# Patient Record
Sex: Female | Born: 1970 | Race: White | Hispanic: No | Marital: Single | Smoking: Former smoker
Health system: Southern US, Community
[De-identification: ages and names within clinical notes are randomized; demographics above are authoritative.]

## PROBLEM LIST (undated history)

## (undated) ENCOUNTER — Emergency Department (HOSPITAL_COMMUNITY): Payer: Medicare HMO | Source: Home / Self Care

## (undated) DIAGNOSIS — M797 Fibromyalgia: Secondary | ICD-10-CM

## (undated) DIAGNOSIS — M1990 Unspecified osteoarthritis, unspecified site: Secondary | ICD-10-CM

## (undated) DIAGNOSIS — R51 Headache: Secondary | ICD-10-CM

## (undated) DIAGNOSIS — F4320 Adjustment disorder, unspecified: Secondary | ICD-10-CM

## (undated) DIAGNOSIS — N840 Polyp of corpus uteri: Secondary | ICD-10-CM

## (undated) DIAGNOSIS — Z3490 Encounter for supervision of normal pregnancy, unspecified, unspecified trimester: Secondary | ICD-10-CM

## (undated) DIAGNOSIS — J45909 Unspecified asthma, uncomplicated: Secondary | ICD-10-CM

## (undated) HISTORY — PX: OTHER SURGICAL HISTORY: SHX169

## (undated) HISTORY — PX: WISDOM TOOTH EXTRACTION: SHX21

## (undated) HISTORY — DX: Polyp of corpus uteri: N84.0

---

## 2001-05-25 ENCOUNTER — Encounter: Payer: Self-pay | Admitting: Orthopedic Surgery

## 2001-05-25 ENCOUNTER — Ambulatory Visit (HOSPITAL_COMMUNITY): Admission: RE | Admit: 2001-05-25 | Discharge: 2001-05-25 | Payer: Self-pay | Admitting: Orthopedic Surgery

## 2001-06-10 ENCOUNTER — Encounter (HOSPITAL_COMMUNITY): Admission: RE | Admit: 2001-06-10 | Discharge: 2001-07-10 | Payer: Self-pay | Admitting: Rheumatology

## 2001-08-05 ENCOUNTER — Encounter (HOSPITAL_COMMUNITY): Admission: RE | Admit: 2001-08-05 | Discharge: 2001-09-04 | Payer: Self-pay | Admitting: Rheumatology

## 2001-10-01 ENCOUNTER — Emergency Department (HOSPITAL_COMMUNITY): Admission: EM | Admit: 2001-10-01 | Discharge: 2001-10-01 | Payer: Self-pay | Admitting: Emergency Medicine

## 2001-11-25 ENCOUNTER — Other Ambulatory Visit: Admission: RE | Admit: 2001-11-25 | Discharge: 2001-11-25 | Payer: Self-pay | Admitting: Obstetrics and Gynecology

## 2002-02-05 ENCOUNTER — Ambulatory Visit (HOSPITAL_COMMUNITY): Admission: AD | Admit: 2002-02-05 | Discharge: 2002-02-05 | Payer: Self-pay | Admitting: Obstetrics and Gynecology

## 2002-03-03 ENCOUNTER — Encounter (HOSPITAL_COMMUNITY): Admission: RE | Admit: 2002-03-03 | Discharge: 2002-04-02 | Payer: Self-pay | Admitting: Rheumatology

## 2002-04-28 ENCOUNTER — Inpatient Hospital Stay (HOSPITAL_COMMUNITY): Admission: AD | Admit: 2002-04-28 | Discharge: 2002-05-02 | Payer: Self-pay | Admitting: Obstetrics and Gynecology

## 2002-05-04 ENCOUNTER — Ambulatory Visit (HOSPITAL_COMMUNITY): Admission: RE | Admit: 2002-05-04 | Discharge: 2002-05-04 | Payer: Self-pay | Admitting: Internal Medicine

## 2002-05-04 ENCOUNTER — Encounter: Payer: Self-pay | Admitting: Obstetrics and Gynecology

## 2002-06-30 ENCOUNTER — Encounter (HOSPITAL_COMMUNITY): Admission: RE | Admit: 2002-06-30 | Discharge: 2002-07-30 | Payer: Self-pay | Admitting: Rheumatology

## 2002-07-01 ENCOUNTER — Encounter: Payer: Self-pay | Admitting: Rheumatology

## 2002-11-15 ENCOUNTER — Emergency Department (HOSPITAL_COMMUNITY): Admission: EM | Admit: 2002-11-15 | Discharge: 2002-11-15 | Payer: Self-pay | Admitting: *Deleted

## 2003-01-14 ENCOUNTER — Emergency Department (HOSPITAL_COMMUNITY): Admission: EM | Admit: 2003-01-14 | Discharge: 2003-01-14 | Payer: Self-pay | Admitting: Emergency Medicine

## 2003-06-19 ENCOUNTER — Emergency Department (HOSPITAL_COMMUNITY): Admission: EM | Admit: 2003-06-19 | Discharge: 2003-06-19 | Payer: Self-pay | Admitting: Emergency Medicine

## 2003-11-10 ENCOUNTER — Emergency Department (HOSPITAL_COMMUNITY): Admission: EM | Admit: 2003-11-10 | Discharge: 2003-11-10 | Payer: Self-pay | Admitting: Emergency Medicine

## 2003-11-25 ENCOUNTER — Emergency Department (HOSPITAL_COMMUNITY): Admission: EM | Admit: 2003-11-25 | Discharge: 2003-11-26 | Payer: Self-pay | Admitting: Emergency Medicine

## 2004-07-11 ENCOUNTER — Inpatient Hospital Stay (HOSPITAL_COMMUNITY): Admission: RE | Admit: 2004-07-11 | Discharge: 2004-07-14 | Payer: Self-pay | Admitting: Obstetrics and Gynecology

## 2005-03-01 ENCOUNTER — Emergency Department (HOSPITAL_COMMUNITY): Admission: EM | Admit: 2005-03-01 | Discharge: 2005-03-01 | Payer: Self-pay | Admitting: Emergency Medicine

## 2005-07-01 ENCOUNTER — Emergency Department (HOSPITAL_COMMUNITY): Admission: EM | Admit: 2005-07-01 | Discharge: 2005-07-01 | Payer: Self-pay | Admitting: Emergency Medicine

## 2006-05-18 ENCOUNTER — Emergency Department (HOSPITAL_COMMUNITY): Admission: EM | Admit: 2006-05-18 | Discharge: 2006-05-18 | Payer: Self-pay | Admitting: Emergency Medicine

## 2007-08-11 ENCOUNTER — Other Ambulatory Visit: Admission: RE | Admit: 2007-08-11 | Discharge: 2007-08-11 | Payer: Self-pay | Admitting: Obstetrics & Gynecology

## 2007-09-02 ENCOUNTER — Emergency Department (HOSPITAL_COMMUNITY): Admission: EM | Admit: 2007-09-02 | Discharge: 2007-09-02 | Payer: Self-pay | Admitting: Emergency Medicine

## 2007-10-29 ENCOUNTER — Emergency Department (HOSPITAL_COMMUNITY): Admission: EM | Admit: 2007-10-29 | Discharge: 2007-10-29 | Payer: Self-pay | Admitting: Emergency Medicine

## 2007-11-09 ENCOUNTER — Emergency Department (HOSPITAL_COMMUNITY): Admission: EM | Admit: 2007-11-09 | Discharge: 2007-11-09 | Payer: Self-pay | Admitting: Emergency Medicine

## 2007-11-15 ENCOUNTER — Other Ambulatory Visit: Admission: RE | Admit: 2007-11-15 | Discharge: 2007-11-15 | Payer: Self-pay | Admitting: Obstetrics & Gynecology

## 2008-05-29 ENCOUNTER — Ambulatory Visit: Payer: Self-pay | Admitting: Obstetrics & Gynecology

## 2008-05-29 ENCOUNTER — Inpatient Hospital Stay (HOSPITAL_COMMUNITY): Admission: RE | Admit: 2008-05-29 | Discharge: 2008-06-02 | Payer: Self-pay | Admitting: Obstetrics & Gynecology

## 2008-05-29 ENCOUNTER — Encounter: Payer: Self-pay | Admitting: Obstetrics & Gynecology

## 2008-06-02 ENCOUNTER — Ambulatory Visit: Payer: Self-pay | Admitting: Vascular Surgery

## 2008-06-02 ENCOUNTER — Encounter: Payer: Self-pay | Admitting: Obstetrics & Gynecology

## 2008-06-09 ENCOUNTER — Encounter: Admission: RE | Admit: 2008-06-09 | Discharge: 2008-07-09 | Payer: Self-pay | Admitting: Obstetrics & Gynecology

## 2008-07-10 ENCOUNTER — Encounter: Admission: RE | Admit: 2008-07-10 | Discharge: 2008-08-09 | Payer: Self-pay | Admitting: Obstetrics & Gynecology

## 2008-07-23 ENCOUNTER — Emergency Department (HOSPITAL_COMMUNITY): Admission: EM | Admit: 2008-07-23 | Discharge: 2008-07-23 | Payer: Self-pay | Admitting: Emergency Medicine

## 2008-08-10 ENCOUNTER — Encounter: Admission: RE | Admit: 2008-08-10 | Discharge: 2008-09-08 | Payer: Self-pay | Admitting: Obstetrics & Gynecology

## 2008-09-09 ENCOUNTER — Encounter: Admission: RE | Admit: 2008-09-09 | Discharge: 2008-10-09 | Payer: Self-pay | Admitting: Obstetrics & Gynecology

## 2008-10-10 ENCOUNTER — Encounter: Admission: RE | Admit: 2008-10-10 | Discharge: 2008-11-09 | Payer: Self-pay | Admitting: Obstetrics & Gynecology

## 2009-02-05 ENCOUNTER — Inpatient Hospital Stay (HOSPITAL_COMMUNITY): Admission: AD | Admit: 2009-02-05 | Discharge: 2009-02-05 | Payer: Self-pay | Admitting: Obstetrics & Gynecology

## 2009-03-30 ENCOUNTER — Other Ambulatory Visit: Admission: RE | Admit: 2009-03-30 | Discharge: 2009-03-30 | Payer: Self-pay | Admitting: Obstetrics and Gynecology

## 2009-05-18 ENCOUNTER — Inpatient Hospital Stay (HOSPITAL_COMMUNITY): Admission: RE | Admit: 2009-05-18 | Discharge: 2009-05-21 | Payer: Self-pay | Admitting: Obstetrics and Gynecology

## 2009-10-30 ENCOUNTER — Emergency Department (HOSPITAL_COMMUNITY): Admission: EM | Admit: 2009-10-30 | Discharge: 2009-10-30 | Payer: Self-pay | Admitting: Emergency Medicine

## 2009-11-06 ENCOUNTER — Inpatient Hospital Stay (HOSPITAL_COMMUNITY): Admission: EM | Admit: 2009-11-06 | Discharge: 2009-11-09 | Payer: Self-pay | Admitting: Emergency Medicine

## 2009-11-30 ENCOUNTER — Emergency Department (HOSPITAL_COMMUNITY): Admission: EM | Admit: 2009-11-30 | Discharge: 2009-11-30 | Payer: Self-pay | Admitting: Emergency Medicine

## 2010-01-16 ENCOUNTER — Emergency Department (HOSPITAL_COMMUNITY): Admission: EM | Admit: 2010-01-16 | Discharge: 2010-01-16 | Payer: Self-pay | Admitting: Emergency Medicine

## 2010-04-01 ENCOUNTER — Emergency Department (HOSPITAL_COMMUNITY): Admission: EM | Admit: 2010-04-01 | Discharge: 2010-04-01 | Payer: Self-pay | Admitting: Emergency Medicine

## 2010-07-21 ENCOUNTER — Emergency Department (HOSPITAL_COMMUNITY): Admission: EM | Admit: 2010-07-21 | Discharge: 2010-07-22 | Payer: Self-pay | Admitting: Emergency Medicine

## 2010-08-27 ENCOUNTER — Emergency Department (HOSPITAL_COMMUNITY): Admission: EM | Admit: 2010-08-27 | Discharge: 2010-08-27 | Payer: Self-pay | Admitting: Emergency Medicine

## 2010-09-13 ENCOUNTER — Emergency Department (HOSPITAL_COMMUNITY)
Admission: EM | Admit: 2010-09-13 | Discharge: 2010-09-13 | Payer: Self-pay | Source: Home / Self Care | Admitting: Emergency Medicine

## 2010-12-16 LAB — CBC
HCT: 40.1 % (ref 36.0–46.0)
MCH: 30.2 pg (ref 26.0–34.0)
MCV: 87 fL (ref 78.0–100.0)
Platelets: 165 10*3/uL (ref 150–400)
RDW: 12.6 % (ref 11.5–15.5)

## 2010-12-16 LAB — DIFFERENTIAL
Basophils Absolute: 0 10*3/uL (ref 0.0–0.1)
Eosinophils Absolute: 0.2 10*3/uL (ref 0.0–0.7)
Eosinophils Relative: 4 % (ref 0–5)
Lymphs Abs: 1.6 10*3/uL (ref 0.7–4.0)
Monocytes Absolute: 0.4 10*3/uL (ref 0.1–1.0)

## 2010-12-16 LAB — BASIC METABOLIC PANEL
BUN: 5 mg/dL — ABNORMAL LOW (ref 6–23)
CO2: 29 mEq/L (ref 19–32)
Chloride: 104 mEq/L (ref 96–112)
Creatinine, Ser: 0.72 mg/dL (ref 0.4–1.2)
Potassium: 4 mEq/L (ref 3.5–5.1)

## 2010-12-18 LAB — RAPID STREP SCREEN (MED CTR MEBANE ONLY): Streptococcus, Group A Screen (Direct): NEGATIVE

## 2010-12-25 LAB — BASIC METABOLIC PANEL
BUN: 7 mg/dL (ref 6–23)
BUN: 9 mg/dL (ref 6–23)
CO2: 27 mEq/L (ref 19–32)
CO2: 27 mEq/L (ref 19–32)
Chloride: 103 mEq/L (ref 96–112)
Chloride: 107 mEq/L (ref 96–112)
Creatinine, Ser: 0.69 mg/dL (ref 0.4–1.2)
Creatinine, Ser: 0.75 mg/dL (ref 0.4–1.2)
GFR calc Af Amer: >60 mL/min (ref >60)
Glucose, Bld: 101 mg/dL — ABNORMAL HIGH (ref 70–99)
Glucose, Bld: 98 mg/dL (ref 70–99)
Potassium: 3.6 mEq/L (ref 3.5–5.1)
Potassium: 3.8 mEq/L (ref 3.5–5.1)
Sodium: 137 mEq/L (ref 135–145)
Sodium: 140 mEq/L (ref 135–145)

## 2010-12-25 LAB — CBC
HCT: 36.5 % (ref 36.0–46.0)
HCT: 42.6 % (ref 36.0–46.0)
Hemoglobin: 11.9 g/dL — ABNORMAL LOW (ref 12.0–15.0)
Hemoglobin: 12.4 g/dL (ref 12.0–15.0)
MCHC: 33.8 g/dL (ref 30.0–36.0)
MCHC: 34 g/dL (ref 30.0–36.0)
MCV: 88.2 fL (ref 78.0–100.0)
MCV: 88.4 fL (ref 78.0–100.0)
Platelets: 174 10*3/uL (ref 150–400)
Platelets: 176 10*3/uL (ref 150–400)
RBC: 3.98 MIL/uL (ref 3.87–5.11)
RDW: 11.9 % (ref 11.5–15.5)
RDW: 12.2 % (ref 11.5–15.5)
WBC: 6.2 10*3/uL (ref 4.0–10.5)
WBC: 6.6 10*3/uL (ref 4.0–10.5)

## 2010-12-25 LAB — DIFFERENTIAL
Basophils Absolute: 0 10*3/uL (ref 0.0–0.1)
Basophils Absolute: 0 10*3/uL (ref 0.0–0.1)
Basophils Relative: 0 % (ref 0–1)
Basophils Relative: 1 % (ref 0–1)
Eosinophils Absolute: 0.1 10*3/uL (ref 0.0–0.7)
Eosinophils Absolute: 0.1 10*3/uL (ref 0.0–0.7)
Eosinophils Absolute: 0.1 10*3/uL (ref 0.0–0.7)
Eosinophils Relative: 1 % (ref 0–5)
Eosinophils Relative: 2 % (ref 0–5)
Lymphs Abs: 1.9 10*3/uL (ref 0.7–4.0)
Lymphs Abs: 2.3 10*3/uL (ref 0.7–4.0)
Monocytes Absolute: 0.3 10*3/uL (ref 0.1–1.0)
Monocytes Absolute: 0.3 10*3/uL (ref 0.1–1.0)
Monocytes Relative: 4 % (ref 3–12)
Neutro Abs: 4 10*3/uL (ref 1.7–7.7)
Neutrophils Relative %: 59 % (ref 43–77)
Neutrophils Relative %: 64 % (ref 43–77)

## 2010-12-25 LAB — RAPID URINE DRUG SCREEN, HOSP PERFORMED
Barbiturates: NOT DETECTED
Benzodiazepines: POSITIVE — AB
Cocaine: NOT DETECTED
Opiates: POSITIVE — AB

## 2010-12-25 LAB — CULTURE, BLOOD (ROUTINE X 2): Report Status: 2062011

## 2010-12-25 LAB — CULTURE, ROUTINE-ABSCESS: Culture: NO GROWTH

## 2011-01-11 LAB — CBC
MCHC: 34.3 g/dL (ref 30.0–36.0)
MCV: 89.8 fL (ref 78.0–100.0)
Platelets: 105 10*3/uL — ABNORMAL LOW (ref 150–400)
Platelets: 135 10*3/uL — ABNORMAL LOW (ref 150–400)
RBC: 3.84 MIL/uL — ABNORMAL LOW (ref 3.87–5.11)
RDW: 13 % (ref 11.5–15.5)
WBC: 8.4 10*3/uL (ref 4.0–10.5)

## 2011-01-11 LAB — TYPE AND SCREEN
ABO/RH(D): O NEG
DAT, IgG: NEGATIVE

## 2011-01-11 LAB — RPR: RPR Ser Ql: NONREACTIVE

## 2011-01-14 LAB — URINALYSIS, ROUTINE W REFLEX MICROSCOPIC
Glucose, UA: NEGATIVE mg/dL
Ketones, ur: NEGATIVE mg/dL
Nitrite: NEGATIVE
pH: 8 (ref 5.0–8.0)

## 2011-02-18 NOTE — Discharge Summary (Signed)
[name], [name]                 ACCOUNT NO.:  [id number]   MEDICAL RECORD NO.:  [id number]          PATIENT TYPE:  INP   LOCATION:  9139                          [facility]   PHYSICIAN:  Norton Blizzard, MD    DATE OF BIRTH:  January 22, 1971   DATE OF ADMISSION:  05/29/2008  DATE OF DISCHARGE:  06/02/2008                               DISCHARGE SUMMARY   REASON FOR ADMISSION:  Cesarean section.   DISCHARGE DIAGNOSIS:  Delivery.   PROCEDURES DONE:  Prenatal nonstress test and ultrasound.   PROCEDURES DONE INTRAPARTUM:  Low-transverse cesarean section.   POSTPARTUM PROCEDURES:  06/02/08 Bilateral lower extremity Doppler  ultrasound which were negative for DVT.   COMPLICATIONS:  Operative and postpartum: the patient had incisional  site cellulitis.   DISCHARGE DIAGNOSIS:  Term pregnancy, delivered.   BRIEF HOSPITAL COURSE:  This is a 40 year old G5, P3-0-2-3 at 39-week  gestational age, who presents for elective repeat low-transverse  cesarean section.  The patient delivered a viable female infant with  Apgars 8/9.  The patient developed incisional cellulitis on postop day  #2, treated with clindamycin 900 mg IV q.8 h. for 2 days.  Cellulitis  responded to antibiotic treatment with less edema and erythema.  On  discharge day, there was no observed erythema, discharge or induration.  Patient is to continue on antibiotics, Keflex 500 mg p.o., b.i.d. x 10  days; given a script of 20 tablets.  On postop day #3, the patient  developed right lower leg edema with positive Homans sign.  The patient  was put on treatment dose of Lovenox 150 mg subcu x1.  A bilateral lower  extremity Doppler ultrasound was done the next day which ruled out DVT.  Lovenox was discontinued.  The patient desired to be discharged on  postop day #4.  Once the DVT was ruled out and the patient was  hemodynamically stable, we thought it was prudent to grant the patient's  wish to discharge home.  The patient was  discharged home on postop day  #4 with instructions to return to [facility] if she has fever or  worsening pain in the right lower extremity.   DISCHARGE INFORMATION:   ACTIVITY:  Pelvic rest x6 weeks.   DIET:  Routine.   DISCHARGE MEDICATIONS:  1. Percocet 5/325 take 1-2 tablets q.4-6 h. p.r.n. pain, dispensed 20      tablets, no refills.  2. Ibuprofen 600 mg take 1 p.o., q.6 h. p.r.n. pain, dispensed 30, no      refills.  3. Keflex 500 mg p.o., b.i.d. x10 days, dispensed 20 tablets, no      refills.  4. Colace 100 mg p.o. b.i.d., p.r.n. postpartum constipation,      dispensed 30, no refills.  5. Prenatal vitamin, take 1 p.o. daily while breastfeeding, dispensed      30 with refills as needed.   STATUS:  Well.   INSTRUCTIONS:  Routine.  Discharged home.  Follow up in 3 days at [facility]  The patient has appointment on June 05, 2008, at 10:30 a.m.  NEWBORN DATA:  The patient delivered a viable female.  No circumcision at  this time.  The infant's weight was 7 pounds 11 ounces.  Newborn was  discharged home with mother with no complications.      Angeline Slim, MD  Electronically Signed     ______________________________  Norton Blizzard, MD    CT/MEDQ  D:  06/05/2008  T:  06/06/2008  Job:  [id number]

## 2011-02-18 NOTE — Op Note (Signed)
[name], [name]                 ACCOUNT NO.:  [id number]   MEDICAL RECORD NO.:  [id number]          PATIENT TYPE:  INP   LOCATION:  9139                          [facility]   PHYSICIAN:  Lazaro Arms, M.D.   DATE OF BIRTH:  1970/11/21   DATE OF PROCEDURE:  05/29/2008  DATE OF DISCHARGE:                               OPERATIVE REPORT   PREOPERATIVE DIAGNOSES:  1. Intrauterine pregnancy at [redacted] weeks gestational age.  2. History of cesarean section x2.   POSTOPERATIVE DIAGNOSES:  1. Intrauterine pregnancy at [redacted] weeks gestational age.  2. History of cesarean section x2.   PROCEDURE:  Repeat low-transverse cesarean section.   SURGEON:  Lazaro Arms, MD   ASSISTANT:  Odie Sera, DO   ANESTHESIA:  Spinal.   FINDINGS:  1. Viable female infant.  2. Clear amniotic fluid.   SPECIMENS:  Intact placenta with 3-vessel cord.   DISPOSITION:  To pathology.   INDICATIONS FOR PROCEDURE:  [name] is a 40 year old now gravida 7,  para 4-0-3-4 who presents for an elective cesarean section due to a  history of 2 prior cesarean sections.  She was previously extensively  counseled on the risks and benefits of cesarean section as well as  alternatives, and she desires to proceed with cesarean section.   DESCRIPTION OF PROCEDURE:  The patient was brought to the operating room  where spinal anesthesia was placed.  A time-out was conducted.  The  patient was prepped and draped in the usual sterile manner, and the  patient was placed in the left dorsal supine position.  Appropriate  anesthesia was confirmed.  A Pfannenstiel low-transverse incision was  made in the skin.  Incision was continued down to and through the  fascial layer with electrocautery and the fascial incision was extended  with scissors.  The fascial muscles were entered bluntly and spread.  The underlying rectus muscles were dissected up the peritoneum bluntly  as well.  A bladder blade was placed and a transverse  incision was made  in the uterus carefully through the layers of the uterine musculature  until the intact membranes were noted.  Membranes were ruptured with the  towel clamp and clear fluid was noted.  The head was delivered with the  assistance of a Kiwi vacuum.  The infant was bulb suctioned upon  delivery of the head.  The shoulders and rest of the body were delivered  in the usual manner.  A female infant was delivered with a spontaneous  cry.  Then, the cord was clamped and cut x2 and the baby was handed to  the NICU team.  The uterus was exteriorized and cleared of all clots and  debris using a moist lap.  The uterus was noted to be becoming firm.  The uterine incision was repaired with 0 Vicryl in a running locked  fashion.  A second layer of the same suture was used in an imbricating  fashion.  Excellent hemostasis was noted.  The muscular layers were  approximated loosely with 2-0 Vicryl suture.  Attention was then brought  to the fascial incision, which was closed in the usual manner with 0  Vicryl in running non-locking suture.  Hemostasis was again noted as  well as no defects noted in the fascial layer.  The skin was closed with  3-0 Vicryl in a subcuticular fashion.  Dermabond was applied over the  skin incision to better approximate the edges.  The patient tolerated  the procedure well.  Sponge, lap, instrument, and needle counts were  correct x2.  The patient was taken to the recovery room in good  condition.  Estimated blood loss was 700 mL.   COMPLICATIONS:  None immediate.      Odie Sera, DO  Electronically Signed     ______________________________  Lazaro Arms, M.D.    MC/MEDQ  D:  05/29/2008  T:  05/30/2008  Job:  [id number]

## 2011-02-18 NOTE — Discharge Summary (Signed)
[name], [name]                 ACCOUNT NO.:  [id number]   MEDICAL RECORD NO.:  [id number]          PATIENT TYPE:  INP   LOCATION:  9103                          [facility]   PHYSICIAN:  Tilda Burrow, M.D. DATE OF BIRTH:  Jan 06, 1971   DATE OF ADMISSION:  05/18/2009  DATE OF DISCHARGE:  05/21/2009                               DISCHARGE SUMMARY   REASON FOR ADMISSION:  Pregnancy at 39 weeks for repeat cesarean section  not for trial of labor.   POSTOPERATIVE DIAGNOSES:  Pregnancy at 39 weeks for repeat cesarean  section not for trial of labor.   The patient had an uncomplicated repeat low transverse cesarean section  with a viable female infant with Apgars of 9 and 9 per Dr. Emelda Fear.   Hospital course has been uneventful.  The patient does complain of  insomnia and after birth cramps, she is up ambulating well, taking p.o.  fluids and solids well.  The patient gassed rectally.   PHYSICAL EXAMINATION:  Today is as follows:  VITAL SIGNS:  Stable.  HEART:  Regular to rhythm and rate.  LUNGS:  Clear to auscultation bilaterally.  ABDOMEN:  Soft and nontender.  Bowel sounds are present x4.  Incision is  intact.  There is some erythema approximately 1.5 cm above the incision  with no warmth to touch.  EXTREMITIES:  No edema in the lower extremities.  GENITOURINARY:  Her fundus is firm and lochia small amount.   She [place] not had lengthy conversation in regards her discharge  medications.  She is requesting Ambien for sleep, due to the fact she  states she has not slept since she has been delivered.  Also, she states  that she will need a stronger doses of pain medication due to the fact  it takes 2 of the Percocets to get her eased out.  Also, she had not had  a lengthy conversation, and what I am going to do today is give her 5 of  Ambien to get her through until Thursday, and she can discuss with Dr.  Emelda Fear her pain management and her management of her insomnia and  her  antianxiety agents.   DISCHARGE MEDICATIONS:  As follows,  1. Motrin 600 p.o. q.6 h. p.r.n. cramping.  2. Keflex 500 t.i.d. for 7 days.  3. Colace b.i.d.  4. Ambien 10 p.o. at bedtime p.r.n. sleep.  5. Percocet 7.5/500 one p.o. q.4 h. p.r.n. pain.   She is to follow up Thursday with Dr. Rayna Sexton office for an incision  check and a blood pressure check.  She knows if she is to have any  problems, she will call Dr. Rayna Sexton office or she can come back to  our [facility]U Unit.      Zerita Boers, Lanier Clam      Tilda Burrow, M.D.  Electronically Signed    DL/MEDQ  D:  16/07/9603  T:  05/21/2009  Job:  [id number]   cc:   Tilda Burrow, M.D.  Fax: [phone]

## 2011-02-18 NOTE — Op Note (Signed)
Jamie, Farley                 ACCOUNT NO.:  [id number]   MEDICAL RECORD NO.:  [id number]          PATIENT TYPE:  INP   LOCATION:  9103                          [facility]   PHYSICIAN:  Tilda Burrow, M.D. DATE OF BIRTH:  [birth year]   DATE OF PROCEDURE:  05/18/2009  DATE OF DISCHARGE:                               OPERATIVE REPORT   ADMITTING DIAGNOSIS:  Pregnancy at 40 weeks' gestation, prior cesarean  section x3 declining, not for trial of labor.   POSTOPERATIVE DIAGNOSIS:  Pregnancy at 40 weeks' gestation, prior  cesarean section x3 declining, not for trial of labor, delivered.   PROCEDURE:  Repeat low-transverse cervical cesarean section.   SURGEON:  Tilda Burrow, MD   ASSISTANT:  None.   ANESTHESIA:  Spinal.   COMPLICATIONS:  None.   FINDINGS:  Healthy female infant, Apgars 9 and 9, weight __________, EBL  500 mL output.   URINE OUTPUT:  200 mL intraop.   INDICATIONS:  A 40 year old multiparous vaginal delivery followed by 3  prior cesareans desiring repeat cesarean section.  She is not ready to  make decisions on permanent sterilization.   DETAILS OF SURGICAL PROCEDURE:  The patient was taken to the operating  room, time-out conducted, Cleocin administered intravenously as  prophylaxis and abdomen was prepped and draped with Foley catheter,  without latex components placed.  A Pfannenstiel type incision was  repeated with wide excision of the old skin cicatrix removing a 20 cm  long x 5 x 4 cm wide ellipse of skin and underlying fatty tissue with  sharp dissection down to the fascia and opening of the fascia  transversely with dissection of the fascia off the underlying  musculature, which was quite atrophic, where it was adherent to the  fascia.  The peritoneal cavity was opened in the midline.  The  peritoneal surfaces were quite thickened, but there were no  intraperitoneal adhesions to the anterior abdominal wall.  The incision  was opened  obliquely from right lower side at 45-degree angled cephalad  into the upper left and bladder flap developed on the lower uterine  segment.  Alexis wound retractor was in place.  Bladder flap was  developed and transverse uterine incision made, lightly discolored  amniotic fluid encountered without particulate material.  The baby was  guided into the incision, fundal pressure applied, and baby delivered  easily with vigorous crying.  Nuchal cord x1 was encountered and  released easily.  Cord was clamped, infant passed to the awaiting  pediatrician.  See his notes for further details regarding the infant.   Placenta delivered easily, spontaneously, Crede uterine massage  completing the delivery with membranes intact.  Uterine cavity was  inspected.  No membrane remnant suspected.  The uterus was then closed  with a single layer of running locking 0 chromic.  A bladder flap was  minimally reapproximated with 2 interrupted sutures of 2-0 Vicryl.  The  abdomen was inspected and removed of any fluid and blood clots and then  the anterior peritoneum reapproximated and pulled together with a series  of  interrupted 2-0 Vicryl sutures.  Point cautery was necessary on 2 or  3 spots on the peritoneal surfaces while the operators finger protected  the intraabdominal contents to complete hemostasis, then the fascia  could then be closed using running 0 Vicryl.  Subcu tissues were  mobilized inferiorly sufficiently to allow subcu reapproximation using 3  interrupted sutures of 2-0 Vicryl and subcuticular 3-0 Vicryl closure of  the skin incision completed the procedure.  Pressure dressing was placed  and the patient went to recovery room in good condition.  Sponge and  needle counts correct.      Tilda Burrow, M.D.  Electronically Signed     JVF/MEDQ  D:  05/18/2009  T:  05/19/2009  Job:  [id number]

## 2011-02-18 NOTE — H&P (Signed)
[name], [name]                 ACCOUNT NO.:  [id number]   MEDICAL RECORD NO.:  [id number]          PATIENT TYPE:  INP   LOCATION:                                [facility]   PHYSICIAN:  Tilda Burrow, M.D. DATE OF BIRTH:  1971/09/28   DATE OF ADMISSION:  05/18/2009  DATE OF DISCHARGE:                              HISTORY & PHYSICAL   ADMISSION DIAGNOSES:  Pregnancy [redacted] weeks gestation, prior cesarean  section x3, not for trial of labor.   HISTORY OF PRESENT ILLNESS:  This 40 year old female gravida 6, para 4-0-  1-4 with LMP uncertain with ultrasound assigned EDC of May 25, 2009  based on 11-week 5-day ultrasound on November 08, 2008 with corresponding  ultrasound at 21 weeks is admitted at 96 weeks' gestation by best  ultrasound criteria for repeat cesarean section.  Prior pregnancies have  resulted in vaginal delivery in 1989 and cesarean sections 2003, 2005,  and 2009.  She has not made any decisions regarding permanent  sterilization and is very ambiguous in her comments about future  contraception plans.  At one time, she discussed tubal sterilization  __if the baby was a girl___, but has apprehensions about its effect on  her body.   PAST MEDICAL HISTORY:  Positive for acid reflux, chronic vulvar  discomforts, and arthritis.  She has fibromyalgia by history.  She  smokes less than 1-pack per day.  Denies alcohol or recreational drugs.  She had cesarean x3.   Prenatal course has been notable for a low pain tolerance throughout the  pregnancy.  At one time, she was on some hydrocodone for discomforts.  Currently, she is using an abdominal binder for day to day ambulation.  Additionally review of the pregnancy has been notable for the patient  concerns over 2 small areas of chronic per irritation on her scalp which  are for possibly consistent with psoriasis.   Family history is positive for skin disorders as well as diabetes,  hypertension, and heart disease.   Additionally, prenatal course has been notable for blood type O  negative, antibody screen negative.  Urine drug screen positive for THC  initially.  Blood tests are show varicella immunity, rubella immunity.  Hemoglobin 13, hematocrit 42 initially reducing to 12.8 and 39.6 at 28  weeks.  Glucose tolerance test in 1995 and first trimester January 2008  at 28 weeks'.  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   Recently in the pregnancy, she has been taking capillary blood glucoses  and has had 2 abnormal values in the 160 range recently.  Ultrasound for  estimated fetal weight within 1 standard deviation of normal.  GC and  Chlamydia cultures are negative.  Group B strep was collected on May 07, 2009.   PHYSICAL EXAMINATION:  GENERAL:  Shows a healthy-appearing Caucasian  female alert and oriented x3.  EYES:  Pupils equal, round, and reactive.  NECK:  Supple.  CHEST:  Clear to auscultation.  ABDOMEN:  Well-healed lower abdominal  scar with perceived lower abdominal discomforts to palpation,  but no  hernias or masses present.  Term size fetus, vertex presentation  consistent with gestational age.  GC and Chlamydia cultures performed.  Cervix visually closed and posterior on speculum exam.  EXTREMITIES:  Grossly normal without cyanosis, clubbing, or edema.   ASSESSMENT:  Pregnancy at 39 weeks, repeat cesarean, and tubal ligation.   Addendum:  Allergies to PENICILLIN and ASPIRIN.   PLAN:  Repeat cesarean section May 18, 2009 at 1:30 p.m.      Tilda Burrow, M.D.  Electronically Signed     JVF/MEDQ  D:  05/07/2009  T:  05/08/2009  Job:  [id number]

## 2011-02-18 NOTE — H&P (Signed)
NAMESHARON, [name]                 ACCOUNT NO.:  [id number]   MEDICAL RECORD NO.:  [id number]          PATIENT TYPE:  [facility]   LOCATION:                                [facility]   PHYSICIAN:  Tilda Burrow, M.D. DATE OF BIRTH:  [birth year]   DATE OF ADMISSION:  05/29/2008  DATE OF DISCHARGE:                              HISTORY & PHYSICAL   ADMITTING DIAGNOSES:  Pregnancy at 43 weeks' gestation, prior C-section  x2, now for trial of labor.   HISTORY OF PRESENT ILLNESS:  This 40 year old female gravida 5, para 7-3-  0-3-3 now at 88 weeks' gestation with [facility] June 06, 2008 and June 02, 2008, based on a 21-week ultrasound is admitted at 72 weeks'  gestation on May 29, 2008, for 7:30 a.m. C-section.  THIS WILL BE  PERFORMED BY DR. Despina Hidden.  The patient has been posted for surgery.  Prenatal course has been followed through [facility] since  January 2009 with 2 ultrasounds making up the [facility].   PAST MEDICAL HISTORY:  Benign.   SURGICAL HISTORY:  C-section x2.   ALLERGIES:  ASPIRIN and PENICILLIN.  Latex is not a problem.   Menses are regular, though she does not know exactly when her last  period was.   PHYSICAL EXAMINATION:  GENERAL:  She is a healthy Caucasian female.  VITAL SIGNS:  Height 5 feet 9 inches, weight 187, and blood pressure  120/80.  HEENT:  Pupils equal, round, and reactive.  Extraocular movements  intact.  NECK:  Supple.  CHEST:  Clear to auscultation.  ABDOMEN:  Nontender.  Gravid uterus, 36 cm fundal height.  Estimated  fetal weight 7.5 to 8 pounds.   Prenatal labs are as follows:  Blood type is O negative.  Antibody screen negative.  RhoGAM  administered on __________.  Rubella immune present.  Hepatitis, HIV,  RPR, GC, and chlamydia all negative.  Group B strep is not obtained due  to planned C-section.  Repeat HIV is negative on April 12, 2008, as is  negative RPR.   PLAN:  Repeat C-section without tubal ligation at 7:30 a.m., May 29, 2008, posting number [id number].      Tilda Burrow, M.D.  Electronically Signed    JVF/MEDQ  D:  05/16/2008  T:  05/17/2008  Job:  [id number]   cc:   [facility]
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
## 2011-06-26 LAB — URINALYSIS, ROUTINE W REFLEX MICROSCOPIC
Glucose, UA: NEGATIVE
Nitrite: NEGATIVE
Specific Gravity, Urine: 1.015
pH: 7

## 2011-06-26 LAB — CBC
Platelets: 158
RBC: 4.01
WBC: 13.2 — ABNORMAL HIGH

## 2011-06-26 LAB — BASIC METABOLIC PANEL
BUN: 5 — ABNORMAL LOW
Chloride: 103
Creatinine, Ser: 0.62
GFR calc Af Amer: >60
GFR calc non Af Amer: >60
Potassium: 3.3 — ABNORMAL LOW

## 2011-06-26 LAB — DIFFERENTIAL
Lymphocytes Relative: 11 — ABNORMAL LOW
Lymphs Abs: 1.4
Monocytes Relative: 3
Neutrophils Relative %: 85 — ABNORMAL HIGH

## 2011-06-26 LAB — PREGNANCY, URINE: Preg Test, Ur: POSITIVE

## 2011-06-27 LAB — URINALYSIS, ROUTINE W REFLEX MICROSCOPIC
Bilirubin Urine: NEGATIVE
Nitrite: NEGATIVE
Specific Gravity, Urine: >1.03 — ABNORMAL HIGH
Urobilinogen, UA: 0.2

## 2011-07-15 LAB — PREGNANCY, URINE: Preg Test, Ur: NEGATIVE

## 2011-07-15 LAB — CBC
HCT: 42.3
MCV: 91.7
Platelets: 160
WBC: 9

## 2011-07-15 LAB — DIFFERENTIAL
Basophils Absolute: 0
Lymphocytes Relative: 7 — ABNORMAL LOW
Neutro Abs: 8.2 — ABNORMAL HIGH

## 2011-07-15 LAB — COMPREHENSIVE METABOLIC PANEL
Albumin: 3.8
BUN: 18
CO2: 27
Chloride: 102
Creatinine, Ser: 0.75
GFR calc non Af Amer: >60
Total Bilirubin: 1.3 — ABNORMAL HIGH

## 2011-07-15 LAB — URINALYSIS, ROUTINE W REFLEX MICROSCOPIC
Nitrite: NEGATIVE
Protein, ur: 30 — AB
Urobilinogen, UA: 1

## 2011-07-15 LAB — URINE MICROSCOPIC-ADD ON

## 2011-07-15 LAB — LIPASE, BLOOD: Lipase: 36

## 2011-08-20 ENCOUNTER — Encounter: Payer: Self-pay | Admitting: *Deleted

## 2011-08-20 ENCOUNTER — Emergency Department (HOSPITAL_COMMUNITY)
Admission: EM | Admit: 2011-08-20 | Discharge: 2011-08-20 | Disposition: A | Discharge status: Home / Self Care | Payer: Medicare Other | Attending: Emergency Medicine | Admitting: Emergency Medicine

## 2011-08-20 DIAGNOSIS — K089 Disorder of teeth and supporting structures, unspecified: Secondary | ICD-10-CM | POA: Insufficient documentation

## 2011-08-20 DIAGNOSIS — R6883 Chills (without fever): Secondary | ICD-10-CM | POA: Insufficient documentation

## 2011-08-20 DIAGNOSIS — K029 Dental caries, unspecified: Secondary | ICD-10-CM | POA: Insufficient documentation

## 2011-08-20 DIAGNOSIS — R51 Headache: Secondary | ICD-10-CM | POA: Insufficient documentation

## 2011-08-20 DIAGNOSIS — S025XXA Fracture of tooth (traumatic), initial encounter for closed fracture: Secondary | ICD-10-CM | POA: Insufficient documentation

## 2011-08-20 DIAGNOSIS — H9209 Otalgia, unspecified ear: Secondary | ICD-10-CM | POA: Insufficient documentation

## 2011-08-20 DIAGNOSIS — X58XXXA Exposure to other specified factors, initial encounter: Secondary | ICD-10-CM | POA: Insufficient documentation

## 2011-08-20 MED ORDER — OXYCODONE-ACETAMINOPHEN 5-325 MG PO TABS
1.0000 | ORAL_TABLET | Freq: Once | ORAL | Status: AC
  Administered 2011-08-20: 1 via ORAL
  Filled 2011-08-20: qty 1

## 2011-08-20 MED ORDER — OXYCODONE-ACETAMINOPHEN 5-325 MG PO TABS: 1.0000 | ORAL_TABLET | Freq: Four times a day (QID) | ORAL | Status: AC | PRN

## 2011-08-20 MED ORDER — CLINDAMYCIN HCL 150 MG PO CAPS: 450.0000 mg | ORAL_CAPSULE | Freq: Three times a day (TID) | ORAL | Status: AC

## 2011-08-20 NOTE — ED Notes (Signed)
Reports broken tooth and pain left upper, airway intact.

## 2011-08-20 NOTE — ED Provider Notes (Signed)
History     CSN: [id number] Arrival date & time: 08/20/2011 11:02 AM   First MD Initiated Contact with Patient 08/20/11 1252      Chief Complaint  Patient presents with  . Dental Pain  . Headache    (Consider location/radiation/quality/duration/timing/severity/associated sxs/prior treatment) Patient is a 40 y.o. female presenting with tooth pain. The history is provided by the patient.  Dental PainThe primary symptoms include mouth pain, dental injury and headaches. Primary symptoms do not include fever or sore throat.  The headache is associated with eye pain. The headache is not associated with neck stiffness.  Additional symptoms include: ear pain. Additional symptoms do not include: facial swelling, trouble swallowing, drooling, hearing loss and nosebleeds.   the patient reports pain to the left upper first molar. She states that a filling to this tooth fell out 4 or 5 days ago, and while she was eating breakfast this morning she felt the tooth break in the middle and has been experiencing severe shooting pain since that time. She has associated left ear, left facial, and left-sided head pain. She reports intermittent chills over the last several days but no definitive fever. She has had no dizziness lightheadedness change in vision change in hearing difficulty swallowing or neck pain. She reports overall poor dental health due to lack of dental insurance and insufficient funds to pay for a visit to the dentist. She has tried Tylenol to relieve the pain with minimal relief.  History reviewed. No pertinent past medical history.  History reviewed. No pertinent past surgical history.  History reviewed. No pertinent family history.  History  Substance Use Topics  . Smoking status: Current Everyday Smoker -- 0.5 packs/day    Types: Cigarettes  . Smokeless tobacco: Not on file  . Alcohol Use: No   Review of Systems  Constitutional: Positive for chills. Negative for fever and  diaphoresis.  HENT: Positive for ear pain and dental problem. Negative for hearing loss, nosebleeds, congestion, sore throat, facial swelling, rhinorrhea, sneezing, drooling, mouth sores, trouble swallowing, neck pain, neck stiffness, postnasal drip, tinnitus and ear discharge.   Eyes: Positive for pain. Negative for discharge, redness and visual disturbance.  Neurological: Positive for headaches.  All other systems reviewed and are negative.    Allergies  Aspirin and Penicillins  Home Medications   Current Outpatient Rx  Name Route Sig Dispense Refill  . ACETAMINOPHEN 500 MG PO TABS Oral Take 1,000 mg by mouth every 6 (six) hours as needed. For pain       BP 141/82  Pulse 64  Temp(Src) 97.8 F (36.6 C) (Oral)  Resp 18  SpO2 99%  LMP 08/06/2011  Physical Exam  Constitutional: She is oriented to person, place, and time. She appears well-developed and well-nourished. She appears distressed.  HENT:  Head: Normocephalic and atraumatic.  Right Ear: Hearing, tympanic membrane, external ear and ear canal normal.  Left Ear: Hearing, tympanic membrane, external ear and ear canal normal.  Nose: Nose normal.  Mouth/Throat: Oropharynx is clear and moist. No oropharyngeal exudate.         The patient has generalized poor dentition with all premolars and molars showing signs of dental caries. There is no apparent abscess or other lesion inside the oral cavity. There are multiple missing teeth.  Eyes: EOM are normal. Pupils are equal, round, and reactive to light.  Neck: Normal range of motion. Neck supple.  Cardiovascular: Normal rate and regular rhythm.   Pulmonary/Chest: Effort normal and breath sounds normal.  Abdominal: Soft. She exhibits no distension. There is no tenderness.  Musculoskeletal: Normal range of motion. She exhibits no edema and no tenderness.  Lymphadenopathy:    She has no cervical adenopathy.  Neurological: She is alert and oriented to person, place, and time. No  cranial nerve deficit. Coordination normal.  Skin: Skin is warm and dry. No rash noted.    ED Course  Procedures (including critical care time)  Labs Reviewed - No data to display No results found.   1. Dental fracture       MDM  Patient with poor dentition with a fractured tooth. I feel her other complaints of pain to include the left side of the head of the left ear and left eye are all secondary to the dental pain. Her physical exam is benign with motor function and neurologic status grossly intact other than her dentition. She is allergic to penicillin, we will start her on clindamycin. I have strongly recommended that she followup with the oral surgeon on call for today for likely extraction of the affected tooth. There is also a free dental clinic in the next few days and I will notify patient about.  Medical screening examination/treatment/procedure(s) were performed by non-physician practitioner and as supervising physician I was immediately available for consultation/collaboration. Osvaldo Human, M.D.       Elwyn Reach [place], [place] 08/20/11 1321  Carleene Cooper III, MD 08/20/11 [id number]

## 2011-09-14 ENCOUNTER — Emergency Department (HOSPITAL_COMMUNITY)
Admission: EM | Admit: 2011-09-14 | Discharge: 2011-09-14 | Disposition: A | Discharge status: Home / Self Care | Payer: Medicaid Other | Attending: Emergency Medicine | Admitting: Emergency Medicine

## 2011-09-14 ENCOUNTER — Encounter (HOSPITAL_COMMUNITY): Payer: Self-pay

## 2011-09-14 DIAGNOSIS — B349 Viral infection, unspecified: Secondary | ICD-10-CM

## 2011-09-14 DIAGNOSIS — B9789 Other viral agents as the cause of diseases classified elsewhere: Secondary | ICD-10-CM | POA: Insufficient documentation

## 2011-09-14 DIAGNOSIS — F17200 Nicotine dependence, unspecified, uncomplicated: Secondary | ICD-10-CM | POA: Insufficient documentation

## 2011-09-14 MED ORDER — ONDANSETRON HCL 4 MG PO TABS: 4.0000 mg | ORAL_TABLET | Freq: Four times a day (QID) | ORAL | Status: AC

## 2011-09-14 MED ORDER — OXYCODONE-ACETAMINOPHEN 5-325 MG PO TABS
1.0000 | ORAL_TABLET | Freq: Once | ORAL | Status: AC
  Administered 2011-09-14: 1 via ORAL
  Filled 2011-09-14: qty 1

## 2011-09-14 MED ORDER — ONDANSETRON 8 MG PO TBDP
8.0000 mg | ORAL_TABLET | Freq: Once | ORAL | Status: AC
  Administered 2011-09-14: 8 mg via ORAL
  Filled 2011-09-14: qty 1

## 2011-09-14 MED ORDER — HYDROCOD POLST-CHLORPHEN POLST 10-8 MG/5ML PO LQCR: 5.0000 mL | Freq: Two times a day (BID) | ORAL | Status: DC | PRN

## 2011-09-14 NOTE — ED Provider Notes (Signed)
History     CSN: [id number] Arrival date & time: 09/14/2011  3:43 PM   First MD Initiated Contact with Patient 09/14/11 1601      Chief Complaint  Patient presents with  . Cough  . Emesis  . Headache  . Fever  . Generalized Body Aches    (Consider location/radiation/quality/duration/timing/severity/associated sxs/prior treatment) Patient is a 40 y.o. female presenting with flu symptoms. The history is provided by the patient.  Influenza This is a new problem. The current episode started in the past 7 days. The problem occurs constantly. The problem has been unchanged. Associated symptoms include chills, congestion, coughing, headaches, myalgias, nausea, a sore throat and vomiting. Pertinent negatives include no abdominal pain, chest pain, fever, neck pain, numbness, rash, urinary symptoms or weakness. The symptoms are aggravated by nothing. She has tried acetaminophen for the symptoms. The treatment provided mild relief.    History reviewed. No pertinent past medical history.  History reviewed. No pertinent past surgical history.  History reviewed. No pertinent family history.  History  Substance Use Topics  . Smoking status: Current Everyday Smoker -- 0.5 packs/day    Types: Cigarettes  . Smokeless tobacco: Not on file  . Alcohol Use: No    OB History    Grav Para Term Preterm Abortions TAB SAB Ect Mult Living                  Review of Systems  Constitutional: Positive for chills and appetite change. Negative for fever and activity change.  HENT: Positive for congestion and sore throat. Negative for neck pain and neck stiffness.   Eyes: Positive for photophobia. Negative for visual disturbance.  Respiratory: Positive for cough. Negative for shortness of breath and wheezing.   Cardiovascular: Negative for chest pain.  Gastrointestinal: Positive for nausea and vomiting. Negative for abdominal pain.  Genitourinary: Negative for dysuria.  Musculoskeletal: Positive for  myalgias.  Skin: Negative.  Negative for rash.  Neurological: Positive for headaches. Negative for dizziness, facial asymmetry, weakness and numbness.    Allergies  Aspirin and Penicillins  Home Medications   Current Outpatient Rx  Name Route Sig Dispense Refill  . ACETAMINOPHEN 500 MG PO TABS Oral Take 1,000 mg by mouth every 6 (six) hours as needed. For pain       BP 112/65  Pulse 86  Temp(Src) 99.4 F (37.4 C) (Oral)  Resp 20  Ht 5\' 6"  (1.676 m)  Wt 180 lb (81.647 kg)  BMI 29.05 kg/m2  SpO2 100%  LMP 09/03/2011  Physical Exam  Nursing note and vitals reviewed. Constitutional: She is oriented to person, place, and time. She appears well-developed and well-nourished. No distress.  HENT:  Head: Normocephalic and atraumatic.  Right Ear: Tympanic membrane and ear canal normal.  Left Ear: Tympanic membrane and ear canal normal.  Nose: Nose normal.  Mouth/Throat: Uvula is midline, oropharynx is clear and moist and mucous membranes are normal.  Neck: Normal range of motion. Neck supple.  Cardiovascular: Normal rate, regular rhythm and normal heart sounds.   Pulmonary/Chest: Effort normal and breath sounds normal. No respiratory distress. She has no wheezes. She has no rales.  Abdominal: Soft. She exhibits no distension. There is no tenderness.  Musculoskeletal: Normal range of motion. She exhibits no tenderness.  Lymphadenopathy:    She has no cervical adenopathy.  Neurological: She is alert and oriented to person, place, and time. She has normal reflexes. No cranial nerve deficit. She exhibits normal muscle tone. Coordination normal.  Skin:  Skin is warm and dry.    ED Course  Procedures (including critical care time)  Labs Reviewed - No data to display No results found.   No diagnosis found.    MDM    4:56 PM patient is feeling better,  Non-toxic appearing, airway patent, no trismus.  Non-toxic appearing.  BP improved on re-check. Reports vomiting at onset of  sx's but no vomiting during ed stay and tolerated fluids here.  Vitals improved.  Likely viral illness  Pt feels improved after observation and/or treatment in ED.         Khelani Kops L. Lakiesha Ralphs, PA 09/16/11 2250

## 2011-09-14 NOTE — ED Notes (Signed)
Pt presents with cough, fever, body aches, and vomiting since Wednesday. NAD at this time.

## 2011-09-17 NOTE — ED Provider Notes (Signed)
Medical screening examination/treatment/procedure(s) were performed by non-physician practitioner and as supervising physician I was immediately available for consultation/collaboration.   Ernesto Zukowski M Aubrea Meixner, DO 09/17/11 1510 

## 2011-11-20 ENCOUNTER — Other Ambulatory Visit: Payer: Self-pay | Admitting: Adult Health

## 2011-11-20 ENCOUNTER — Other Ambulatory Visit (HOSPITAL_COMMUNITY)
Admission: RE | Admit: 2011-11-20 | Discharge: 2011-11-20 | Disposition: A | Discharge status: Home / Self Care | Payer: Medicare Other | Source: Ambulatory Visit | Attending: Obstetrics and Gynecology | Admitting: Obstetrics and Gynecology

## 2011-11-20 DIAGNOSIS — Z113 Encounter for screening for infections with a predominantly sexual mode of transmission: Secondary | ICD-10-CM | POA: Insufficient documentation

## 2011-11-20 DIAGNOSIS — Z01419 Encounter for gynecological examination (general) (routine) without abnormal findings: Secondary | ICD-10-CM | POA: Insufficient documentation

## 2011-11-20 LAB — OB RESULTS CONSOLE RUBELLA ANTIBODY, IGM: Rubella: IMMUNE

## 2011-11-20 LAB — OB RESULTS CONSOLE HIV ANTIBODY (ROUTINE TESTING): HIV: NONREACTIVE

## 2012-05-01 ENCOUNTER — Encounter (HOSPITAL_COMMUNITY): Payer: Self-pay

## 2012-05-01 ENCOUNTER — Emergency Department (HOSPITAL_COMMUNITY)
Admission: EM | Admit: 2012-05-01 | Discharge: 2012-05-01 | Disposition: A | Discharge status: Home / Self Care | Payer: Medicare Other | Attending: Emergency Medicine | Admitting: Emergency Medicine

## 2012-05-01 DIAGNOSIS — K0889 Other specified disorders of teeth and supporting structures: Secondary | ICD-10-CM

## 2012-05-01 DIAGNOSIS — K029 Dental caries, unspecified: Secondary | ICD-10-CM | POA: Insufficient documentation

## 2012-05-01 DIAGNOSIS — F17200 Nicotine dependence, unspecified, uncomplicated: Secondary | ICD-10-CM | POA: Insufficient documentation

## 2012-05-01 HISTORY — DX: Encounter for supervision of normal pregnancy, unspecified, unspecified trimester: Z34.90

## 2012-05-01 MED ORDER — HYDROCODONE-ACETAMINOPHEN 5-325 MG PO TABS: ORAL_TABLET | ORAL | Status: AC

## 2012-05-01 MED ORDER — CLINDAMYCIN HCL 300 MG PO CAPS: 300.0000 mg | ORAL_CAPSULE | Freq: Four times a day (QID) | ORAL | Status: AC

## 2012-05-01 MED ORDER — HYDROCODONE-ACETAMINOPHEN 5-325 MG PO TABS
1.0000 | ORAL_TABLET | Freq: Once | ORAL | Status: AC
  Administered 2012-05-01: 1 via ORAL
  Filled 2012-05-01: qty 1

## 2012-05-01 MED ORDER — CLINDAMYCIN HCL 150 MG PO CAPS
300.0000 mg | ORAL_CAPSULE | Freq: Once | ORAL | Status: AC
  Administered 2012-05-01: 300 mg via ORAL
  Filled 2012-05-01: qty 2

## 2012-05-01 NOTE — ED Notes (Signed)
Left side of mouth toothache since last night

## 2012-05-07 NOTE — ED Provider Notes (Signed)
History     CSN: [id number]  Arrival date & time 05/01/12  1415   First MD Initiated Contact with Patient 05/01/12 1448      Chief Complaint  Patient presents with  . Dental Pain    (Consider location/radiation/quality/duration/timing/severity/associated sxs/prior treatment) Patient is a 41 y.o. female presenting with tooth pain. The history is provided by the patient.  Dental PainThe primary symptoms include mouth pain. Primary symptoms do not include dental injury, oral bleeding, oral lesions, headaches, fever, shortness of breath, sore throat, angioedema or cough. Episode onset: the day prior to ED arrival. The symptoms are worsening. The symptoms are new. The symptoms occur constantly.  Additional symptoms include: dental sensitivity to temperature and gum tenderness. Additional symptoms do not include: gum swelling, purulent gums, trismus, facial swelling, trouble swallowing, pain with swallowing, ear pain and swollen glands. Medical issues include: smoking and periodontal disease.    Past Medical History  Diagnosis Date  . Pregnancy     History reviewed. No pertinent past surgical history.  No family history on file.  History  Substance Use Topics  . Smoking status: Current Everyday Smoker -- 0.5 packs/day    Types: Cigarettes  . Smokeless tobacco: Not on file  . Alcohol Use: No    OB History    Grav Para Term Preterm Abortions TAB SAB Ect Mult Living                  Review of Systems  Constitutional: Negative for fever and appetite change.  HENT: Positive for dental problem. Negative for ear pain, congestion, sore throat, facial swelling, trouble swallowing, neck pain and neck stiffness.   Eyes: Negative for pain and visual disturbance.  Respiratory: Negative for cough and shortness of breath.   Neurological: Negative for dizziness, facial asymmetry and headaches.  Hematological: Negative for adenopathy.  All other systems reviewed and are  negative.    Allergies  Aspirin and Penicillins  Home Medications   Current Outpatient Rx  Name Route Sig Dispense Refill  . LANSOPRAZOLE 15 MG PO CPDR Oral Take 15 mg by mouth daily.    Marland Kitchen ONDANSETRON HCL 8 MG PO TABS Oral Take 8 mg by mouth every 8 (eight) hours as needed. For nausea    . PRENATAL 27-0.8 MG PO TABS Oral Take 1 tablet by mouth daily.    Marland Kitchen CLINDAMYCIN HCL 300 MG PO CAPS Oral Take 1 capsule (300 mg total) by mouth 4 (four) times daily. 40 capsule 0  . HYDROCODONE-ACETAMINOPHEN 5-325 MG PO TABS  Take one tab po q 4-6 hrs prn pain 15 tablet 0    BP 144/86  Pulse 96  Temp 97.7 F (36.5 C) (Oral)  Resp 20  Ht 5' 6.5" (1.689 m)  Wt 200 lb (90.719 kg)  BMI 31.80 kg/m2  SpO2 100%  LMP 09/06/2011  Physical Exam  Nursing note and vitals reviewed. Constitutional: She is oriented to person, place, and time. She appears well-developed and well-nourished. No distress.  HENT:  Head: Normocephalic and atraumatic. No trismus in the jaw.  Right Ear: Tympanic membrane and ear canal normal.  Left Ear: Tympanic membrane and ear canal normal.  Mouth/Throat: Uvula is midline, oropharynx is clear and moist and mucous membranes are normal. Dental caries present. No dental abscesses or uvula swelling.       No obvious dental abscess.  Several dental caries.    Neck: Normal range of motion. Neck supple.  Cardiovascular: Normal rate, regular rhythm and normal heart sounds.  No murmur heard. Pulmonary/Chest: Effort normal and breath sounds normal.  Musculoskeletal: Normal range of motion.  Lymphadenopathy:    She has no cervical adenopathy.  Neurological: She is alert and oriented to person, place, and time. She exhibits normal muscle tone. Coordination normal.  Skin: Skin is warm and dry.    ED Course  Procedures (including critical care time)  Labs Reviewed - No data to display No results found.   1. Pain, dental       MDM    Pt has dental decay of the left  premolars and molars.  No facial edema or trismus.  Airway clear. No obvious dental abscess  The patient appears reasonably screened and/or stabilized for discharge and I doubt any other medical condition or other [facility] requiring further screening, evaluation, or treatment in the ED at this time prior to discharge.    Prescribed: Clindamycin norco #15     Brunella Wileman L. Bianca Vester, [place] 05/07/12 1406

## 2012-05-12 NOTE — ED Provider Notes (Signed)
Medical screening examination/treatment/procedure(s) were performed by non-physician practitioner and as supervising physician I was immediately available for consultation/collaboration.  Donnetta Hutching, MD 05/12/12 1515

## 2012-05-28 ENCOUNTER — Emergency Department (HOSPITAL_COMMUNITY)
Admission: EM | Admit: 2012-05-28 | Discharge: 2012-05-28 | Disposition: A | Discharge status: Home / Self Care | Payer: Medicare Other | Attending: Emergency Medicine | Admitting: Emergency Medicine

## 2012-05-28 ENCOUNTER — Encounter (HOSPITAL_COMMUNITY): Payer: Self-pay | Admitting: *Deleted

## 2012-05-28 DIAGNOSIS — IMO0002 Reserved for concepts with insufficient information to code with codable children: Secondary | ICD-10-CM | POA: Insufficient documentation

## 2012-05-28 DIAGNOSIS — K089 Disorder of teeth and supporting structures, unspecified: Secondary | ICD-10-CM | POA: Insufficient documentation

## 2012-05-28 DIAGNOSIS — Z888 Allergy status to other drugs, medicaments and biological substances status: Secondary | ICD-10-CM | POA: Insufficient documentation

## 2012-05-28 DIAGNOSIS — Z331 Pregnant state, incidental: Secondary | ICD-10-CM | POA: Insufficient documentation

## 2012-05-28 DIAGNOSIS — K0889 Other specified disorders of teeth and supporting structures: Secondary | ICD-10-CM

## 2012-05-28 DIAGNOSIS — Z3490 Encounter for supervision of normal pregnancy, unspecified, unspecified trimester: Secondary | ICD-10-CM

## 2012-05-28 DIAGNOSIS — F17200 Nicotine dependence, unspecified, uncomplicated: Secondary | ICD-10-CM | POA: Insufficient documentation

## 2012-05-28 DIAGNOSIS — Z9114 Patient's other noncompliance with medication regimen: Secondary | ICD-10-CM

## 2012-05-28 DIAGNOSIS — Z91040 Latex allergy status: Secondary | ICD-10-CM | POA: Insufficient documentation

## 2012-05-28 DIAGNOSIS — Z880 Allergy status to penicillin: Secondary | ICD-10-CM | POA: Insufficient documentation

## 2012-05-28 LAB — CBC WITH DIFFERENTIAL/PLATELET
Basophils Absolute: 0 10*3/uL (ref 0.0–0.1)
HCT: 33.8 % — ABNORMAL LOW (ref 36.0–46.0)
Lymphocytes Relative: 20 % (ref 12–46)
Lymphs Abs: 1.7 10*3/uL (ref 0.7–4.0)
Monocytes Absolute: 0.5 10*3/uL (ref 0.1–1.0)
Neutro Abs: 6.4 10*3/uL (ref 1.7–7.7)
Platelets: 157 10*3/uL (ref 150–400)
RBC: 3.8 MIL/uL — ABNORMAL LOW (ref 3.87–5.11)
RDW: 13.2 % (ref 11.5–15.5)
WBC: 8.7 10*3/uL (ref 4.0–10.5)

## 2012-05-28 LAB — RAPID URINE DRUG SCREEN, HOSP PERFORMED
Amphetamines: NOT DETECTED
Barbiturates: NOT DETECTED
Opiates: NOT DETECTED
Tetrahydrocannabinol: NOT DETECTED

## 2012-05-28 LAB — URINALYSIS, ROUTINE W REFLEX MICROSCOPIC
Bilirubin Urine: NEGATIVE
Hgb urine dipstick: NEGATIVE
Ketones, ur: NEGATIVE mg/dL
Protein, ur: NEGATIVE mg/dL
Urobilinogen, UA: 1 mg/dL (ref 0.0–1.0)

## 2012-05-28 LAB — COMPREHENSIVE METABOLIC PANEL
ALT: 8 U/L (ref 0–35)
AST: 13 U/L (ref 0–37)
CO2: 26 mEq/L (ref 19–32)
Chloride: 103 mEq/L (ref 96–112)
GFR calc non Af Amer: >90 mL/min (ref >90)
Glucose, Bld: 119 mg/dL — ABNORMAL HIGH (ref 70–99)
Sodium: 136 mEq/L (ref 135–145)
Total Bilirubin: 0.4 mg/dL (ref 0.3–1.2)

## 2012-05-28 NOTE — ED Notes (Signed)
Last felt baby move 5 min ago.

## 2012-05-28 NOTE — ED Notes (Signed)
Pregnant, [redacted] weeks pregnant.  Had 2 molars pulled on Tuesday. Headache and  Swelling of hands and feet.

## 2012-05-28 NOTE — ED Provider Notes (Signed)
History     CSN: [id number]  Arrival date & time 05/28/12  1616   First MD Initiated Contact with Patient 05/28/12 1647      Chief Complaint  Patient presents with  . Dental Pain    (Consider location/radiation/quality/duration/timing/severity/associated sxs/prior treatment) HPI  Patient complaining of pain at site of dental extraction on August 20 by Dr. Barbette Merino. She states that she was given pain medicine which made her sick and then was given another prescription for pain medicine which she has not been taking. She denies any increased swelling or redness. She states she has not been taking by mouth as well due to the pain.  Past Medical History  Diagnosis Date  . Pregnancy     History reviewed. No pertinent past surgical history.  History reviewed. No pertinent family history.  History  Substance Use Topics  . Smoking status: Current Everyday Smoker -- 0.5 packs/day    Types: Cigarettes  . Smokeless tobacco: Not on file  . Alcohol Use: No    OB History    Grav Para Term Preterm Abortions TAB SAB Ect Mult Living   1               Review of Systems  Musculoskeletal:       Foot swelling bilaterally    Allergies  Aspirin; Latex; and Penicillins  Home Medications   Current Outpatient Rx  Name Route Sig Dispense Refill  . CLINDAMYCIN HCL 300 MG PO CAPS Oral Take 300 mg by mouth 4 (four) times daily.    Marland Kitchen HYDROCODONE-ACETAMINOPHEN 5-325 MG PO TABS Oral Take 1 tablet by mouth every 4 (four) hours as needed. For pain    . LANSOPRAZOLE 30 MG PO CPDR Oral Take 30 mg by mouth daily. May take as needed for heartburn    . ONDANSETRON HCL 8 MG PO TABS Oral Take 8 mg by mouth every 8 (eight) hours as needed. For nausea    . OXYCODONE-ACETAMINOPHEN 5-325 MG PO TABS Oral Take 1 tablet by mouth every 6 (six) hours as needed. For pain    . PRENATAL 27-0.8 MG PO TABS Oral Take 1 tablet by mouth daily.      BP 130/82  Pulse 88  Temp 98.2 F (36.8 C) (Oral)  Resp 20  Ht  5\' 6"  (1.676 m)  Wt 200 lb (90.719 kg)  BMI 32.28 kg/m2  SpO2 99%  LMP 09/06/2011  Physical Exam  Nursing note and vitals reviewed. Constitutional: She appears well-developed and well-nourished.  HENT:  Head: Normocephalic and atraumatic.       Area on upper gum and lower gum and molar area with consistent with previous dental extraction with no. He dental swelling erythema or joints.  Eyes: Conjunctivae and EOM are normal. Pupils are equal, round, and reactive to light.  Neck: Normal range of motion. Neck supple.  Cardiovascular: Normal rate, regular rhythm, normal heart sounds and intact distal pulses.   Pulmonary/Chest: Effort normal and breath sounds normal.  Abdominal: Soft. Bowel sounds are normal.  Musculoskeletal: Normal range of motion.       Trace edema bilateral dorsal aspects of the  Neurological: She is alert.  Skin: Skin is warm and dry.  Psychiatric: She has a normal mood and affect. Thought content normal.    ED Course  Procedures (including critical care time)  Labs Reviewed  CBC WITH DIFFERENTIAL - Abnormal; Notable for the following:    RBC 3.80 (*)     Hemoglobin 11.6 (*)  HCT 33.8 (*)     All other components within normal limits  COMPREHENSIVE METABOLIC PANEL - Abnormal; Notable for the following:    Glucose, Bld 119 (*)     Total Protein 5.5 (*)     Albumin 2.4 (*)     Alkaline Phosphatase 120 (*)     All other components within normal limits  URINALYSIS, ROUTINE W REFLEX MICROSCOPIC - Abnormal; Notable for the following:    Color, Urine AMBER (*)  BIOCHEMICALS MAY BE AFFECTED BY COLOR   All other components within normal limits  ACETAMINOPHEN LEVEL  URINE RAPID DRUG SCREEN (HOSP PERFORMED)   No results found.   No diagnosis found.    MDM  I discussed with the patient her pain medicine use specifically the fact that she has been prescribed 195 pills of mostly hydrocodone but some oxycodone since July 27. Patient then stated that she was  here for her leg swelling. She states that she has been unable to be seen at Dr. Rayna Sexton office. I discussed the patient's care with Dr. Emelda Fear. He states he will see her at 9 AM on Monday morning. Due to the large quantity of Tylenol the patient has had with her next narcotic medicines a Tylenol level was obtained which is normal her alkaline phosphatase is elevated but transaminases are normal. Patient is cautioned to avoid any further Tylenol. She is to use cold compresses for her jaws. She is taking clindamycin. Her platelets are normal and there is no evidence of protein in her urine and her hemoglobin is 11.6 consistent with anemia of pregnancy.        Hilario Quarry, MD 05/28/12 1816

## 2012-06-21 ENCOUNTER — Other Ambulatory Visit: Payer: Self-pay | Admitting: Obstetrics and Gynecology

## 2012-06-23 ENCOUNTER — Encounter (HOSPITAL_COMMUNITY): Payer: Self-pay

## 2012-06-23 ENCOUNTER — Encounter (HOSPITAL_COMMUNITY): Payer: Self-pay | Admitting: Pharmacist

## 2012-06-23 NOTE — H&P (Signed)
[name] is a 41 y.o. female gravida 7 para 501 5 now at [redacted] weeks gestation presenting for scheduled repeat cesarean section and tubal ligation. Pregnancy course is been followed at [facility] and has been notable for appropriate fundal height growth and weight gain the patient has signed Medicaid tubal sterilization request.  . History OB History    Grav Para Term Preterm Abortions TAB SAB Ect Mult Living   7 5 5  1  1   5      Past Medical History  Diagnosis Date  . Pregnancy    No past surgical history on file. for prior cesarean section Family History: family history is not on file. Social History:  reports that she has been smoking Cigarettes.  She has been smoking about .5 packs per day. She does not have any smokeless tobacco history on file. She reports that she does not drink alcohol or use illicit drugs.   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 Exam Physical Exam  Constitutional: She is oriented to person, place, and time. She appears well-developed and well-nourished.       Weight 202.4 pounds on 06/21/2012 blood pressure 150/84. Urine protein negative total weight gain pregnancy 19 pounds  HENT:  Head: Normocephalic.  Eyes: Pupils are equal, round, and reactive to light.  Neck: Normal range of motion. Neck supple.  Cardiovascular: Normal rate and regular rhythm.   Respiratory: Effort normal.  GI: Soft.       Gravid uterus 37 cm fundal height  estimated fetal weight 7 pounds  Genitourinary: Vagina normal.  Neurological: She is oriented to person, place, and time.  Psychiatric: She has a normal mood and affect. Her behavior is normal. Judgment and thought content normal.    Prenatal labs: ABO, Rh: O/Negative/-- (02/14 0000) Antibody:   Rubella: Immune (02/14 0000) RPR:   nonreactive  HBsAg: Negative (02/14 0000)  HIV: Non-reactive (02/14 0000)  GBS:   negative GBS  Assessment/Plan: Pregnancy [redacted] weeks gestation prior cesarean section x4 requesting repeat cesarean section and tubal sterilization   Keondre Markson V 06/23/2012, 9:06 PM

## 2012-06-24 ENCOUNTER — Encounter (HOSPITAL_COMMUNITY)
Admission: RE | Admit: 2012-06-24 | Discharge: 2012-06-24 | Disposition: A | Discharge status: Home / Self Care | Payer: Medicare Other | Source: Ambulatory Visit | Attending: Obstetrics and Gynecology | Admitting: Obstetrics and Gynecology

## 2012-06-25 ENCOUNTER — Encounter (HOSPITAL_COMMUNITY): Payer: Self-pay | Admitting: Anesthesiology

## 2012-06-25 ENCOUNTER — Inpatient Hospital Stay (HOSPITAL_COMMUNITY)
Admission: RE | Admit: 2012-06-25 | Discharge: 2012-06-28 | DRG: 766 | Disposition: A | Discharge status: Home / Self Care | Payer: Medicare Other | Source: Ambulatory Visit | Attending: Obstetrics and Gynecology | Admitting: Obstetrics and Gynecology

## 2012-06-25 ENCOUNTER — Inpatient Hospital Stay (HOSPITAL_COMMUNITY): Payer: Medicare Other

## 2012-06-25 ENCOUNTER — Encounter (HOSPITAL_COMMUNITY): Payer: Self-pay

## 2012-06-25 ENCOUNTER — Encounter (HOSPITAL_COMMUNITY): Payer: Self-pay | Admitting: *Deleted

## 2012-06-25 ENCOUNTER — Encounter (HOSPITAL_COMMUNITY)
Admission: RE | Disposition: A | Discharge status: Home / Self Care | Payer: Self-pay | Source: Ambulatory Visit | Attending: Obstetrics and Gynecology

## 2012-06-25 DIAGNOSIS — O09529 Supervision of elderly multigravida, unspecified trimester: Secondary | ICD-10-CM | POA: Diagnosis present

## 2012-06-25 DIAGNOSIS — Z302 Encounter for sterilization: Secondary | ICD-10-CM

## 2012-06-25 DIAGNOSIS — Z01810 Encounter for preprocedural cardiovascular examination: Secondary | ICD-10-CM

## 2012-06-25 DIAGNOSIS — Z01812 Encounter for preprocedural laboratory examination: Secondary | ICD-10-CM

## 2012-06-25 DIAGNOSIS — O34219 Maternal care for unspecified type scar from previous cesarean delivery: Principal | ICD-10-CM | POA: Diagnosis present

## 2012-06-25 DIAGNOSIS — Z01818 Encounter for other preprocedural examination: Secondary | ICD-10-CM

## 2012-06-25 HISTORY — DX: Headache: R51

## 2012-06-25 HISTORY — DX: Unspecified osteoarthritis, unspecified site: M19.90

## 2012-06-25 HISTORY — DX: Fibromyalgia: M79.7

## 2012-06-25 LAB — URINALYSIS, ROUTINE W REFLEX MICROSCOPIC
Glucose, UA: NEGATIVE mg/dL
Ketones, ur: 40 mg/dL — AB
Leukocytes, UA: NEGATIVE
Nitrite: NEGATIVE
Specific Gravity, Urine: >1.03 — ABNORMAL HIGH (ref 1.005–1.030)
pH: 5.5 (ref 5.0–8.0)

## 2012-06-25 LAB — CBC
MCHC: 27.6 g/dL — ABNORMAL LOW (ref 30.0–36.0)
MCV: 105.7 fL — ABNORMAL HIGH (ref 78.0–100.0)
Platelets: 134 10*3/uL — ABNORMAL LOW (ref 150–400)
RDW: 15 % (ref 11.5–15.5)
WBC: 9.1 10*3/uL (ref 4.0–10.5)

## 2012-06-25 LAB — SURGICAL PCR SCREEN: MRSA, PCR: NEGATIVE

## 2012-06-25 LAB — TYPE AND SCREEN
Antibody Screen: POSITIVE
DAT, IgG: NEGATIVE

## 2012-06-25 SURGERY — Surgical Case
Anesthesia: Spinal | Site: Abdomen | Laterality: Bilateral | Wound class: Clean Contaminated

## 2012-06-25 MED ORDER — BUPIVACAINE IN DEXTROSE 0.75-8.25 % IT SOLN
INTRATHECAL | Status: DC | PRN
  Administered 2012-06-25: 1.7 mL via INTRATHECAL

## 2012-06-25 MED ORDER — PHENYLEPHRINE HCL 10 MG/ML IJ SOLN
INTRAMUSCULAR | Status: DC | PRN
  Administered 2012-06-25 (×5): 40 ug via INTRAVENOUS
  Administered 2012-06-25: 50 ug via INTRAVENOUS

## 2012-06-25 MED ORDER — MORPHINE SULFATE (PF) 0.5 MG/ML IJ SOLN
INTRAMUSCULAR | Status: DC | PRN
  Administered 2012-06-25: .15 mg via INTRATHECAL

## 2012-06-25 MED ORDER — DIBUCAINE 1 % RE OINT: 1.0000 "application " | TOPICAL_OINTMENT | RECTAL | Status: DC | PRN

## 2012-06-25 MED ORDER — SCOPOLAMINE 1 MG/3DAYS TD PT72
MEDICATED_PATCH | TRANSDERMAL | Status: AC
  Administered 2012-06-25: 1.5 mg
  Filled 2012-06-25: qty 1

## 2012-06-25 MED ORDER — SODIUM CHLORIDE 0.9 % IJ SOLN: 3.0000 mL | INTRAMUSCULAR | Status: DC | PRN

## 2012-06-25 MED ORDER — MENTHOL 3 MG MT LOZG: 1.0000 | LOZENGE | OROMUCOSAL | Status: DC | PRN

## 2012-06-25 MED ORDER — MEPERIDINE HCL 25 MG/ML IJ SOLN
INTRAMUSCULAR | Status: AC
  Administered 2012-06-25: 6.25 mg via INTRAVENOUS
  Filled 2012-06-25: qty 1

## 2012-06-25 MED ORDER — TETANUS-DIPHTH-ACELL PERTUSSIS 5-2.5-18.5 LF-MCG/0.5 IM SUSP
0.5000 mL | Freq: Once | INTRAMUSCULAR | Status: AC
  Administered 2012-06-26: 0.5 mL via INTRAMUSCULAR

## 2012-06-25 MED ORDER — FENTANYL CITRATE 0.05 MG/ML IJ SOLN
INTRAMUSCULAR | Status: DC | PRN
  Administered 2012-06-25: 25 ug via INTRATHECAL

## 2012-06-25 MED ORDER — LACTATED RINGERS IV SOLN
INTRAVENOUS | Status: DC
  Administered 2012-06-25: 125 mL/h via INTRAVENOUS

## 2012-06-25 MED ORDER — LACTATED RINGERS IV SOLN: INTRAVENOUS | Status: DC

## 2012-06-25 MED ORDER — ONDANSETRON HCL 4 MG/2ML IJ SOLN
INTRAMUSCULAR | Status: DC | PRN
  Administered 2012-06-25: 4 mg via INTRAVENOUS

## 2012-06-25 MED ORDER — OXYCODONE-ACETAMINOPHEN 5-325 MG PO TABS
1.0000 | ORAL_TABLET | ORAL | Status: DC | PRN
  Administered 2012-06-26 – 2012-06-28 (×11): 2 via ORAL
  Filled 2012-06-25 (×11): qty 2

## 2012-06-25 MED ORDER — DIPHENHYDRAMINE HCL 50 MG/ML IJ SOLN: 12.5000 mg | INTRAMUSCULAR | Status: DC | PRN

## 2012-06-25 MED ORDER — HYDROMORPHONE HCL PF 1 MG/ML IJ SOLN
0.2500 mg | INTRAMUSCULAR | Status: DC | PRN
  Administered 2012-06-25 (×2): 0.5 mg via INTRAVENOUS

## 2012-06-25 MED ORDER — SIMETHICONE 80 MG PO CHEW
80.0000 mg | CHEWABLE_TABLET | ORAL | Status: DC | PRN
  Administered 2012-06-26: 80 mg via ORAL

## 2012-06-25 MED ORDER — LACTATED RINGERS IV SOLN
INTRAVENOUS | Status: DC | PRN
  Administered 2012-06-25 (×3): via INTRAVENOUS

## 2012-06-25 MED ORDER — DIPHENHYDRAMINE HCL 25 MG PO CAPS: 25.0000 mg | ORAL_CAPSULE | ORAL | Status: DC | PRN

## 2012-06-25 MED ORDER — ONDANSETRON HCL 4 MG/2ML IJ SOLN
INTRAMUSCULAR | Status: AC
  Filled 2012-06-25: qty 2

## 2012-06-25 MED ORDER — DIPHENHYDRAMINE HCL 25 MG PO CAPS: 25.0000 mg | ORAL_CAPSULE | Freq: Four times a day (QID) | ORAL | Status: DC | PRN

## 2012-06-25 MED ORDER — WITCH HAZEL-GLYCERIN EX PADS: 1.0000 "application " | MEDICATED_PAD | CUTANEOUS | Status: DC | PRN

## 2012-06-25 MED ORDER — DIPHENHYDRAMINE HCL 50 MG/ML IJ SOLN: 25.0000 mg | INTRAMUSCULAR | Status: DC | PRN

## 2012-06-25 MED ORDER — NALBUPHINE HCL 10 MG/ML IJ SOLN: 5.0000 mg | INTRAMUSCULAR | Status: DC | PRN

## 2012-06-25 MED ORDER — NALOXONE HCL 0.4 MG/ML IJ SOLN: 0.4000 mg | INTRAMUSCULAR | Status: DC | PRN

## 2012-06-25 MED ORDER — CEFAZOLIN SODIUM-DEXTROSE 2-3 GM-% IV SOLR: 2.0000 g | INTRAVENOUS | Status: DC

## 2012-06-25 MED ORDER — MUPIROCIN 2 % EX OINT
TOPICAL_OINTMENT | CUTANEOUS | Status: AC
  Administered 2012-06-25: 1 via NASAL
  Filled 2012-06-25: qty 22

## 2012-06-25 MED ORDER — LACTATED RINGERS IV SOLN
40.0000 [IU] | INTRAVENOUS | Status: DC | PRN
  Administered 2012-06-25: 40 [IU] via INTRAVENOUS

## 2012-06-25 MED ORDER — OXYTOCIN 40 UNITS IN LACTATED RINGERS INFUSION - SIMPLE MED: 62.5000 mL/h | INTRAVENOUS | Status: AC

## 2012-06-25 MED ORDER — CEFAZOLIN SODIUM-DEXTROSE 2-3 GM-% IV SOLR
INTRAVENOUS | Status: AC
  Administered 2012-06-25: 2 g via INTRAVENOUS
  Filled 2012-06-25: qty 50

## 2012-06-25 MED ORDER — IBUPROFEN 600 MG PO TABS: 600.0000 mg | ORAL_TABLET | Freq: Four times a day (QID) | ORAL | Status: DC

## 2012-06-25 MED ORDER — SODIUM CHLORIDE 0.9 % IV SOLN
1.0000 ug/kg/h | INTRAVENOUS | Status: DC | PRN
  Filled 2012-06-25: qty 2.5

## 2012-06-25 MED ORDER — LANOLIN HYDROUS EX OINT: 1.0000 "application " | TOPICAL_OINTMENT | CUTANEOUS | Status: DC | PRN

## 2012-06-25 MED ORDER — FENTANYL CITRATE 0.05 MG/ML IJ SOLN
INTRAMUSCULAR | Status: AC
  Filled 2012-06-25: qty 2

## 2012-06-25 MED ORDER — ZOLPIDEM TARTRATE 5 MG PO TABS: 5.0000 mg | ORAL_TABLET | Freq: Every evening | ORAL | Status: DC | PRN

## 2012-06-25 MED ORDER — PRENATAL MULTIVITAMIN CH
1.0000 | ORAL_TABLET | Freq: Every day | ORAL | Status: DC
  Administered 2012-06-27 – 2012-06-28 (×2): 1 via ORAL
  Filled 2012-06-25 (×2): qty 1

## 2012-06-25 MED ORDER — OXYTOCIN 10 UNIT/ML IJ SOLN
INTRAMUSCULAR | Status: AC
  Filled 2012-06-25: qty 4

## 2012-06-25 MED ORDER — PHENYLEPHRINE 40 MCG/ML (10ML) SYRINGE FOR IV PUSH (FOR BLOOD PRESSURE SUPPORT)
PREFILLED_SYRINGE | INTRAVENOUS | Status: AC
  Filled 2012-06-25: qty 10

## 2012-06-25 MED ORDER — METOCLOPRAMIDE HCL 5 MG/ML IJ SOLN: 10.0000 mg | Freq: Three times a day (TID) | INTRAMUSCULAR | Status: DC | PRN

## 2012-06-25 MED ORDER — HYDROMORPHONE HCL PF 1 MG/ML IJ SOLN
INTRAMUSCULAR | Status: AC
  Administered 2012-06-25: 0.5 mg via INTRAVENOUS
  Filled 2012-06-25: qty 1

## 2012-06-25 MED ORDER — SENNOSIDES-DOCUSATE SODIUM 8.6-50 MG PO TABS
2.0000 | ORAL_TABLET | Freq: Every day | ORAL | Status: DC
  Administered 2012-06-27: 2 via ORAL

## 2012-06-25 MED ORDER — MORPHINE SULFATE 0.5 MG/ML IJ SOLN
INTRAMUSCULAR | Status: AC
  Filled 2012-06-25: qty 10

## 2012-06-25 MED ORDER — ONDANSETRON HCL 4 MG/2ML IJ SOLN: 4.0000 mg | Freq: Three times a day (TID) | INTRAMUSCULAR | Status: DC | PRN

## 2012-06-25 MED ORDER — SIMETHICONE 80 MG PO CHEW
80.0000 mg | CHEWABLE_TABLET | Freq: Three times a day (TID) | ORAL | Status: DC
  Administered 2012-06-26 – 2012-06-28 (×7): 80 mg via ORAL

## 2012-06-25 MED ORDER — MEPERIDINE HCL 25 MG/ML IJ SOLN
6.2500 mg | INTRAMUSCULAR | Status: DC | PRN
  Administered 2012-06-25: 6.25 mg via INTRAVENOUS

## 2012-06-25 SURGICAL SUPPLY — 32 items
APL SKNCLS STERI-STRIP NONHPOA (GAUZE/BANDAGES/DRESSINGS)
BENZOIN TINCTURE PRP APPL 2/3 (GAUZE/BANDAGES/DRESSINGS) IMPLANT
CLOTH BEACON ORANGE TIMEOUT ST (SAFETY) ×2 IMPLANT
DRSG COVADERM 4X10 (GAUZE/BANDAGES/DRESSINGS) ×1 IMPLANT
ELECT REM PT RETURN 9FT ADLT (ELECTROSURGICAL) ×2
ELECTRODE REM PT RTRN 9FT ADLT (ELECTROSURGICAL) ×1 IMPLANT
EXTRACTOR VACUUM KIWI (MISCELLANEOUS) IMPLANT
GLOVE BIO SURGEON ST LM GN SZ9 (GLOVE) ×2 IMPLANT
GLOVE BIOGEL PI IND STRL 9 (GLOVE) ×2 IMPLANT
GLOVE BIOGEL PI INDICATOR 9 (GLOVE) ×2
GOWN PREVENTION PLUS LG XLONG (DISPOSABLE) ×2 IMPLANT
GOWN PREVENTION PLUS XXLARGE (GOWN DISPOSABLE) ×2 IMPLANT
GOWN STRL REIN 3XL LVL4 (GOWN DISPOSABLE) ×2 IMPLANT
NDL HYPO 25X5/8 SAFETYGLIDE (NEEDLE) IMPLANT
NEEDLE HYPO 25X5/8 SAFETYGLIDE (NEEDLE) IMPLANT
NS IRRIG 1000ML POUR BTL (IV SOLUTION) ×2 IMPLANT
PACK C SECTION WH (CUSTOM PROCEDURE TRAY) ×2 IMPLANT
PAD OB MATERNITY 4.3X12.25 (PERSONAL CARE ITEMS) IMPLANT
RETRACTOR WND ALEXIS 25 LRG (MISCELLANEOUS) IMPLANT
RTRCTR C-SECT PINK 25CM LRG (MISCELLANEOUS) IMPLANT
RTRCTR WOUND ALEXIS 25CM LRG (MISCELLANEOUS) ×2
SLEEVE SCD COMPRESS KNEE MED (MISCELLANEOUS) IMPLANT
STRIP CLOSURE SKIN 1/2X4 (GAUZE/BANDAGES/DRESSINGS) IMPLANT
SUT CHROMIC 0 CTX 36 (SUTURE) ×4 IMPLANT
SUT VIC AB 0 CT1 27 (SUTURE) ×2
SUT VIC AB 0 CT1 27XBRD ANBCTR (SUTURE) ×1 IMPLANT
SUT VIC AB 2-0 CT1 27 (SUTURE) ×4
SUT VIC AB 2-0 CT1 TAPERPNT 27 (SUTURE) ×2 IMPLANT
SUT VIC AB 4-0 KS 27 (SUTURE) ×2 IMPLANT
SYR BULB IRRIGATION 50ML (SYRINGE) IMPLANT
TRAY FOLEY CATH 14FR (SET/KITS/TRAYS/PACK) ×2 IMPLANT
WATER STERILE IRR 1000ML POUR (IV SOLUTION) ×1 IMPLANT

## 2012-06-25 NOTE — Anesthesia Postprocedure Evaluation (Signed)
Anesthesia Post Note  Patient: [name]  Procedure(s) Performed: Procedure(s) (LRB): CESAREAN SECTION WITH BILATERAL TUBAL LIGATION (Bilateral)  Anesthesia type: Spinal  Patient location: PACU  Post pain: Pain level controlled  Post assessment: Post-op Vital signs reviewed  Last Vitals:  Filed Vitals:   06/25/12 1945  BP: 137/87  Pulse: 74  Temp: 36.6 C  Resp: 18    Post vital signs: Reviewed  Level of consciousness: awake  Complications: No apparent anesthesia complications

## 2012-06-25 NOTE — Anesthesia Preprocedure Evaluation (Addendum)
Anesthesia Evaluation  Patient identified by MRN, date of birth, ID band Patient awake    Reviewed: Allergy & Precautions, H&P , Patient's Chart, lab work & pertinent test results  Airway Mallampati: II TM Distance: >3 FB Neck ROM: Full    Dental No notable dental hx. (+) Teeth Intact   Pulmonary Current Smoker,  breath sounds clear to auscultation  Pulmonary exam normal       Cardiovascular negative cardio ROS  Rhythm:Regular Rate:Normal     Neuro/Psych  Headaches,  Neuromuscular disease negative psych ROS   GI/Hepatic negative GI ROS, Neg liver ROS,   Endo/Other  negative endocrine ROS  Renal/GU negative Renal ROS  negative genitourinary   Musculoskeletal  (+) Fibromyalgia -  Abdominal Normal abdominal exam  (+)   Peds  Hematology negative hematology ROS (+)   Anesthesia Other Findings   Reproductive/Obstetrics (+) Pregnancy Previous C/Section x 5                           Anesthesia Physical Anesthesia Plan  ASA: II  Anesthesia Plan: Spinal   Post-op Pain Management:    Induction:   Airway Management Planned: Natural Airway  Additional Equipment:   Intra-op Plan:   Post-operative Plan:   Informed Consent: I have reviewed the patients History and Physical, chart, labs and discussed the procedure including the risks, benefits and alternatives for the proposed anesthesia with the patient or authorized representative who has indicated his/her understanding and acceptance.     Plan Discussed with: Anesthesiologist, CRNA and Surgeon  Anesthesia Plan Comments:        Anesthesia Quick Evaluation

## 2012-06-25 NOTE — Transfer of Care (Signed)
Immediate Anesthesia Transfer of Care Note  Patient: [name]  Procedure(s) Performed: Procedure(s) (LRB) with comments: CESAREAN SECTION WITH BILATERAL TUBAL LIGATION (Bilateral)  Patient Location: PACU  Anesthesia Type: Spinal  Level of Consciousness: awake, alert  and oriented  Airway & Oxygen Therapy: Patient Spontanous Breathing  Post-op Assessment: Report given to PACU RN and Post -op Vital signs reviewed and stable  Post vital signs: stable  Complications: No apparent anesthesia complications

## 2012-06-25 NOTE — Brief Op Note (Addendum)
06/25/2012  6:31 PM  PATIENT:  [name]  41 y.o. female  PRE-OPERATIVE DIAGNOSIS:  prev c/s x4;desire sterilization  POST-OPERATIVE DIAGNOSIS:  prev c/s x4 ;desire sterilization  PROCEDURE:  Procedure(s) (LRB) with comments: CESAREAN SECTION WITH BILATERAL TUBAL LIGATION (Bilateral)  SURGEON:  Surgeon(s) and Role:    * Tilda Burrow, MD - Primary  PHYSICIAN ASSISTANT: Marina Goodell MD  ASSISTANTS: none   ANESTHESIA:   spinal  EBL:  Total I/O In: 3600 [I.V.:3600] Out: 700 [Urine:100; Blood:600]  BLOOD ADMINISTERED:none DRAINS: Urinary Catheter (Foley)  LOCAL MEDICATIONS USED:  NONE  SPECIMEN:   placenta to labor and delivery DISPOSITION OF SPECIMEN:  Labor and delivery COUNTS:  YES  TOURNIQUET:  * No tourniquets in log * DICTATION: .Dragon Dictation Patient was taken to the operating room prepped and draped for lower abdominal surgery, timeout conducted and Ancef 2 g IV administered. Transverse lower incision was made the method of Pfannenstiel was sharply dissection through the very fibrotic fascial layer. Uterine cavity was entered. Minimal adhesions were encountered inside the abdominal cavity. Lap was not developed but there were some thin adhesions that required transection. Alexis wound retractor was in place and a transverse uterine incision made and the baby delivered by fundal pressure and guidance the vertex. A clamp and then female infant Apgars 9 and 9 were cared for by [name]. Placenta delivered by could then massaged and then the uterus was irrigated. 2 layer closure was performed beginning at each corner and sewing to the midline. A second layer closure with continuous running 0 chromic..  Tubal ligation: Each fallopian tube was identified to its fimbriated end and a Filshie clip placed over the midportion of each tube.  Peritoneum was closed with running 2-0 in the fascia closed with running 0 Vicryl, and subcutaneous tissues reapproximated with 2-0 Vicryl  and subcuticular 4-0 Vicryl on a Keith needle used to for skin closure Steri-Strips applied EBL 700 cc sponge and needle counts correct PLAN OF CARE: Admit to inpatient   PATIENT DISPOSITION:  PACU - hemodynamically stable.   Delay start of Pharmacological VTE agent (>24hrs) due to surgical blood loss or risk of bleeding: not applicable

## 2012-06-25 NOTE — Op Note (Signed)
See operative note details included in brief operative note 

## 2012-06-26 LAB — CBC
Hemoglobin: 10.8 g/dL — ABNORMAL LOW (ref 12.0–15.0)
MCH: 29.3 pg (ref 26.0–34.0)
MCHC: 33.2 g/dL (ref 30.0–36.0)
Platelets: 113 10*3/uL — ABNORMAL LOW (ref 150–400)
RDW: 13.4 % (ref 11.5–15.5)

## 2012-06-26 MED ORDER — INFLUENZA VIRUS VACC SPLIT PF IM SUSP
0.5000 mL | Freq: Once | INTRAMUSCULAR | Status: AC
  Administered 2012-06-27: 0.5 mL via INTRAMUSCULAR
  Filled 2012-06-26: qty 0.5

## 2012-06-26 MED ORDER — ONDANSETRON HCL 4 MG/2ML IJ SOLN
4.0000 mg | INTRAMUSCULAR | Status: DC | PRN
  Administered 2012-06-26 (×3): 4 mg via INTRAVENOUS
  Filled 2012-06-26 (×3): qty 2

## 2012-06-26 MED ORDER — ONDANSETRON HCL 4 MG PO TABS: 4.0000 mg | ORAL_TABLET | ORAL | Status: DC | PRN

## 2012-06-26 MED ORDER — HYDROMORPHONE HCL PF 1 MG/ML IJ SOLN
1.0000 mg | INTRAMUSCULAR | Status: DC | PRN
  Administered 2012-06-26 – 2012-06-27 (×3): 1 mg via INTRAVENOUS
  Filled 2012-06-26 (×3): qty 1

## 2012-06-26 MED ORDER — HYDROMORPHONE HCL PF 1 MG/ML IJ SOLN: 2.0000 mg | INTRAMUSCULAR | Status: DC | PRN

## 2012-06-26 NOTE — Anesthesia Postprocedure Evaluation (Signed)
  Anesthesia Post-op Note  Patient: [name]  Procedure(s) Performed: Procedure(s) (LRB) with comments: CESAREAN SECTION WITH BILATERAL TUBAL LIGATION (Bilateral)  Patient Location: PACU and Mother/Baby  Anesthesia Type: Epidural  Level of Consciousness: awake, alert , oriented and patient cooperative  Airway and Oxygen Therapy: Patient Spontanous Breathing  Post-op Pain: none  Post-op Assessment: Post-op Vital signs reviewed and Patient's Cardiovascular Status Stable  Post-op Vital Signs: Reviewed and stable  Complications: No apparent anesthesia complications

## 2012-06-26 NOTE — Addendum Note (Signed)
Addendum  created 06/26/12 1334 by Orlie Pollen, CRNA   Modules edited:Notes Section

## 2012-06-26 NOTE — Progress Notes (Signed)
Subjective: Postpartum Day #1: Cesarean Delivery Patient reports incisional pain and tolerating PO.  Breastfeeding going well. Feels that incision is burning.  Objective: Vital signs in last 24 hours: Temp:  [97.8 F (36.6 C)-99 F (37.2 C)] 98.6 F (37 C) (09/21 0700) Pulse Rate:  [69-84] 80  (09/21 0700) Resp:  [16-21] 18  (09/21 0700) BP: (110-147)/(60-93) 118/74 mmHg (09/21 0700) SpO2:  [96 %-100 %] 98 % (09/21 0200)  Physical Exam:  General: alert, cooperative and mild distress Lochia: appropriate Uterine Fundus: firm Incision: dsg present; clean, dry, intact DVT Evaluation: No evidence of DVT seen on physical exam.   Basename 06/26/12 0600 06/25/12 1555  HGB 10.8* 12.2  HCT 32.5* 44.2    Assessment/Plan: Status post Cesarean section. Doing well postoperatively.  Continue current care. Rev'd nl symptoms for postop status.  Cam Hai 06/26/2012, 8:06 AM

## 2012-06-27 MED ORDER — HYDROCHLOROTHIAZIDE 25 MG PO TABS
25.0000 mg | ORAL_TABLET | Freq: Every day | ORAL | Status: DC
  Administered 2012-06-27 – 2012-06-28 (×2): 25 mg via ORAL
  Filled 2012-06-27 (×3): qty 1

## 2012-06-27 MED ORDER — PNEUMOCOCCAL VAC POLYVALENT 25 MCG/0.5ML IJ INJ
0.5000 mL | INJECTION | INTRAMUSCULAR | Status: AC
  Administered 2012-06-28: 0.5 mL via INTRAMUSCULAR
  Filled 2012-06-27: qty 0.5

## 2012-06-27 NOTE — Progress Notes (Signed)
Subjective: Postpartum Day 2: Cesarean Delivery and BTL Patient reports incisional pain and no problems voiding.  As is usually the case she has multiple complaints. The incision is sore and she didn't bring her abd binder from home, her hands are swollen and feel slightly numb bilateral.  Objective: Vital signs in last 24 hours: Temp:  [97.5 F (36.4 C)-98.2 F (36.8 C)] 98.2 F (36.8 C) (09/22 0535) Pulse Rate:  [66-80] 80  (09/22 0535) Resp:  [18-22] 22  (09/22 0535) BP: (118-137)/(70-82) 134/81 mmHg (09/22 0535) SpO2:  [96 %-98 %] 96 % (09/21 1750)  Physical Exam:  General: alert, cooperative and mild distress Lochia: appropriate Uterine Fundus: firm Incision: healing well, no significant drainage, no dehiscence, no significant erythema, dressing removed and it looks fine DVT Evaluation: No evidence of DVT seen on physical exam.   Basename 06/26/12 0600 06/25/12 1555  HGB 10.8* 12.2  HCT 32.5* 44.2    Assessment/Plan: Status post Cesarean section. Doing well postoperatively.  She will get someone to bring abd binder from home. We will give HCTz daily til d/c in am to aid diuresis /swelling.Pt aware this may reduce milk volume. Pt will be discontinued of the dilaudid she was requesting for postop pain.  Will use motrin and Percocet/.  Will place abd pad over incision after shower to reduce contact from abd binder to incision. D/c home in a.m Monday.  Larenz Frasier V 06/27/2012, 7:39 AM

## 2012-06-27 NOTE — Clinical Social Work Note (Signed)
Clinical Social Work Department PSYCHOSOCIAL ASSESSMENT - MATERNAL/CHILD 06/27/2012  Patient:  [name], [name]  Account Number:  [id number]  Admit Date:  06/25/2012  Marjo Bicker Name:   [name]    Clinical Social Worker:  Truman Hayward, LCSW   Date/Time:  06/27/2012 11:00 AM  Date Referred:  06/27/2012   Referral source  Physician  RN     Referred reason  Psychosocial assessment   Other referral source:    I:  FAMILY / HOME ENVIRONMENT Child's legal guardian:  PARENT  Guardian - Name Guardian - Age Guardian - Address  [name] 41 [address] [place], [place] [address]   Other household support members/support persons Name Relationship DOB  41 year old 15   71 year old BROTHER   61 year old BROTHER   35 year old DAUGHTER    Other support:   oldest dtr, 64, however she has recently stopped being as supportive of her M, starting her own life with her bf. MOB reports only friends and spouse (legally sepreated)    II  PSYCHOSOCIAL DATA Information Source:  Patient Interview  Event organiser Employment:   Financial resources:  [employer] If Medicaid - County:  [employer] Other  [facility]  Food Stamps   School / Grade:   Maternity Care Coordinator / Child Services Coordination / Early Interventions:  Cultural issues impacting care:    III  STRENGTHS Strengths  Adequate Resources  Compliance with medical plan  Home prepared for Child (including basic supplies)   Strength comment:    IV  RISK FACTORS AND CURRENT PROBLEMS Current Problem:  None   Risk Factor & Current Problem Patient Issue Family Issue Risk Factor / Current Problem Comment   N N     V  SOCIAL WORK ASSESSMENT CSW spoke with MOB in room.  MOB reported being in lots of pain which hindered CSW from being able to carry on continuous conversation.  After speaking with RN, CSW just wanted to check with MOB and make sure if there was any support we can help her with.  MOB already has financial resources in  place with medicaid and medicare.  CSW will provide a bundle pack for pt to help with initial supplies with returning home.  CSW attempted to discuss family support, however MOB reported being in too much pain to continue to speak with CSW.  CSW will return with bundle pack and will continue to discuss support with MOB.      VI SOCIAL WORK PLAN  Type of pt/family education:   If child protective services report - county:   If child protective services report - date:   Information/referral to community resources comment:   Other social work plan:

## 2012-06-28 MED ORDER — OXYCODONE-ACETAMINOPHEN 5-325 MG PO TABS: 1.0000 | ORAL_TABLET | ORAL | Status: DC | PRN

## 2012-06-28 NOTE — Progress Notes (Signed)
Ur chart review completed.  

## 2012-06-28 NOTE — Discharge Summary (Signed)
Obstetric Discharge Summary Reason for Admission: cesarean section Student-MidW Student-Midwife Signed Obstetrics L&D Delivery 06/26/2012 8:52 AM     [name] is a 41 y.o. female gravida 7 para 501 5 now at [redacted] weeks gestation presenting for scheduled repeat cesarean section and tubal ligation. Pregnancy course is been followed at [facility] and has been notable for appropriate fundal height growth and weight gain the patient has signed Medicaid tubal sterilization request.    Prenatal Procedures: ultrasound Intrapartum Procedures: cesarean: low cervical, transverse and tubal ligation  Postpartum Procedures: none Complications-Operative and Postpartum: NO COMPLICATIONS.  patient with complaints of hand numbness from edema, as well as c/o incision discomforts. Hemoglobin  Date Value Range Status  06/26/2012 10.8* 12.0 - 15.0 g/dL Final     HCT  Date Value Range Status  06/26/2012 32.5* 36.0 - 46.0 % Final    Physical Exam:  General: alert, no distress and slowed mentation Lochia: appropriate Uterine Fundus: firm Incision: healing well, no significant drainage, no significant erythema DVT Evaluation: No evidence of DVT seen on physical exam.  Discharge Diagnoses: Term Pregnancy-delivered and prior cesarean x 4, desire for sterilization  Discharge Information: Date: 06/28/2012 Activity: unrestricted and pelvic rest x 6 wk Diet: routine Medications: PNV, Ibuprofen and Percocet Condition: stable Instructions: refer to practice specific booklet and follow up 1 week [facility] for incision check Discharge to: home   Newborn Data: Live born female  Birth Weight: 8 lb 1.8 oz (3680 g) APGAR: 8, 9  Home with mother.  Gilberta Peeters V 06/28/2012, 9:16 AM

## 2013-01-20 ENCOUNTER — Emergency Department (HOSPITAL_COMMUNITY)
Admission: EM | Admit: 2013-01-20 | Discharge: 2013-01-20 | Disposition: A | Discharge status: Home / Self Care | Payer: Medicare Other | Attending: Emergency Medicine | Admitting: Emergency Medicine

## 2013-01-20 ENCOUNTER — Encounter (HOSPITAL_COMMUNITY): Payer: Self-pay

## 2013-01-20 DIAGNOSIS — K089 Disorder of teeth and supporting structures, unspecified: Secondary | ICD-10-CM | POA: Insufficient documentation

## 2013-01-20 DIAGNOSIS — Z79899 Other long term (current) drug therapy: Secondary | ICD-10-CM | POA: Insufficient documentation

## 2013-01-20 DIAGNOSIS — K0889 Other specified disorders of teeth and supporting structures: Secondary | ICD-10-CM

## 2013-01-20 DIAGNOSIS — K029 Dental caries, unspecified: Secondary | ICD-10-CM | POA: Insufficient documentation

## 2013-01-20 DIAGNOSIS — F17200 Nicotine dependence, unspecified, uncomplicated: Secondary | ICD-10-CM | POA: Insufficient documentation

## 2013-01-20 DIAGNOSIS — Z792 Long term (current) use of antibiotics: Secondary | ICD-10-CM | POA: Insufficient documentation

## 2013-01-20 DIAGNOSIS — Z8739 Personal history of other diseases of the musculoskeletal system and connective tissue: Secondary | ICD-10-CM | POA: Insufficient documentation

## 2013-01-20 MED ORDER — OXYCODONE-ACETAMINOPHEN 5-325 MG PO TABS
1.0000 | ORAL_TABLET | ORAL | Status: DC | PRN
Start: 2013-01-20 — End: 2013-05-10

## 2013-01-20 MED ORDER — CLINDAMYCIN HCL 300 MG PO CAPS: 300.0000 mg | ORAL_CAPSULE | Freq: Four times a day (QID) | ORAL | Status: DC

## 2013-01-20 NOTE — ED Notes (Signed)
Pt c/o chronic toothache.  Reports is scheduled to have tooth pulled next week.  Reports is on clindamycin.

## 2013-01-20 NOTE — ED Provider Notes (Signed)
History     CSN: [id number]  Arrival date & time 01/20/13  1640   First MD Initiated Contact with Patient 01/20/13 1705      Chief Complaint  Patient presents with  . Dental Pain    (Consider location/radiation/quality/duration/timing/severity/associated sxs/prior treatment) HPI Comments: Patient with recurrent dental pain c/o worsening pain for 3 days.  States she has an appt with a dentist next week.    Patient is a 42 y.o. female presenting with tooth pain. The history is provided by the patient.  Dental PainThe primary symptoms include mouth pain. Primary symptoms do not include headaches, fever, shortness of breath, sore throat, angioedema or cough. The symptoms began 3 to 5 days ago. The symptoms are worsening. The symptoms are chronic. The symptoms occur constantly.  Mouth pain began 3 - 5 days ago. Affected locations include: gum(s) and teeth.  Additional symptoms include: dental sensitivity to temperature and gum tenderness. Additional symptoms do not include: gum swelling, purulent gums, trismus, jaw pain, facial swelling, trouble swallowing, drooling, ear pain and swollen glands. Medical issues include: smoking and periodontal disease.    Past Medical History  Diagnosis Date  . Pregnancy   . SVD (spontaneous vaginal delivery)     x 1  . Headache   . Fibromyalgia   . Arthritis     hands, knees    Past Surgical History  Procedure Laterality Date  . Cesarean section      x 4  . Wisdom tooth extraction    . Right thumb surgery      pins    No family history on file.  History  Substance Use Topics  . Smoking status: Current Some Day Smoker -- 0.50 packs/day for 10 years    Types: Cigarettes  . Smokeless tobacco: Never Used  . Alcohol Use: No    OB History   Grav Para Term Preterm Abortions TAB SAB Ect Mult Living   7 6 6  1  1   6       Review of Systems  Constitutional: Negative for fever and appetite change.  HENT: Positive for dental problem.  Negative for ear pain, congestion, sore throat, facial swelling, drooling, trouble swallowing, neck pain and neck stiffness.   Eyes: Negative for pain and visual disturbance.  Respiratory: Negative for cough and shortness of breath.   Neurological: Negative for dizziness, facial asymmetry and headaches.  Hematological: Negative for adenopathy.  All other systems reviewed and are negative.    Allergies  Aspirin; Latex; and Penicillins  Home Medications   Current Outpatient Rx  Name  Route  Sig  Dispense  Refill  . clindamycin (CLEOCIN) 150 MG capsule   Oral   Take 150 mg by mouth 3 (three) times daily.         Marland Kitchen HYDROcodone-acetaminophen (NORCO) 10-325 MG per tablet   Oral   Take 1 tablet by mouth every 6 (six) hours as needed for pain.         Marland Kitchen lansoprazole (PREVACID) 30 MG capsule   Oral   Take 30 mg by mouth daily. May take as needed for heartburn         . Prenatal Vit-Fe Fumarate-FA (MULTIVITAMIN-PRENATAL) 27-0.8 MG TABS   Oral   Take 1 tablet by mouth daily.           BP 151/82  Pulse 67  Temp(Src) 98.4 F (36.9 C) (Oral)  Resp 20  Ht 5\' 6"  (1.676 m)  Wt 170 lb (77.111 kg)  BMI 27.45 kg/m2  SpO2 100%  LMP 12/22/2012  Physical Exam  Nursing note and vitals reviewed. Constitutional: She is oriented to person, place, and time. She appears well-developed and well-nourished. No distress.  HENT:  Head: Normocephalic and atraumatic. No trismus in the jaw.  Right Ear: Tympanic membrane and ear canal normal.  Left Ear: Tympanic membrane and ear canal normal.  Mouth/Throat: Uvula is midline, oropharynx is clear and moist and mucous membranes are normal. Dental caries present. No dental abscesses or edematous.  Multiple dental caries and discoloration of the teeth.  No facial edema, trismus, or obvious dental abscess  Neck: Normal range of motion. Neck supple.  Cardiovascular: Normal rate, regular rhythm, normal heart sounds and intact distal pulses.   No  murmur heard. Pulmonary/Chest: Effort normal and breath sounds normal. No respiratory distress.  Musculoskeletal: Normal range of motion.  Lymphadenopathy:    She has no cervical adenopathy.  Neurological: She is alert and oriented to person, place, and time. She exhibits normal muscle tone. Coordination normal.  Skin: Skin is warm and dry.    ED Course  Procedures (including critical care time)  Labs Reviewed - No data to display No results found.      MDM    Patient reviewed on the [place] narcotics database, had prescription for hydrocodone #15 on 01/10/13.   Patient advised that she will need to f/u with a dentist for further management.  She agrees to care plan and verbalized understanding.  She appears stable for discharge       Zowie Lundahl L. Trisha Mangle, PA-C 01/24/13 1553

## 2013-01-25 NOTE — ED Provider Notes (Signed)
Medical screening examination/treatment/procedure(s) were performed by non-physician practitioner and as supervising physician I was immediately available for consultation/collaboration.  Donnetta Hutching, MD 01/25/13 [id number]

## 2013-02-26 ENCOUNTER — Other Ambulatory Visit: Payer: Self-pay | Admitting: Adult Health

## 2013-05-10 ENCOUNTER — Emergency Department (HOSPITAL_COMMUNITY)
Admission: EM | Admit: 2013-05-10 | Discharge: 2013-05-10 | Disposition: A | Discharge status: Home / Self Care | Payer: Medicare Other | Attending: Emergency Medicine | Admitting: Emergency Medicine

## 2013-05-10 ENCOUNTER — Encounter (HOSPITAL_COMMUNITY): Payer: Self-pay | Admitting: *Deleted

## 2013-05-10 DIAGNOSIS — Z91040 Latex allergy status: Secondary | ICD-10-CM | POA: Insufficient documentation

## 2013-05-10 DIAGNOSIS — L0201 Cutaneous abscess of face: Secondary | ICD-10-CM | POA: Insufficient documentation

## 2013-05-10 DIAGNOSIS — Z880 Allergy status to penicillin: Secondary | ICD-10-CM | POA: Insufficient documentation

## 2013-05-10 DIAGNOSIS — L03211 Cellulitis of face: Secondary | ICD-10-CM | POA: Insufficient documentation

## 2013-05-10 DIAGNOSIS — L0291 Cutaneous abscess, unspecified: Secondary | ICD-10-CM

## 2013-05-10 DIAGNOSIS — Z8739 Personal history of other diseases of the musculoskeletal system and connective tissue: Secondary | ICD-10-CM | POA: Insufficient documentation

## 2013-05-10 DIAGNOSIS — F17200 Nicotine dependence, unspecified, uncomplicated: Secondary | ICD-10-CM | POA: Insufficient documentation

## 2013-05-10 MED ORDER — LIDOCAINE HCL (PF) 1 % IJ SOLN
INTRAMUSCULAR | Status: AC
  Filled 2013-05-10: qty 5

## 2013-05-10 MED ORDER — DOXYCYCLINE HYCLATE 100 MG PO TABS
100.0000 mg | ORAL_TABLET | Freq: Once | ORAL | Status: AC
  Administered 2013-05-10: 100 mg via ORAL
  Filled 2013-05-10: qty 1

## 2013-05-10 MED ORDER — DOXYCYCLINE HYCLATE 100 MG PO CAPS: 100.0000 mg | ORAL_CAPSULE | Freq: Two times a day (BID) | ORAL | Status: DC

## 2013-05-10 MED ORDER — OXYCODONE-ACETAMINOPHEN 5-325 MG PO TABS
1.0000 | ORAL_TABLET | Freq: Once | ORAL | Status: AC
  Administered 2013-05-10: 1 via ORAL
  Filled 2013-05-10: qty 1

## 2013-05-10 MED ORDER — HYDROCODONE-ACETAMINOPHEN 5-325 MG PO TABS: ORAL_TABLET | ORAL | Status: DC

## 2013-05-10 NOTE — ED Provider Notes (Signed)
CSN: [id number]     Arrival date & time 05/10/13  1617 History     First MD Initiated Contact with Patient 05/10/13 1656     Chief Complaint  Patient presents with  . Abscess   (Consider location/radiation/quality/duration/timing/severity/associated sxs/prior Treatment) Patient is a 42 y.o. female presenting with abscess. The history is provided by the patient.  Abscess Location:  Face Facial abscess location:  Face Abscess quality: fluctuance, induration, painful and redness   Red streaking: no   Duration:  4 days Progression:  Worsening Pain details:    Quality:  Throbbing   Severity:  Moderate   Timing:  Constant   Progression:  Worsening Chronicity:  New Context: not diabetes   Relieved by:  Nothing Worsened by:  Nothing tried Ineffective treatments:  None tried Associated symptoms: no fever, no headaches, no nausea and no vomiting     Past Medical History  Diagnosis Date  . Pregnancy   . SVD (spontaneous vaginal delivery)     x 1  . Headache(784.0)   . Fibromyalgia   . Arthritis     hands, knees   Past Surgical History  Procedure Laterality Date  . Cesarean section      x 4  . Wisdom tooth extraction    . Right thumb surgery      pins   No family history on file. History  Substance Use Topics  . Smoking status: Current Some Day Smoker -- 0.50 packs/day for 10 years    Types: Cigarettes  . Smokeless tobacco: Never Used  . Alcohol Use: No   OB History   Grav Para Term Preterm Abortions TAB SAB Ect Mult Living   7 6 6  1  1   6      Review of Systems  Constitutional: Negative for fever and chills.  Gastrointestinal: Negative for nausea and vomiting.  Musculoskeletal: Negative for joint swelling and arthralgias.  Skin: Positive for color change.       Abscess   Neurological: Negative for headaches.  Hematological: Negative for adenopathy.  All other systems reviewed and are negative.    Allergies  Aspirin; Latex; and Penicillins  Home  Medications   Current Outpatient Rx  Name  Route  Sig  Dispense  Refill  . lansoprazole (PREVACID) 30 MG capsule   Oral   Take 30 mg by mouth every morning.          BP 127/85  Pulse 75  Temp(Src) 98.4 F (36.9 C) (Oral)  Resp 18  Ht 5\' 6"  (1.676 m)  Wt 170 lb (77.111 kg)  BMI 27.45 kg/m2  SpO2 99%  LMP 05/04/2013 Physical Exam  Nursing note and vitals reviewed. Constitutional: She is oriented to person, place, and time. She appears well-developed and well-nourished. No distress.  HENT:  Head: Normocephalic and atraumatic.    Mouth/Throat: Uvula is midline, oropharynx is clear and moist and mucous membranes are normal.  Neck: Normal range of motion. Neck supple.  Cardiovascular: Normal rate, regular rhythm, normal heart sounds and intact distal pulses.   No murmur heard. Pulmonary/Chest: Effort normal and breath sounds normal.  Musculoskeletal: Normal range of motion.  Lymphadenopathy:    She has no cervical adenopathy.  Neurological: She is alert and oriented to person, place, and time. She exhibits normal muscle tone. Coordination normal.  Skin: Skin is warm.    ED Course   Procedures (including critical care time)  Labs Reviewed - No data to display No results found. No diagnosis found.  MDM    INCISION AND DRAINAGE Performed by: Maxwell Caul. Consent: Verbal consent obtained. Risks and benefits: risks, benefits and alternatives were discussed Type: abscess  Body area: right lower face  First preformed Needle aspiration using 18 g with purulent aspirate, then I&D performed   Anesthesia: local infiltration  Incision was made with a #11 scalpel.  Local anesthetic: lidocaine 1% w/o epinephrine  Anesthetic total: 2 ml  Complexity: complex Blunt dissection to break up loculations  Drainage: purulent  Drainage amount: small   Patient tolerance: Patient tolerated the procedure well with no immediate complications.    Pt agrees to warm  compresses , doxy, and to return here if sx's worsen.  VSS.  Appears stable for discharge   Omari Mcmanaway L. Desmen Schoffstall, PA-C 05/14/13 1512

## 2013-05-10 NOTE — ED Notes (Signed)
Lesion on right side of face next to mouth

## 2013-05-16 ENCOUNTER — Encounter (HOSPITAL_COMMUNITY): Payer: Self-pay | Admitting: Emergency Medicine

## 2013-05-16 ENCOUNTER — Emergency Department (HOSPITAL_COMMUNITY)
Admission: EM | Admit: 2013-05-16 | Discharge: 2013-05-16 | Disposition: A | Discharge status: Home / Self Care | Payer: Medicare Other | Attending: Emergency Medicine | Admitting: Emergency Medicine

## 2013-05-16 DIAGNOSIS — Z880 Allergy status to penicillin: Secondary | ICD-10-CM | POA: Insufficient documentation

## 2013-05-16 DIAGNOSIS — F17200 Nicotine dependence, unspecified, uncomplicated: Secondary | ICD-10-CM | POA: Insufficient documentation

## 2013-05-16 DIAGNOSIS — L0201 Cutaneous abscess of face: Secondary | ICD-10-CM | POA: Insufficient documentation

## 2013-05-16 DIAGNOSIS — Z91040 Latex allergy status: Secondary | ICD-10-CM | POA: Insufficient documentation

## 2013-05-16 DIAGNOSIS — IMO0001 Reserved for inherently not codable concepts without codable children: Secondary | ICD-10-CM | POA: Insufficient documentation

## 2013-05-16 DIAGNOSIS — R51 Headache: Secondary | ICD-10-CM | POA: Insufficient documentation

## 2013-05-16 DIAGNOSIS — H109 Unspecified conjunctivitis: Secondary | ICD-10-CM

## 2013-05-16 DIAGNOSIS — Z79899 Other long term (current) drug therapy: Secondary | ICD-10-CM | POA: Insufficient documentation

## 2013-05-16 DIAGNOSIS — R220 Localized swelling, mass and lump, head: Secondary | ICD-10-CM | POA: Insufficient documentation

## 2013-05-16 DIAGNOSIS — L03211 Cellulitis of face: Secondary | ICD-10-CM | POA: Insufficient documentation

## 2013-05-16 DIAGNOSIS — M129 Arthropathy, unspecified: Secondary | ICD-10-CM | POA: Insufficient documentation

## 2013-05-16 MED ORDER — HYDROCODONE-ACETAMINOPHEN 5-325 MG PO TABS: 1.0000 | ORAL_TABLET | ORAL | Status: DC | PRN

## 2013-05-16 MED ORDER — CIPROFLOXACIN HCL 250 MG PO TABS
500.0000 mg | ORAL_TABLET | Freq: Once | ORAL | Status: AC
  Administered 2013-05-16: 500 mg via ORAL
  Filled 2013-05-16: qty 2

## 2013-05-16 MED ORDER — DOXYCYCLINE HYCLATE 100 MG PO TABS
100.0000 mg | ORAL_TABLET | Freq: Once | ORAL | Status: AC
  Administered 2013-05-16: 100 mg via ORAL
  Filled 2013-05-16: qty 1

## 2013-05-16 MED ORDER — ONDANSETRON HCL 4 MG PO TABS
4.0000 mg | ORAL_TABLET | Freq: Once | ORAL | Status: AC
  Administered 2013-05-16: 4 mg via ORAL
  Filled 2013-05-16: qty 1

## 2013-05-16 MED ORDER — CIPROFLOXACIN HCL 500 MG PO TABS: 500.0000 mg | ORAL_TABLET | Freq: Two times a day (BID) | ORAL | Status: DC

## 2013-05-16 MED ORDER — DOXYCYCLINE HYCLATE 100 MG PO CAPS: 100.0000 mg | ORAL_CAPSULE | Freq: Two times a day (BID) | ORAL | Status: AC

## 2013-05-16 MED ORDER — TOBRAMYCIN 0.3 % OP SOLN
2.0000 [drp] | Freq: Once | OPHTHALMIC | Status: AC
  Administered 2013-05-16: 2 [drp] via OPHTHALMIC
  Filled 2013-05-16: qty 5

## 2013-05-16 NOTE — ED Provider Notes (Signed)
CSN: [id number]     Arrival date & time 05/16/13  1858 History     First MD Initiated Contact with Patient 05/16/13 1915     Chief Complaint  Patient presents with  . Wound Check   (Consider location/radiation/quality/duration/timing/severity/associated sxs/prior Treatment) HPI Comments: Pt was seen in the ED 8/5 for evaluation of abscess of the right lower face. Pt underwent I and D of the area. She was treated with warm compresses, doxycycline and norco. Pt states the right face is sore, the right eye is red, and the abscess area is raised and sore again. She continues to try heat, but this is not successful. No reported fever. Pt discribes occasional throbbing of the right face.  Patient is a 42 y.o. female presenting with wound check. The history is provided by the patient.  Wound Check Associated symptoms include arthralgias and headaches. Pertinent negatives include no abdominal pain, chest pain, coughing or neck pain.    Past Medical History  Diagnosis Date  . Pregnancy   . SVD (spontaneous vaginal delivery)     x 1  . Headache(784.0)   . Fibromyalgia   . Arthritis     hands, knees   Past Surgical History  Procedure Laterality Date  . Cesarean section      x 4  . Wisdom tooth extraction    . Right thumb surgery      pins   History reviewed. No pertinent family history. History  Substance Use Topics  . Smoking status: Current Some Day Smoker -- 0.50 packs/day for 10 years    Types: Cigarettes  . Smokeless tobacco: Never Used  . Alcohol Use: No   OB History   Grav Para Term Preterm Abortions TAB SAB Ect Mult Living   7 6 6  1  1   6      Review of Systems  Constitutional: Negative for activity change.       All ROS Neg except as noted in HPI  HENT: Positive for facial swelling and dental problem. Negative for nosebleeds and neck pain.   Eyes: Negative for photophobia and discharge.  Respiratory: Negative for cough, shortness of breath and wheezing.     Cardiovascular: Negative for chest pain and palpitations.  Gastrointestinal: Negative for abdominal pain and blood in stool.  Genitourinary: Negative for dysuria, frequency and hematuria.  Musculoskeletal: Positive for arthralgias. Negative for back pain.  Skin: Negative.   Neurological: Positive for headaches. Negative for dizziness, seizures and speech difficulty.  Psychiatric/Behavioral: Negative for hallucinations and confusion.    Allergies  Aspirin; Latex; and Penicillins  Home Medications   Current Outpatient Rx  Name  Route  Sig  Dispense  Refill  . doxycycline (VIBRAMYCIN) 100 MG capsule   Oral   Take 1 capsule (100 mg total) by mouth 2 (two) times daily. For 10 days   20 capsule   0   . HYDROcodone-acetaminophen (NORCO/VICODIN) 5-325 MG per tablet      Take one-two tabs po q 4-6 hrs prn pain   20 tablet   0   . lansoprazole (PREVACID) 30 MG capsule   Oral   Take 30 mg by mouth every morning.          BP 151/83  Pulse 77  Temp(Src) 98.4 F (36.9 C) (Oral)  Resp 20  Ht 5\' 6"  (1.676 m)  Wt 170 lb (77.111 kg)  BMI 27.45 kg/m2  SpO2 100%  LMP 05/04/2013 Physical Exam  Nursing note and vitals reviewed. Constitutional:  She is oriented to person, place, and time. She appears well-developed and well-nourished.  Non-toxic appearance.  HENT:  Head: Normocephalic.    Right Ear: Tympanic membrane and external ear normal.  Left Ear: Tympanic membrane and external ear normal.  The right face/cheek is sore. No hot areas. Abscess of the right lower face. No drainage or red streaking.  Eyes: EOM and lids are normal. Pupils are equal, round, and reactive to light. Right eye exhibits no discharge and no exudate. Right conjunctiva is injected. Left conjunctiva is not injected.  Neck: Normal range of motion. Neck supple. Carotid bruit is not present.  Cardiovascular: Normal rate, regular rhythm, normal heart sounds, intact distal pulses and normal pulses.    Pulmonary/Chest: Breath sounds normal. No respiratory distress.  Abdominal: Soft. Bowel sounds are normal. There is no tenderness. There is no guarding.  Musculoskeletal: Normal range of motion.  Lymphadenopathy:       Head (right side): No submandibular adenopathy present.       Head (left side): No submandibular adenopathy present.    She has no cervical adenopathy.  Neurological: She is alert and oriented to person, place, and time. She has normal strength. No cranial nerve deficit or sensory deficit.  Skin: Skin is warm and dry.  Psychiatric: She has a normal mood and affect. Her speech is normal.    ED Course   Procedures (including critical care time)  Labs Reviewed - No data to display No results found. No diagnosis found.  MDM  **I have reviewed nursing notes, vital signs, and all appropriate lab and imaging results for this patient.* Vital signs stable. The I and D site of the right face is sore and now there is pain of the right face. No communication of the abscess with the inner cheek or lip. There is increase redness of the right conjunctiva. Plan - tobramycin eye drop to the right eye. Will continue warm compresses to the abscess area. Surgical evaluation since the abscess area has refilled and to painful. Continue doxycycline, add cipro ( pt is pen allergic). Rx for norco for pain.  Kathie Dike, PA-C 05/16/13 2008

## 2013-05-16 NOTE — ED Notes (Signed)
Pt has swollen area bedside mouth on rt.  Seen here recently for same., but no better.

## 2013-05-16 NOTE — ED Notes (Signed)
Pt is here to have abscess rechecked on the rt side of face. Pt was told to come back if the area did not get better.

## 2013-05-17 ENCOUNTER — Other Ambulatory Visit: Payer: Self-pay | Admitting: Adult Health

## 2013-05-17 NOTE — ED Provider Notes (Signed)
Medical screening examination/treatment/procedure(s) were performed by non-physician practitioner and as supervising physician I was immediately available for consultation/collaboration.   Aletheia Tangredi L Jinnifer Montejano, MD 05/17/13 1524 

## 2013-10-27 ENCOUNTER — Encounter (HOSPITAL_COMMUNITY): Payer: Self-pay | Admitting: Emergency Medicine

## 2013-10-27 ENCOUNTER — Emergency Department (HOSPITAL_COMMUNITY)
Admission: EM | Admit: 2013-10-27 | Discharge: 2013-10-27 | Disposition: A | Discharge status: Home / Self Care | Payer: Medicare Other | Attending: Emergency Medicine | Admitting: Emergency Medicine

## 2013-10-27 DIAGNOSIS — R059 Cough, unspecified: Secondary | ICD-10-CM

## 2013-10-27 DIAGNOSIS — Z880 Allergy status to penicillin: Secondary | ICD-10-CM | POA: Insufficient documentation

## 2013-10-27 DIAGNOSIS — Z79899 Other long term (current) drug therapy: Secondary | ICD-10-CM | POA: Insufficient documentation

## 2013-10-27 DIAGNOSIS — IMO0001 Reserved for inherently not codable concepts without codable children: Secondary | ICD-10-CM | POA: Insufficient documentation

## 2013-10-27 DIAGNOSIS — J3489 Other specified disorders of nose and nasal sinuses: Secondary | ICD-10-CM | POA: Insufficient documentation

## 2013-10-27 DIAGNOSIS — R6883 Chills (without fever): Secondary | ICD-10-CM | POA: Insufficient documentation

## 2013-10-27 DIAGNOSIS — F17200 Nicotine dependence, unspecified, uncomplicated: Secondary | ICD-10-CM | POA: Insufficient documentation

## 2013-10-27 DIAGNOSIS — Z792 Long term (current) use of antibiotics: Secondary | ICD-10-CM | POA: Insufficient documentation

## 2013-10-27 DIAGNOSIS — Z91040 Latex allergy status: Secondary | ICD-10-CM | POA: Insufficient documentation

## 2013-10-27 DIAGNOSIS — M129 Arthropathy, unspecified: Secondary | ICD-10-CM | POA: Insufficient documentation

## 2013-10-27 DIAGNOSIS — R05 Cough: Secondary | ICD-10-CM

## 2013-10-27 MED ORDER — GUAIFENESIN-CODEINE 100-10 MG/5ML PO SOLN
10.0000 mL | Freq: Three times a day (TID) | ORAL | Status: DC | PRN
Start: 2013-10-27 — End: 2015-01-08

## 2013-10-27 MED ORDER — PREDNISONE 10 MG PO TABS: ORAL_TABLET | ORAL | Status: DC

## 2013-10-27 MED ORDER — ALBUTEROL SULFATE HFA 108 (90 BASE) MCG/ACT IN AERS
2.0000 | INHALATION_SPRAY | Freq: Once | RESPIRATORY_TRACT | Status: AC
  Administered 2013-10-27: 2 via RESPIRATORY_TRACT
  Filled 2013-10-27: qty 6.7

## 2013-10-27 MED ORDER — PREDNISONE 50 MG PO TABS
60.0000 mg | ORAL_TABLET | Freq: Once | ORAL | Status: AC
  Administered 2013-10-27: 60 mg via ORAL
  Filled 2013-10-27 (×2): qty 1

## 2013-10-27 NOTE — ED Provider Notes (Signed)
CSN: [id number]     Arrival date & time 10/27/13  1516 History   First MD Initiated Contact with Patient 10/27/13 1549     Chief Complaint  Patient presents with  . Cough   (Consider location/radiation/quality/duration/timing/severity/associated sxs/prior Treatment) Patient is a 43 y.o. female presenting with cough. The history is provided by the patient.  Cough Cough characteristics:  Productive Sputum characteristics:  Yellow and green Severity:  Moderate Onset quality:  Gradual Duration:  3 days Timing:  Intermittent Chronicity:  New Smoker: yes   Context: sick contacts and upper respiratory infection   Relieved by:  Nothing Worsened by:  Nothing tried Ineffective treatments:  None tried Associated symptoms: chills, myalgias, rhinorrhea and sinus congestion   Associated symptoms: no chest pain, no ear pain, no fever, no headaches, no rash, no shortness of breath, no sore throat and no wheezing   Myalgias:    Location:  Generalized   Quality:  Aching   Severity:  Moderate   Onset quality:  Gradual   Duration:  3 days   Timing:  Constant   Progression:  Unchanged   Past Medical History  Diagnosis Date  . Pregnancy   . SVD (spontaneous vaginal delivery)     x 1  . Headache(784.0)   . Fibromyalgia   . Arthritis     hands, knees   Past Surgical History  Procedure Laterality Date  . Cesarean section      x 4  . Wisdom tooth extraction    . Right thumb surgery      pins   Family History  Problem Relation Age of Onset  . Diabetes Other   . Heart failure Other   . Hypertension Other   . Cancer Other    History  Substance Use Topics  . Smoking status: Current Some Day Smoker -- 0.50 packs/day for 10 years    Types: Cigarettes  . Smokeless tobacco: Never Used  . Alcohol Use: No   OB History   Grav Para Term Preterm Abortions TAB SAB Ect Mult Living   7 6 6  1  1   6      Review of Systems  Constitutional: Positive for chills. Negative for fever, activity  change and appetite change.  HENT: Positive for congestion and rhinorrhea. Negative for ear pain, facial swelling, sore throat and trouble swallowing.   Eyes: Negative for visual disturbance.  Respiratory: Positive for cough. Negative for chest tightness, shortness of breath, wheezing and stridor.   Cardiovascular: Negative for chest pain.  Gastrointestinal: Negative for nausea and vomiting.  Musculoskeletal: Positive for myalgias. Negative for back pain, neck pain and neck stiffness.  Skin: Negative.  Negative for rash.  Neurological: Negative for dizziness, weakness, numbness and headaches.  Hematological: Negative for adenopathy.  Psychiatric/Behavioral: Negative for confusion.  All other systems reviewed and are negative.    Allergies  Aspirin; Latex; and Penicillins  Home Medications   Current Outpatient Rx  Name  Route  Sig  Dispense  Refill  . ciprofloxacin (CIPRO) 500 MG tablet   Oral   Take 1 tablet (500 mg total) by mouth 2 (two) times daily.   14 tablet   0   . doxycycline (VIBRAMYCIN) 100 MG capsule   Oral   Take 1 capsule (100 mg total) by mouth 2 (two) times daily. For 10 days   20 capsule   0   . guaiFENesin-codeine 100-10 MG/5ML syrup   Oral   Take 10 mLs by mouth 3 (three)  times daily as needed for cough.   120 mL   0   . HYDROcodone-acetaminophen (NORCO/VICODIN) 5-325 MG per tablet      Take one-two tabs po q 4-6 hrs prn pain   20 tablet   0   . HYDROcodone-acetaminophen (NORCO/VICODIN) 5-325 MG per tablet   Oral   Take 1 tablet by mouth every 4 (four) hours as needed for pain.   20 tablet   0   . lansoprazole (PREVACID) 30 MG capsule   Oral   Take 30 mg by mouth every morning.         . ondansetron (ZOFRAN) 8 MG tablet      TAKE 1 TABLET BY MOUTH EVERY 8-12 HOURS AS NEEDED FOR NAUSEA/VOMITING.   15 tablet   1   . predniSONE (DELTASONE) 10 MG tablet      Take 6 tablets day one, 5 tablets day two, 4 tablets day three, 3 tablets day  four, 2 tablets day five, then 1 tablet day six   21 tablet   0    BP 118/80  Pulse 86  Temp(Src) 98.6 F (37 C) (Oral)  Ht 5\' 6"  (1.676 m)  Wt 140 lb (63.504 kg)  BMI 22.61 kg/m2  SpO2 98%  LMP 10/05/2013  Physical Exam  Nursing note and vitals reviewed. Constitutional: She is oriented to person, place, and time. She appears well-developed and well-nourished. No distress.  HENT:  Head: Normocephalic and atraumatic.  Right Ear: Tympanic membrane and ear canal normal.  Left Ear: Tympanic membrane and ear canal normal.  Nose: Mucosal edema and rhinorrhea present.  Mouth/Throat: Uvula is midline, oropharynx is clear and moist and mucous membranes are normal. No oropharyngeal exudate.  Eyes: EOM are normal. Pupils are equal, round, and reactive to light.  Neck: Normal range of motion, full passive range of motion without pain and phonation normal. Neck supple.  Cardiovascular: Normal rate, regular rhythm, normal heart sounds and intact distal pulses.   No murmur heard. Pulmonary/Chest: Effort normal. No stridor. No respiratory distress. She has no rales. She exhibits no tenderness.  Coarse lungs sounds bilaterally with few scattered expiratory wheezes.  No rales  Musculoskeletal: Normal range of motion. She exhibits no edema.  Lymphadenopathy:    She has no cervical adenopathy.  Neurological: She is alert and oriented to person, place, and time. She exhibits normal muscle tone. Coordination normal.  Skin: Skin is warm and dry.    ED Course  Procedures (including critical care time) Labs Review Labs Reviewed - No data to display Imaging Review No results found.  EKG Interpretation   None       MDM   1. Cough    VSS.  No tachypnea, hypoxia, or tachycardia.  Patient is mother of 5 children who all have similar symptoms and are here for treatment.    Albuterol inhaler dispensed.  Sx's likely viral.  Patient agrees to treatment with prednisone taper and robitussin AC  for cough.  Patient's wheezes improved after inhaler use, she appears stable for discharge.   Felton Buczynski L. Vanessa Pocahontas, PA-C 10/27/13 2038

## 2013-10-27 NOTE — ED Provider Notes (Signed)
Medical screening examination/treatment/procedure(s) were performed by non-physician practitioner and as supervising physician I was immediately available for consultation/collaboration.  EKG Interpretation   None       Rolland Porter, MD, Abram Sander   Janice Norrie, MD 10/27/13 2205

## 2013-10-27 NOTE — ED Notes (Signed)
Cough, congestion and sinus tenderness x 3 days. Pt reports greenish colored sputum and nasal drainage.

## 2013-10-27 NOTE — Discharge Instructions (Signed)
Cough, Adult  A cough is a reflex. It helps you clear your throat and airways. A cough can help heal your body. A cough can last 2 or 3 weeks (acute) or may last more than 8 weeks (chronic). Some common causes of a cough can include an infection, allergy, or a cold. HOME CARE  Only take medicine as told by your doctor.  If given, take your medicines (antibiotics) as told. Finish them even if you start to feel better.  Use a cold steam vaporizer or humidier in your home. This can help loosen thick spit (secretions).  Sleep so you are almost sitting up (semi-upright). Use pillows to do this. This helps reduce coughing.  Rest as needed.  Stop smoking if you smoke. GET HELP RIGHT AWAY IF:  You have yellowish-white fluid (pus) in your thick spit.  Your cough gets worse.  Your medicine does not reduce coughing, and you are losing sleep.  You cough up blood.  You have trouble breathing.  Your pain gets worse and medicine does not help.  You have a fever. MAKE SURE YOU:   Understand these instructions.  Will watch your condition.  Will get help right away if you are not doing well or get worse. Document Released: 06/05/2011 Document Revised: 12/15/2011 Document Reviewed: 06/05/2011 [employer] Patient Information 2014 [employer].  

## 2014-01-26 ENCOUNTER — Ambulatory Visit: Payer: Medicare Other | Admitting: Advanced Practice Midwife

## 2014-02-07 ENCOUNTER — Encounter: Payer: Self-pay | Admitting: *Deleted

## 2014-02-07 ENCOUNTER — Ambulatory Visit: Payer: Medicaid Other | Admitting: Advanced Practice Midwife

## 2014-07-17 ENCOUNTER — Other Ambulatory Visit: Payer: Self-pay | Admitting: Obstetrics and Gynecology

## 2014-08-07 ENCOUNTER — Encounter (HOSPITAL_COMMUNITY): Payer: Self-pay | Admitting: Emergency Medicine

## 2014-08-29 ENCOUNTER — Encounter (HOSPITAL_COMMUNITY): Payer: Self-pay | Admitting: Emergency Medicine

## 2014-08-29 ENCOUNTER — Emergency Department (HOSPITAL_COMMUNITY)
Admission: EM | Admit: 2014-08-29 | Discharge: 2014-08-30 | Disposition: A | Discharge status: Home / Self Care | Payer: Medicare Other | Attending: Emergency Medicine | Admitting: Emergency Medicine

## 2014-08-29 DIAGNOSIS — Z720 Tobacco use: Secondary | ICD-10-CM | POA: Insufficient documentation

## 2014-08-29 DIAGNOSIS — Z792 Long term (current) use of antibiotics: Secondary | ICD-10-CM | POA: Diagnosis not present

## 2014-08-29 DIAGNOSIS — Z7952 Long term (current) use of systemic steroids: Secondary | ICD-10-CM | POA: Diagnosis not present

## 2014-08-29 DIAGNOSIS — M1990 Unspecified osteoarthritis, unspecified site: Secondary | ICD-10-CM | POA: Insufficient documentation

## 2014-08-29 DIAGNOSIS — B86 Scabies: Secondary | ICD-10-CM

## 2014-08-29 DIAGNOSIS — R21 Rash and other nonspecific skin eruption: Secondary | ICD-10-CM | POA: Diagnosis present

## 2014-08-29 DIAGNOSIS — Z880 Allergy status to penicillin: Secondary | ICD-10-CM | POA: Insufficient documentation

## 2014-08-29 DIAGNOSIS — Z91040 Latex allergy status: Secondary | ICD-10-CM | POA: Diagnosis not present

## 2014-08-29 DIAGNOSIS — Z79899 Other long term (current) drug therapy: Secondary | ICD-10-CM | POA: Diagnosis not present

## 2014-08-29 MED ORDER — PERMETHRIN 5 % EX CREA: TOPICAL_CREAM | CUTANEOUS | Status: DC

## 2014-08-29 NOTE — Discharge Instructions (Signed)

## 2014-08-29 NOTE — ED Notes (Signed)
Pt c/o rash to bilateral legs, arms stomach and back.

## 2014-08-30 ENCOUNTER — Other Ambulatory Visit: Payer: Self-pay | Admitting: Adult Health

## 2014-09-01 NOTE — ED Provider Notes (Signed)
CSN: [id number]     Arrival date & time 08/29/14  2229 History   First MD Initiated Contact with Patient 08/29/14 2316     Chief Complaint  Patient presents with  . Rash     (Consider location/radiation/quality/duration/timing/severity/associated sxs/prior Treatment) The history is provided by the patient.   [name] is a 43 y.o. female presenting with a 2 week history of persistent itchy rash was started shortly after staying in a hotel for several weeks with her family.  Her children are also here for evaluation of the same symptoms, which started with her middle son.  She reports itchy, non draining, non tender rash on her bilateral forearms, between her fingers, on her back and stomach.  She has taken no medicines for her symptoms and had found no alleviators.  She denies fevers, chills, nausea, sore throat or other complaints.     Past Medical History  Diagnosis Date  . Pregnancy   . SVD (spontaneous vaginal delivery)     x 1  . Headache(784.0)   . Fibromyalgia   . Arthritis     hands, knees   Past Surgical History  Procedure Laterality Date  . Cesarean section      x 4  . Wisdom tooth extraction    . Right thumb surgery      pins   Family History  Problem Relation Age of Onset  . Diabetes Other   . Heart failure Other   . Hypertension Other   . Cancer Other    History  Substance Use Topics  . Smoking status: Current Some Day Smoker -- 0.50 packs/day for 10 years    Types: Cigarettes  . Smokeless tobacco: Never Used  . Alcohol Use: No   OB History    Gravida Para Term Preterm AB TAB SAB Ectopic Multiple Living   7 6 6  1  1   6      Review of Systems  Constitutional: Negative for fever and chills.  Respiratory: Negative for shortness of breath and wheezing.   Skin: Positive for rash.  Neurological: Negative for numbness.      Allergies  Aspirin; Latex; and Penicillins  Home Medications   Prior to Admission medications   Medication Sig  Start Date End Date Taking? Authorizing Provider  acyclovir (ZOVIRAX) 400 MG tablet TAKE ONE TABLET BY MOUTH TWICE DAILY FOR 30 DAYS. 07/17/14   Jonnie Kind, MD  ciprofloxacin (CIPRO) 500 MG tablet Take 1 tablet (500 mg total) by mouth 2 (two) times daily. 05/16/13   Lenox Ahr, PA-C  doxycycline (VIBRAMYCIN) 100 MG capsule Take 1 capsule (100 mg total) by mouth 2 (two) times daily. For 10 days 05/10/13   [name], PA-C  guaiFENesin-codeine 100-10 MG/5ML syrup Take 10 mLs by mouth 3 (three) times daily as needed for cough. 10/27/13   [name], PA-C  HYDROcodone-acetaminophen (NORCO/VICODIN) 5-325 MG per tablet Take one-two tabs po q 4-6 hrs prn pain 05/10/13   [name], PA-C  HYDROcodone-acetaminophen (NORCO/VICODIN) 5-325 MG per tablet Take 1 tablet by mouth every 4 (four) hours as needed for pain. 05/16/13   Lenox Ahr, PA-C  lansoprazole (PREVACID) 30 MG capsule Take 30 mg by mouth every morning.    Historical Provider, MD  ondansetron (ZOFRAN) 8 MG tablet TAKE 1 TABLET BY MOUTH EVERY 8-12 HOURS AS NEEDED FOR NAUSEA/VOMITING. 05/17/13   Estill Dooms, NP  permethrin (ELIMITE) 5 % cream Apply as instructed leaving on  for 10 hours, then shower.  May repeat in 10 days if symptoms persist 08/29/14   Evalee Jefferson, PA-C  predniSONE (DELTASONE) 10 MG tablet Take 6 tablets day one, 5 tablets day two, 4 tablets day three, 3 tablets day four, 2 tablets day five, then 1 tablet day six 10/27/13   [name], PA-C   BP 126/82 mmHg  Pulse 75  Temp(Src) 98.3 F (36.8 C) (Oral)  Resp 20  Ht 5\' 6"  (1.676 m)  Wt 130 lb (58.968 kg)  BMI 20.99 kg/m2  SpO2 99% Physical Exam  Constitutional: She appears well-developed and well-nourished. No distress.  HENT:  Head: Normocephalic.  Neck: Neck supple.  Cardiovascular: Normal rate.   Pulmonary/Chest: Effort normal. She has no wheezes.  Musculoskeletal: Normal range of motion. She exhibits no edema.  Skin: Rash noted.  No purpura noted. Rash is papular. Rash is not macular, not pustular and not vesicular.  Excoriations present.  Rash on forearms, in between digits, elbow creases, abdomen, upper thighs. No drainage, no surrounding erythema.     ED Course  Procedures (including critical care time) Labs Review Labs Reviewed - No data to display  Imaging Review No results found.   EKG Interpretation None      MDM   Final diagnoses:  Scabies    permethrin with 1 refill for re treatment if sx persist or return in 10 days.  F/u with pcp prn.      Evalee Jefferson, PA-C 09/01/14 1017  Wynetta Fines, MD 09/01/14 2258

## 2014-09-20 ENCOUNTER — Emergency Department (HOSPITAL_COMMUNITY): Payer: Medicare Other

## 2014-09-20 ENCOUNTER — Encounter (HOSPITAL_COMMUNITY): Payer: Self-pay | Admitting: *Deleted

## 2014-09-20 ENCOUNTER — Emergency Department (HOSPITAL_COMMUNITY)
Admission: EM | Admit: 2014-09-20 | Discharge: 2014-09-20 | Disposition: A | Discharge status: Home / Self Care | Payer: Medicare Other | Attending: Emergency Medicine | Admitting: Emergency Medicine

## 2014-09-20 DIAGNOSIS — R079 Chest pain, unspecified: Secondary | ICD-10-CM | POA: Insufficient documentation

## 2014-09-20 DIAGNOSIS — Z720 Tobacco use: Secondary | ICD-10-CM | POA: Diagnosis not present

## 2014-09-20 DIAGNOSIS — R05 Cough: Secondary | ICD-10-CM | POA: Diagnosis present

## 2014-09-20 DIAGNOSIS — J069 Acute upper respiratory infection, unspecified: Secondary | ICD-10-CM | POA: Diagnosis not present

## 2014-09-20 DIAGNOSIS — Z79899 Other long term (current) drug therapy: Secondary | ICD-10-CM | POA: Diagnosis not present

## 2014-09-20 DIAGNOSIS — M1990 Unspecified osteoarthritis, unspecified site: Secondary | ICD-10-CM | POA: Diagnosis not present

## 2014-09-20 DIAGNOSIS — Z792 Long term (current) use of antibiotics: Secondary | ICD-10-CM | POA: Insufficient documentation

## 2014-09-20 DIAGNOSIS — B86 Scabies: Secondary | ICD-10-CM | POA: Insufficient documentation

## 2014-09-20 DIAGNOSIS — Z91040 Latex allergy status: Secondary | ICD-10-CM | POA: Insufficient documentation

## 2014-09-20 DIAGNOSIS — R059 Cough, unspecified: Secondary | ICD-10-CM

## 2014-09-20 DIAGNOSIS — Z7952 Long term (current) use of systemic steroids: Secondary | ICD-10-CM | POA: Diagnosis not present

## 2014-09-20 DIAGNOSIS — Z880 Allergy status to penicillin: Secondary | ICD-10-CM | POA: Insufficient documentation

## 2014-09-20 MED ORDER — PERMETHRIN 5 % EX CREA: TOPICAL_CREAM | CUTANEOUS | Status: DC

## 2014-09-20 MED ORDER — ALBUTEROL SULFATE HFA 108 (90 BASE) MCG/ACT IN AERS
2.0000 | INHALATION_SPRAY | Freq: Once | RESPIRATORY_TRACT | Status: AC
  Administered 2014-09-20: 2 via RESPIRATORY_TRACT
  Filled 2014-09-20: qty 6.7

## 2014-09-20 MED ORDER — ALBUTEROL SULFATE 1.25 MG/3ML IN NEBU: 1.0000 | INHALATION_SOLUTION | Freq: Four times a day (QID) | RESPIRATORY_TRACT | Status: DC | PRN

## 2014-09-20 MED ORDER — BENZONATATE 100 MG PO CAPS: 200.0000 mg | ORAL_CAPSULE | Freq: Three times a day (TID) | ORAL | Status: DC | PRN

## 2014-09-20 NOTE — Care Management Note (Signed)
ED/CM noted patient did not have health insurance and/or PCP listed in the computer.  Patient was given the [place] resource handout with information on the clinics, food pantries, and the handout for new health insurance sign-up.  Patient expressed appreciation for information received. 

## 2014-09-20 NOTE — Discharge Instructions (Signed)
Upper Respiratory Infection, Adult An upper respiratory infection (URI) is also known as the common cold. It is often caused by a type of germ (virus). Colds are easily spread (contagious). You can pass it to others by kissing, coughing, sneezing, or drinking out of the same glass. Usually, you get better in 1 or 2 weeks.  HOME CARE   Only take medicine as told by your doctor.  Use a warm mist humidifier or breathe in steam from a hot shower.  Drink enough water and fluids to keep your pee (urine) clear or pale yellow.  Get plenty of rest.  Return to work when your temperature is back to normal or as told by your doctor. You may use a face mask and wash your hands to stop your cold from spreading. GET HELP RIGHT AWAY IF:   After the first few days, you feel you are getting worse.  You have questions about your medicine.  You have chills, shortness of breath, or brown or red spit (mucus).  You have yellow or brown snot (nasal discharge) or pain in the face, especially when you bend forward.  You have a fever, puffy (swollen) neck, pain when you swallow, or white spots in the back of your throat.  You have a bad headache, ear pain, sinus pain, or chest pain.  You have a high-pitched whistling sound when you breathe in and out (wheezing).  You have a lasting cough or cough up blood.  You have sore muscles or a stiff neck. MAKE SURE YOU:   Understand these instructions.  Will watch your condition.  Will get help right away if you are not doing well or get worse. Document Released: 03/10/2008 Document Revised: 12/15/2011 Document Reviewed: 12/28/2013 [facility] Patient Information 2015 [place], [place]. This information is not intended to replace advice given to you by your health care provider. Make sure you discuss any questions you have with your health care provider.  Scabies Scabies are small bugs (mites) that burrow under the skin and cause red bumps and severe itching. These  bugs can only be seen with a microscope. Scabies are highly contagious. They can spread easily from person to person by direct contact. They are also spread through sharing clothing or linens that have the scabies mites living in them. It is not unusual for an entire family to become infected through shared towels, clothing, or bedding.  HOME CARE INSTRUCTIONS   Your caregiver may prescribe a cream or lotion to kill the mites. If cream is prescribed, massage the cream into the entire body from the neck to the bottom of both feet. Also massage the cream into the scalp and face if your child is less than 35 year old. Avoid the eyes and mouth. Do not wash your hands after application.  Leave the cream on for 8 to 12 hours. Your child should bathe or shower after the 8 to 12 hour application period. Sometimes it is helpful to apply the cream to your child right before bedtime.  One treatment is usually effective and will eliminate approximately 95% of infestations. For severe cases, your caregiver may decide to repeat the treatment in 1 week. Everyone in your household should be treated with one application of the cream.  New rashes or burrows should not appear within 24 to 48 hours after successful treatment. However, the itching and rash may last for 2 to 4 weeks after successful treatment. Your caregiver may prescribe a medicine to help with the itching or  to help the rash go away more quickly.  Scabies can live on clothing or linens for up to 3 days. All of your child's recently used clothing, towels, stuffed toys, and bed linens should be washed in hot water and then dried in a dryer for at least 20 minutes on high heat. Items that cannot be washed should be enclosed in a plastic bag for at least 3 days.  To help relieve itching, bathe your child in a cool bath or apply cool washcloths to the affected areas.  Your child may return to school after treatment with the prescribed cream. SEEK MEDICAL CARE  IF:   The itching persists longer than 4 weeks after treatment.  The rash spreads or becomes infected. Signs of infection include red blisters or yellow-tan crust. Document Released: 09/22/2005 Document Revised: 12/15/2011 Document Reviewed: 01/31/2009 [facility] Patient Information 2015 [place], [place]. This information is not intended to replace advice given to you by your health care provider. Make sure you discuss any questions you have with your health care provider.

## 2014-09-20 NOTE — ED Notes (Signed)
Congestion, productive cough, yellow in color. Sneezing. Symptoms began yesterday. 4 other family members being seen for the same. Discomfort to chest when coughing.

## 2014-09-20 NOTE — ED Notes (Signed)
Patient given discharge instruction, verbalized understand. Patient ambulatory out of the department.  

## 2014-09-22 NOTE — ED Provider Notes (Signed)
CSN: [id number]     Arrival date & time 09/20/14  1058 History   First MD Initiated Contact with Patient 09/20/14 1120     Chief Complaint  Patient presents with  . URI     (Consider location/radiation/quality/duration/timing/severity/associated sxs/prior Treatment) The history is provided by the patient.   [name] is a 43 y.o. female presenting with a one  day history of uri type symptoms which includes nasal congestion with clear rhinorrhea, sore throat, sneezing and  thick yellow sputum productive cough.  She denies fevers. Symptoms due to not include shortness of breath, chest pain,  Nausea, vomiting or diarrhea.  She have a midsternal burning pain triggered by coughing.  The patient has taken no medicines prior to arrival for her symptoms.  Also describes an itchy rash on her forearms similar to the rash she was seen here last month when she and her children underwent scabies treatment.  Her symptoms improved but have now returned since her nephew who was in jail returned to the home last week with similar rash.      Past Medical History  Diagnosis Date  . Pregnancy   . SVD (spontaneous vaginal delivery)     x 1  . Headache(784.0)   . Fibromyalgia   . Arthritis     hands, knees   Past Surgical History  Procedure Laterality Date  . Cesarean section      x 4  . Wisdom tooth extraction    . Right thumb surgery      pins   Family History  Problem Relation Age of Onset  . Diabetes Other   . Heart failure Other   . Hypertension Other   . Cancer Other    History  Substance Use Topics  . Smoking status: Current Some Day Smoker -- 0.50 packs/day for 10 years    Types: Cigarettes  . Smokeless tobacco: Never Used  . Alcohol Use: No   OB History    Gravida Para Term Preterm AB TAB SAB Ectopic Multiple Living   7 6 6  1  1   6      Review of Systems  Constitutional: Positive for fever and chills.  HENT: Positive for congestion, rhinorrhea, sneezing and sore  throat. Negative for ear pain, sinus pressure, trouble swallowing and voice change.   Eyes: Negative for discharge.  Respiratory: Positive for cough. Negative for shortness of breath, wheezing and stridor.   Cardiovascular: Positive for chest pain.  Gastrointestinal: Negative for abdominal pain.  Genitourinary: Negative.   Skin: Positive for rash.      Allergies  Aspirin; Latex; and Penicillins  Home Medications   Prior to Admission medications   Medication Sig Start Date End Date Taking? Authorizing Provider  acyclovir (ZOVIRAX) 400 MG tablet TAKE ONE TABLET BY MOUTH TWICE DAILY FOR 30 DAYS. 07/17/14   Jonnie Kind, MD  albuterol (ACCUNEB) 1.25 MG/3ML nebulizer solution Take 3 mLs (1.25 mg total) by nebulization every 6 (six) hours as needed for wheezing. 09/20/14   Evalee Jefferson, PA-C  benzonatate (TESSALON) 100 MG capsule Take 2 capsules (200 mg total) by mouth 3 (three) times daily as needed for cough. 09/20/14   Evalee Jefferson, PA-C  ciprofloxacin (CIPRO) 500 MG tablet Take 1 tablet (500 mg total) by mouth 2 (two) times daily. 05/16/13   Lenox Ahr, PA-C  doxycycline (VIBRAMYCIN) 100 MG capsule Take 1 capsule (100 mg total) by mouth 2 (two) times daily. For 10 days 05/10/13   Tammy  L. Triplett, PA-C  guaiFENesin-codeine 100-10 MG/5ML syrup Take 10 mLs by mouth 3 (three) times daily as needed for cough. 10/27/13   [name], PA-C  HYDROcodone-acetaminophen (NORCO/VICODIN) 5-325 MG per tablet Take one-two tabs po q 4-6 hrs prn pain 05/10/13   [name], PA-C  HYDROcodone-acetaminophen (NORCO/VICODIN) 5-325 MG per tablet Take 1 tablet by mouth every 4 (four) hours as needed for pain. 05/16/13   Lenox Ahr, PA-C  lansoprazole (PREVACID) 30 MG capsule TAKE ONE CAPSULE BY MOUTH ONCE DAILY. 09/04/14   Estill Dooms, NP  ondansetron (ZOFRAN) 8 MG tablet TAKE 1 TABLET BY MOUTH EVERY 8-12 HOURS AS NEEDED FOR NAUSEA/VOMITING. 05/17/13   Estill Dooms, NP  permethrin  (ELIMITE) 5 % cream Apply head to toe, avoiding eyes and wash off after 10 hours.  May repeat in 10 days if needed 09/20/14   Evalee Jefferson, PA-C  predniSONE (DELTASONE) 10 MG tablet Take 6 tablets day one, 5 tablets day two, 4 tablets day three, 3 tablets day four, 2 tablets day five, then 1 tablet day six 10/27/13   [name], PA-C   SpO2 99%  LMP 09/04/2014 Physical Exam  Constitutional: She is oriented to person, place, and time. She appears well-developed and well-nourished. No distress.  HENT:  Head: Normocephalic and atraumatic.  Right Ear: Tympanic membrane and ear canal normal.  Left Ear: Tympanic membrane and ear canal normal.  Nose: Mucosal edema and rhinorrhea present.  Mouth/Throat: Uvula is midline, oropharynx is clear and moist and mucous membranes are normal. No oropharyngeal exudate, posterior oropharyngeal edema, posterior oropharyngeal erythema or tonsillar abscesses.  Eyes: Conjunctivae are normal.  Neck: Neck supple.  Cardiovascular: Normal rate and normal heart sounds.   Pulmonary/Chest: Effort normal. No respiratory distress. She has wheezes in the right upper field. She has no rales.  Intermittent mild wheeze with expiration right upper lung field.  Abdominal: Soft. There is no tenderness.  Musculoskeletal: Normal range of motion. She exhibits no edema.  Neurological: She is alert and oriented to person, place, and time.  Skin: Skin is warm and dry. Rash noted. Rash is papular.  Forearms and hands only including finger web spaces.  Excoriations, scabbing present.  No pustules, no surrounding erythema.    Psychiatric: She has a normal mood and affect.    ED Course  Procedures (including critical care time) Labs Review Labs Reviewed - No data to display  Imaging Review No results found.   EKG Interpretation None      MDM   Final diagnoses:  Cough  Acute URI  Scabies    Patients labs and/or radiological studies were viewed and considered during  the medical decision making and disposition process. Pt was given albuterol mdi, also requested albuterol neb refills.  Tessalon for cough, permethrin for suspected return of scabies with 1 refill.  Instructions given for washing bedding, clothing.  Other family members also here and will receive tx.    The patient appears reasonably screened and/or stabilized for discharge and I doubt any other medical condition or other [facility] requiring further screening, evaluation, or treatment in the ED at this time prior to discharge.     Evalee Jefferson, PA-C 09/22/14 1828  Maudry Diego, MD 09/28/14 [phone]

## 2014-09-30 ENCOUNTER — Encounter (HOSPITAL_COMMUNITY): Payer: Self-pay | Admitting: Emergency Medicine

## 2014-09-30 ENCOUNTER — Emergency Department (HOSPITAL_COMMUNITY)
Admission: EM | Admit: 2014-09-30 | Discharge: 2014-09-30 | Discharge status: Against Medical Advice | Payer: Medicare Other | Attending: Emergency Medicine | Admitting: Emergency Medicine

## 2014-09-30 DIAGNOSIS — E860 Dehydration: Secondary | ICD-10-CM | POA: Insufficient documentation

## 2014-09-30 DIAGNOSIS — R51 Headache: Secondary | ICD-10-CM | POA: Insufficient documentation

## 2014-09-30 DIAGNOSIS — Z720 Tobacco use: Secondary | ICD-10-CM | POA: Insufficient documentation

## 2014-09-30 NOTE — ED Notes (Signed)
Called x 3 pt not in waiting room

## 2014-09-30 NOTE — ED Notes (Signed)
Pt reports head pain with sinus pain. Pt also feels like she is dehydrated from being sick a few days ago.

## 2014-10-28 ENCOUNTER — Emergency Department (HOSPITAL_COMMUNITY)
Admission: EM | Admit: 2014-10-28 | Discharge: 2014-10-28 | Disposition: A | Discharge status: Home / Self Care | Payer: Medicare Other | Attending: Emergency Medicine | Admitting: Emergency Medicine

## 2014-10-28 ENCOUNTER — Encounter (HOSPITAL_COMMUNITY): Payer: Self-pay | Admitting: Emergency Medicine

## 2014-10-28 ENCOUNTER — Emergency Department (HOSPITAL_COMMUNITY): Payer: Medicare Other

## 2014-10-28 DIAGNOSIS — M1990 Unspecified osteoarthritis, unspecified site: Secondary | ICD-10-CM | POA: Diagnosis not present

## 2014-10-28 DIAGNOSIS — Z880 Allergy status to penicillin: Secondary | ICD-10-CM | POA: Insufficient documentation

## 2014-10-28 DIAGNOSIS — Z792 Long term (current) use of antibiotics: Secondary | ICD-10-CM | POA: Insufficient documentation

## 2014-10-28 DIAGNOSIS — S52202A Unspecified fracture of shaft of left ulna, initial encounter for closed fracture: Secondary | ICD-10-CM

## 2014-10-28 DIAGNOSIS — Y998 Other external cause status: Secondary | ICD-10-CM | POA: Diagnosis not present

## 2014-10-28 DIAGNOSIS — W19XXXA Unspecified fall, initial encounter: Secondary | ICD-10-CM

## 2014-10-28 DIAGNOSIS — Z79899 Other long term (current) drug therapy: Secondary | ICD-10-CM | POA: Diagnosis not present

## 2014-10-28 DIAGNOSIS — S52692A Other fracture of lower end of left ulna, initial encounter for closed fracture: Secondary | ICD-10-CM | POA: Insufficient documentation

## 2014-10-28 DIAGNOSIS — Z7952 Long term (current) use of systemic steroids: Secondary | ICD-10-CM | POA: Insufficient documentation

## 2014-10-28 DIAGNOSIS — Z91040 Latex allergy status: Secondary | ICD-10-CM | POA: Insufficient documentation

## 2014-10-28 DIAGNOSIS — Y9289 Other specified places as the place of occurrence of the external cause: Secondary | ICD-10-CM | POA: Diagnosis not present

## 2014-10-28 DIAGNOSIS — W06XXXA Fall from bed, initial encounter: Secondary | ICD-10-CM | POA: Insufficient documentation

## 2014-10-28 DIAGNOSIS — Y9389 Activity, other specified: Secondary | ICD-10-CM | POA: Diagnosis not present

## 2014-10-28 DIAGNOSIS — S59912A Unspecified injury of left forearm, initial encounter: Secondary | ICD-10-CM | POA: Diagnosis present

## 2014-10-28 DIAGNOSIS — Z720 Tobacco use: Secondary | ICD-10-CM | POA: Insufficient documentation

## 2014-10-28 MED ORDER — OXYCODONE-ACETAMINOPHEN 5-325 MG PO TABS: 1.0000 | ORAL_TABLET | Freq: Four times a day (QID) | ORAL | Status: DC | PRN

## 2014-10-28 MED ORDER — OXYCODONE-ACETAMINOPHEN 5-325 MG PO TABS
1.0000 | ORAL_TABLET | Freq: Once | ORAL | Status: AC
  Administered 2014-10-28: 1 via ORAL
  Filled 2014-10-28: qty 1

## 2014-10-28 NOTE — Discharge Instructions (Signed)
Call Dr. Ruthe Mannan office for follow up. Return here as needed.

## 2014-10-28 NOTE — ED Provider Notes (Signed)
CSN: [id number]     Arrival date & time 10/28/14  1608 History   First MD Initiated Contact with Patient 10/28/14 1621     Chief Complaint  Patient presents with  . Arm Pain     (Consider location/radiation/quality/duration/timing/severity/associated sxs/prior Treatment) Patient is a 44 y.o. female presenting with arm injury. The history is provided by the patient.  Arm Injury Location:  Arm and wrist Arm location:  L forearm Wrist location:  L wrist Pain details:    Quality:  Shooting   Severity:  Severe   Onset quality:  Sudden   Duration:  5 hours   Timing:  Constant   Progression:  Worsening Chronicity:  New Handedness:  Right-handed Dislocation: no   Foreign body present:  No foreign bodies Prior injury to area:  No Relieved by:  Nothing Worsened by:  Movement Ineffective treatments:  None tried Associated symptoms: swelling    [name] is a 44 y.o. female who presents to the ED with left forearm and wrist pain that started this morning about 11 o'clock. She states she was in her bed and rolled over and fell off the bed. All her weight went on the left forearm and wrist. Patient drove herself here today. She states her daughter is coming her to the ED and can take her home. Patient requesting pain medication.   Past Medical History  Diagnosis Date  . Pregnancy   . SVD (spontaneous vaginal delivery)     x 1  . Headache(784.0)   . Fibromyalgia   . Arthritis     hands, knees   Past Surgical History  Procedure Laterality Date  . Cesarean section      x 4  . Wisdom tooth extraction    . Right thumb surgery      pins   Family History  Problem Relation Age of Onset  . Diabetes Other   . Heart failure Other   . Hypertension Other   . Cancer Other    History  Substance Use Topics  . Smoking status: Current Some Day Smoker -- 0.50 packs/day for 10 years    Types: Cigarettes  . Smokeless tobacco: Never Used  . Alcohol Use: No   OB History    Gravida  Para Term Preterm AB TAB SAB Ectopic Multiple Living   7 6 6  1  1   6      Review of Systems Negative except as stated in HPI   Allergies  Aspirin; Latex; and Penicillins  Home Medications   Prior to Admission medications   Medication Sig Start Date End Date Taking? Authorizing Provider  albuterol (ACCUNEB) 1.25 MG/3ML nebulizer solution Take 3 mLs (1.25 mg total) by nebulization every 6 (six) hours as needed for wheezing. 09/20/14  Yes Julie Idol, PA-C  SUBOXONE 8-2 MG FILM Place 1 Film under the tongue 3 (three) times daily. 10/21/14  Yes Historical Provider, MD  acyclovir (ZOVIRAX) 400 MG tablet TAKE ONE TABLET BY MOUTH TWICE DAILY FOR 30 DAYS. Patient not taking: Reported on 10/28/2014 07/17/14   Jonnie Kind, MD  benzonatate (TESSALON) 100 MG capsule Take 2 capsules (200 mg total) by mouth 3 (three) times daily as needed for cough. Patient not taking: Reported on 10/28/2014 09/20/14   Evalee Jefferson, PA-C  ciprofloxacin (CIPRO) 500 MG tablet Take 1 tablet (500 mg total) by mouth 2 (two) times daily. Patient not taking: Reported on 10/28/2014 05/16/13   Lenox Ahr, PA-C  doxycycline (VIBRAMYCIN) 100 MG  capsule Take 1 capsule (100 mg total) by mouth 2 (two) times daily. For 10 days Patient not taking: Reported on 10/28/2014 05/10/13   [name], PA-C  guaiFENesin-codeine 100-10 MG/5ML syrup Take 10 mLs by mouth 3 (three) times daily as needed for cough. Patient not taking: Reported on 10/28/2014 10/27/13   [name], PA-C  lansoprazole (PREVACID) 30 MG capsule TAKE ONE CAPSULE BY MOUTH ONCE DAILY. Patient not taking: Reported on 10/28/2014 09/04/14   Estill Dooms, NP  ondansetron (ZOFRAN) 8 MG tablet TAKE 1 TABLET BY MOUTH EVERY 8-12 HOURS AS NEEDED FOR NAUSEA/VOMITING. Patient not taking: Reported on 10/28/2014 05/17/13   Estill Dooms, NP  oxyCODONE-acetaminophen (ROXICET) 5-325 MG per tablet Take 1 tablet by mouth every 6 (six) hours as needed for severe pain.  10/28/14   Jalaila Caradonna Bunnie Pion, NP  permethrin (ELIMITE) 5 % cream Apply head to toe, avoiding eyes and wash off after 10 hours.  May repeat in 10 days if needed Patient not taking: Reported on 10/28/2014 09/20/14   Evalee Jefferson, PA-C  predniSONE (DELTASONE) 10 MG tablet Take 6 tablets day one, 5 tablets day two, 4 tablets day three, 3 tablets day four, 2 tablets day five, then 1 tablet day six Patient not taking: Reported on 10/28/2014 10/27/13   [name], PA-C   Pulse 72  Temp(Src) 99 F (37.2 C) (Oral)  Resp 18  SpO2 98%  LMP 10/28/2014 Physical Exam  Constitutional: She is oriented to person, place, and time. She appears well-developed and well-nourished. No distress.  HENT:  Head: Normocephalic.  Eyes: EOM are normal.  Neck: Neck supple.  Cardiovascular: Normal rate.   Pulmonary/Chest: Effort normal.  Musculoskeletal:       Left forearm: She exhibits tenderness and swelling. She exhibits no deformity and no laceration.  Radial pulses 2+, adequate circulation, good touch sensation. Pain radiates from the forearm to the wrist. Movement of wrist causes pain in the wrist and forearm.   Neurological: She is alert and oriented to person, place, and time. No cranial nerve deficit.  Skin: Skin is warm and dry.  Psychiatric: She has a normal mood and affect. Her behavior is normal.  Nursing note and vitals reviewed.   ED Course  Procedures (including critical care time) Labs Review  MDM  44 y.o. female with pain and swelling to the left forearm and wrist s/p fall earlier today. Ulnar gutter splint applied, ice, elevation, pain management and follow up with ortho. Discussed with the patient and all questioned fully answered. She will return here if any problems arise. Stable for discharge without neurovascular deficits.  Final diagnoses:  Fall  Fracture of left ulna, closed, initial encounter     [facility], NP 10/28/14 [place], MD 10/28/14 [phone]

## 2014-10-28 NOTE — ED Notes (Signed)
Patient c/o arm pain. Per patient fell off high bed. Per patient unable to move arm due to pain. Denies hitting head or LOC. Denies any medications for pain.

## 2014-10-28 NOTE — ED Notes (Signed)
Pt says pharmacies are closed due to weather and road conditions.  Requesting percocet prepack.  Notified Delano Metz NP and medication given.

## 2014-10-28 NOTE — ED Notes (Signed)
Patient with no complaints at this time. Respirations even and unlabored. Skin warm/dry. Discharge instructions reviewed with patient at this time. Patient given opportunity to voice concerns/ask questions. Patient discharged at this time and left Emergency Department with steady gait.   

## 2014-10-31 ENCOUNTER — Encounter: Payer: Self-pay | Admitting: Orthopedic Surgery

## 2014-10-31 ENCOUNTER — Ambulatory Visit (INDEPENDENT_AMBULATORY_CARE_PROVIDER_SITE_OTHER): Payer: Medicare Other | Admitting: Orthopedic Surgery

## 2014-10-31 VITALS — Ht 66.0 in

## 2014-10-31 DIAGNOSIS — S52202A Unspecified fracture of shaft of left ulna, initial encounter for closed fracture: Secondary | ICD-10-CM

## 2014-10-31 MED ORDER — OXYCODONE-ACETAMINOPHEN 5-325 MG PO TABS: 1.0000 | ORAL_TABLET | ORAL | Status: DC | PRN

## 2014-10-31 MED FILL — Oxycodone w/ Acetaminophen Tab 5-325 MG: ORAL | Qty: 6 | Status: AC

## 2014-10-31 NOTE — Progress Notes (Signed)
Patient ID: [name], female   DOB: 12-19-1970, 44 y.o.   MRN: [id number]  Chief Complaint  Patient presents with  . Arm Injury    ER follow up, Left forearm fracture, DOI 10-28-14.     HPI [name] is a 44 y.o. female. HPI  She presents for evaluation of a left forearm fracture of the ulna. She is currently disabled but ambidextrous. Should her right thumb pinning in the past. On this occasion she fell down some steps landed on her forearm complains of severe?? Pain which is at times sharp but usually dull throbbing over the left ulna associated with swelling. She was splinted in the ER. Her x-rays show a midshaft minimally angulated fracture of the ulna   Review of Systems Review of Systems negative   Past Medical History  Diagnosis Date  . Pregnancy   . SVD (spontaneous vaginal delivery)     x 1  . Headache(784.0)   . Fibromyalgia   . Arthritis     hands, knees    Past Surgical History  Procedure Laterality Date  . Cesarean section      x 4  . Wisdom tooth extraction    . Right thumb surgery      pins    Family History  Problem Relation Age of Onset  . Diabetes Other   . Heart failure Other   . Hypertension Other   . Cancer Other     Social History History  Substance Use Topics  . Smoking status: Current Some Day Smoker -- 0.50 packs/day for 10 years    Types: Cigarettes  . Smokeless tobacco: Never Used  . Alcohol Use: No    Allergies  Allergen Reactions  . Aspirin Other (See Comments)    shortness of breath Pt cannot take alleve or ibuprofen  . Latex Swelling    Swelling at site of latex contact  . Penicillins Rash    Current Outpatient Prescriptions  Medication Sig Dispense Refill  . oxyCODONE-acetaminophen (PERCOCET/ROXICET) 5-325 MG per tablet Take 1 tablet by mouth every 4 (four) hours as needed for severe pain. 84 tablet 0  . acyclovir (ZOVIRAX) 400 MG tablet TAKE ONE TABLET BY MOUTH TWICE DAILY FOR 30 DAYS. (Patient not taking:  Reported on 10/28/2014) 60 tablet 11  . albuterol (ACCUNEB) 1.25 MG/3ML nebulizer solution Take 3 mLs (1.25 mg total) by nebulization every 6 (six) hours as needed for wheezing. 75 mL 12  . benzonatate (TESSALON) 100 MG capsule Take 2 capsules (200 mg total) by mouth 3 (three) times daily as needed for cough. (Patient not taking: Reported on 10/28/2014) 30 capsule 0  . ciprofloxacin (CIPRO) 500 MG tablet Take 1 tablet (500 mg total) by mouth 2 (two) times daily. (Patient not taking: Reported on 10/28/2014) 14 tablet 0  . doxycycline (VIBRAMYCIN) 100 MG capsule Take 1 capsule (100 mg total) by mouth 2 (two) times daily. For 10 days (Patient not taking: Reported on 10/28/2014) 20 capsule 0  . guaiFENesin-codeine 100-10 MG/5ML syrup Take 10 mLs by mouth 3 (three) times daily as needed for cough. (Patient not taking: Reported on 10/28/2014) 120 mL 0  . lansoprazole (PREVACID) 30 MG capsule TAKE ONE CAPSULE BY MOUTH ONCE DAILY. (Patient not taking: Reported on 10/28/2014) 30 capsule 0  . ondansetron (ZOFRAN) 8 MG tablet TAKE 1 TABLET BY MOUTH EVERY 8-12 HOURS AS NEEDED FOR NAUSEA/VOMITING. (Patient not taking: Reported on 10/28/2014) 15 tablet 1  . oxyCODONE-acetaminophen (ROXICET) 5-325 MG per tablet Take  1 tablet by mouth every 6 (six) hours as needed for severe pain. 20 tablet 0  . oxyCODONE-acetaminophen (ROXICET) 5-325 MG per tablet Take 1 tablet by mouth every 6 (six) hours as needed for severe pain. 6 tablet 0  . permethrin (ELIMITE) 5 % cream Apply head to toe, avoiding eyes and wash off after 10 hours.  May repeat in 10 days if needed (Patient not taking: Reported on 10/28/2014) 60 g 1  . predniSONE (DELTASONE) 10 MG tablet Take 6 tablets day one, 5 tablets day two, 4 tablets day three, 3 tablets day four, 2 tablets day five, then 1 tablet day six (Patient not taking: Reported on 10/28/2014) 21 tablet 0  . SUBOXONE 8-2 MG FILM Place 1 Film under the tongue 3 (three) times daily.     No current  facility-administered medications for this visit.       Physical Exam Height 5\' 6"  (1.676 m), last menstrual period 10/28/2014. Physical Exam Grooming and hygiene are normal body habitus ectomorphic 3 mood WEEPY  Ambulation not affected  She has tenderness over the fracture site minimal swelling no ecchymosis painful range of motion in pronation supination of the forearm and elbow flexion normal wrist flexion extension and full no instability proximal or distal muscle tone normal skin intact good pulses normal sensation negative lymph nodes   Data Reviewed the fractures in the mid to distal shaft of the ulna appears to be nightstick-like Assessment  Encounter Diagnosis  Name Primary?  Marland Kitchen Ulnar shaft fracture, left, closed, initial encounter Yes           Plan   Recommend cast   return 2 weeks for cast check and refill prescription  Meds ordered this encounter  Medications  . oxyCODONE-acetaminophen (PERCOCET/ROXICET) 5-325 MG per tablet    Sig: Take 1 tablet by mouth every 4 (four) hours as needed for severe pain.    Dispense:  84 tablet    Refill:  0

## 2014-11-01 ENCOUNTER — Telehealth: Payer: Self-pay | Admitting: *Deleted

## 2014-11-01 NOTE — Telephone Encounter (Signed)
Patient is calling asking if Dr Aline Brochure would write something stronger for pain or can she take two at a time  Advised patient that Dr Aline Brochure does not write anything stronger for a fracture and to take medication as prescribed only

## 2014-11-14 ENCOUNTER — Ambulatory Visit (INDEPENDENT_AMBULATORY_CARE_PROVIDER_SITE_OTHER): Payer: Self-pay | Admitting: Orthopedic Surgery

## 2014-11-14 VITALS — BP 141/81 | Ht 66.0 in | Wt 130.0 lb

## 2014-11-14 DIAGNOSIS — S52202D Unspecified fracture of shaft of left ulna, subsequent encounter for closed fracture with routine healing: Secondary | ICD-10-CM

## 2014-11-14 MED ORDER — OXYCODONE-ACETAMINOPHEN 5-325 MG PO TABS: 1.0000 | ORAL_TABLET | ORAL | Status: DC | PRN

## 2014-11-14 NOTE — Progress Notes (Signed)
Chief Complaint  Patient presents with  . Follow-up    2 week recheck on left forearm fracture and check cast. DOI 10-28-14.   Encounter Diagnosis  Name Primary?  Marland Kitchen Ulnar shaft fracture, left, closed, with routine healing, subsequent encounter Yes    BP 141/81 mmHg  Ht 5\' 6"  (1.676 m)  Wt 130 lb (58.968 kg)  BMI 20.99 kg/m2  LMP 10/28/2014  Two-week follow-up left forearm fracture treated with cast  Patient came in to refill medications and check cast  Cast intact medications refilled  Follow-up 2 weeks x-ray cast change and medication refill.

## 2014-11-27 ENCOUNTER — Telehealth: Payer: Self-pay | Admitting: Orthopedic Surgery

## 2014-11-27 ENCOUNTER — Other Ambulatory Visit: Payer: Self-pay | Admitting: *Deleted

## 2014-11-27 ENCOUNTER — Encounter: Payer: Self-pay | Admitting: Orthopedic Surgery

## 2014-11-27 MED ORDER — OXYCODONE-ACETAMINOPHEN 5-325 MG PO TABS: 1.0000 | ORAL_TABLET | ORAL | Status: DC | PRN

## 2014-11-27 NOTE — Telephone Encounter (Signed)
Prescription available for pick up, called patient, no answer 

## 2014-11-27 NOTE — Telephone Encounter (Signed)
Patient is calling asking for a refill on pain medication oxyCODONE-acetaminophen (PERCOCET/ROXICET) 5-325 MG per tablet pleae advise

## 2014-11-27 NOTE — Telephone Encounter (Signed)
Patient has picked up Rx.

## 2014-11-30 ENCOUNTER — Encounter: Payer: Self-pay | Admitting: Orthopedic Surgery

## 2014-11-30 ENCOUNTER — Ambulatory Visit (INDEPENDENT_AMBULATORY_CARE_PROVIDER_SITE_OTHER): Payer: Medicare Other

## 2014-11-30 ENCOUNTER — Ambulatory Visit (INDEPENDENT_AMBULATORY_CARE_PROVIDER_SITE_OTHER): Payer: Medicare Other | Admitting: Orthopedic Surgery

## 2014-11-30 DIAGNOSIS — S5292XD Unspecified fracture of left forearm, subsequent encounter for closed fracture with routine healing: Secondary | ICD-10-CM

## 2014-11-30 NOTE — Progress Notes (Signed)
Left nightstick fracture doi 10/28/2014  xrays OOP   Today's x-ray shows slightly angulated nightstick fracture with callus forming around the fracture. On the lateral x-ray the supine bones aren't completely anatomic alignment  The patient did scratch under her cast and Cast wet but no skin breaks  Short arm cast applied  X-ray out of plaster 4 weeks

## 2014-12-09 ENCOUNTER — Other Ambulatory Visit: Payer: Self-pay | Admitting: Adult Health

## 2014-12-11 ENCOUNTER — Other Ambulatory Visit: Payer: Self-pay | Admitting: *Deleted

## 2014-12-11 ENCOUNTER — Telehealth: Payer: Self-pay | Admitting: Orthopedic Surgery

## 2014-12-11 MED ORDER — OXYCODONE-ACETAMINOPHEN 5-325 MG PO TABS: 1.0000 | ORAL_TABLET | ORAL | Status: DC | PRN

## 2014-12-11 NOTE — Telephone Encounter (Signed)
Patient picked up Rx

## 2014-12-11 NOTE — Telephone Encounter (Signed)
Prescription available, patient aware  

## 2014-12-11 NOTE — Telephone Encounter (Signed)
Patient is calling requesting a medication refill onoxyCODONE-acetaminophen (PERCOCET/ROXICET) 5-325 MG per tablet and is stating that she needs this by lunch time due to having to go out of town today, please advise?

## 2014-12-22 ENCOUNTER — Telehealth: Payer: Self-pay | Admitting: Orthopedic Surgery

## 2014-12-22 NOTE — Telephone Encounter (Signed)
Patient called to request refill, pain medication: oxyCODONE-acetaminophen (PERCOCET/ROXICET) 5-325 MG per tablet [[id number]]   - relayed to patient that medication refill requests addressed on next clinic day, which is Monday, 12/25/14.  Patient's phone# is [phone].  Her next scheduled appointment is 12/28/14.

## 2014-12-25 ENCOUNTER — Other Ambulatory Visit: Payer: Self-pay | Admitting: *Deleted

## 2014-12-25 MED ORDER — OXYCODONE-ACETAMINOPHEN 5-325 MG PO TABS: 1.0000 | ORAL_TABLET | ORAL | Status: DC | PRN

## 2014-12-25 NOTE — Telephone Encounter (Signed)
Prescription available, called patient, no answer 

## 2014-12-26 NOTE — Telephone Encounter (Signed)
Patient picked up prescription.

## 2014-12-28 ENCOUNTER — Ambulatory Visit: Payer: Medicare Other | Admitting: Orthopedic Surgery

## 2015-01-08 ENCOUNTER — Other Ambulatory Visit: Payer: Self-pay | Admitting: *Deleted

## 2015-01-08 ENCOUNTER — Ambulatory Visit (INDEPENDENT_AMBULATORY_CARE_PROVIDER_SITE_OTHER): Payer: Medicare Other | Admitting: Obstetrics and Gynecology

## 2015-01-08 ENCOUNTER — Encounter: Payer: Self-pay | Admitting: Obstetrics and Gynecology

## 2015-01-08 ENCOUNTER — Telehealth: Payer: Self-pay | Admitting: Orthopedic Surgery

## 2015-01-08 VITALS — BP 140/90 | Ht 66.0 in | Wt 158.0 lb

## 2015-01-08 DIAGNOSIS — N941 Dyspareunia: Secondary | ICD-10-CM | POA: Diagnosis not present

## 2015-01-08 DIAGNOSIS — IMO0002 Reserved for concepts with insufficient information to code with codable children: Secondary | ICD-10-CM

## 2015-01-08 DIAGNOSIS — N946 Dysmenorrhea, unspecified: Secondary | ICD-10-CM | POA: Diagnosis not present

## 2015-01-08 DIAGNOSIS — N854 Malposition of uterus: Secondary | ICD-10-CM | POA: Diagnosis not present

## 2015-01-08 MED ORDER — OXYCODONE-ACETAMINOPHEN 5-325 MG PO TABS: 1.0000 | ORAL_TABLET | ORAL | Status: DC | PRN

## 2015-01-08 NOTE — Progress Notes (Signed)
Patient ID: [name], female   DOB: [birth year], 44 y.o.   MRN: [id number] Pt here today for cramping and severe abdominal pain. Pt states that she has had the pain and cramping for about a week and a half. Pt states that activity causes the pain. Pt states that she has used a heating pad but it doesn't seem to help. Pt states that she has had irregular periods for the past 2 months, nauseated.

## 2015-01-08 NOTE — Progress Notes (Signed)
Patient ID: [name], female   DOB: Dec 21, 1970, 44 y.o.   MRN: [id number]    [facility] Visit  Patient name: [name] MRN [id number]  Date of birth: [birth year]  CC & HPI:  [name] is a 44 y.o. female s/p Cesarean section presenting today for intermittent, cramping abdominal pain that started 1.5 weeks ago with menses. Pt reports dyspareunia as an associated symptom. She states pain also becomes worse with activity. Onset of pain occurred 1 day after the start of her last menses and continued over the last week. She notes menses was lighter than normal and lasted for 2 days. Pt has history of 5 Cesarean sections. She smokes cigarettes. Pt denies continued vaginal bleeding, fever and chills as associated symptoms.   ROS:  A complete 10 system review of systems was obtained and all systems are negative except as noted in the HPI and PMH.   Pertinent History Reviewed:   Reviewed: Significant for Cesarean section Medical         Past Medical History  Diagnosis Date  . Pregnancy   . SVD (spontaneous vaginal delivery)     x 1  . Headache(784.0)   . Fibromyalgia   . Arthritis     hands, knees                              Surgical Hx:    Past Surgical History  Procedure Laterality Date  . Cesarean section      x 4  . Wisdom tooth extraction    . Right thumb surgery      pins   Medications: Reviewed & Updated - see associated section                       Current outpatient prescriptions:  .  acyclovir (ZOVIRAX) 400 MG tablet, TAKE ONE TABLET BY MOUTH TWICE DAILY FOR 30 DAYS., Disp: 60 tablet, Rfl: 11 .  albuterol (ACCUNEB) 1.25 MG/3ML nebulizer solution, Take 3 mLs (1.25 mg total) by nebulization every 6 (six) hours as needed for wheezing., Disp: 75 mL, Rfl: 12 .  lansoprazole (PREVACID) 30 MG capsule, TAKE ONE CAPSULE BY MOUTH ONCE DAILY., Disp: 30 capsule, Rfl: 3 .  oxyCODONE-acetaminophen (PERCOCET/ROXICET) 5-325 MG per tablet, Take 1 tablet by mouth every  4 (four) hours as needed for severe pain., Disp: 84 tablet, Rfl: 0   Social History: Reviewed -  reports that she has been smoking Cigarettes.  She has a 5 pack-year smoking history. She has never used smokeless tobacco.  Objective Findings:  Vitals: Blood pressure 140/90, height 5\' 6"  (1.676 m), weight 158 lb (71.668 kg), last menstrual period 11/09/2014.  Physical Examination: General appearance - alert, well appearing, and in no distress and oriented to person, place, and time Mental status - alert, oriented to person, place, and time, normal mood, behavior, speech, dress, motor activity, and thought processes Pelvic - normal external genitalia, vulva, vagina, cervix, uterus and adnexa VULVA: normal appearing vulva with no masses, tenderness or lesions VAGINA: normal appearing vagina with normal color and discharge, no lesions; tender to introitus CERVIX: normal appearing cervix without discharge or lesions  UTERUS: uterus is normal size, shape, consistency; tenderness with palpation and movement; uterus tilts to the back ADNEXA: normal adnexa in size, nontender and no masses Bladder-nontender  Assessment & Plan:   A:  1. TTP over uterus; worse with movement  2. Dyspareunia (introital and with penetration) 3. Irregular and light menses 4. Discussion of treatment plans which includes possible supracervical hysterectomy, IUD and minipill  P:  1. Pregnancy test to r/o ectopic pregnancy pt to complete 2. Self and partner exam to determine source of pain 3. Follow-up in 3 weeks for re-exam, and decide on tx plan:   Consider Progesterone only pill as trial, discuss surgical options further if desired.  Pt not a good endo ablation candidate due to pain c/o and pt low pain threshold.  Options would include supracervical hyst vs TAH.     This chart was scribed for Jonnie Kind, MD by Tula Nakayama, ED Scribe. This patient was seen in room 1 and the patient's care was started at 11:09  AM.   I personally performed the services described in this documentation, which was SCRIBED in my presence. The recorded information has been reviewed and considered accurate. It has been edited as necessary during review. Jonnie Kind, MD

## 2015-01-08 NOTE — Telephone Encounter (Signed)
Prescription available, called patient, not available  Please remove [phone] from her chart, not her number

## 2015-01-08 NOTE — Telephone Encounter (Signed)
Patient is calling requesting a refill on pain medication oxyCODONE-acetaminophen (PERCOCET/ROXICET) 5-325 MG per tablet please advise?

## 2015-01-08 NOTE — Patient Instructions (Signed)
Do self exam, have partner do exam.

## 2015-01-09 NOTE — Telephone Encounter (Signed)
Patient Picked up Rx 

## 2015-01-09 NOTE — Telephone Encounter (Signed)
Phone Number has been updated in the computer

## 2015-01-10 ENCOUNTER — Ambulatory Visit (INDEPENDENT_AMBULATORY_CARE_PROVIDER_SITE_OTHER): Payer: Self-pay | Admitting: Orthopedic Surgery

## 2015-01-10 ENCOUNTER — Ambulatory Visit (INDEPENDENT_AMBULATORY_CARE_PROVIDER_SITE_OTHER): Payer: Medicare Other

## 2015-01-10 VITALS — BP 123/85 | Ht 66.0 in | Wt 158.0 lb

## 2015-01-10 DIAGNOSIS — S5292XD Unspecified fracture of left forearm, subsequent encounter for closed fracture with routine healing: Secondary | ICD-10-CM | POA: Diagnosis not present

## 2015-01-10 NOTE — Patient Instructions (Signed)
Brace at all times except for bathing  Hand exercises

## 2015-01-10 NOTE — Progress Notes (Signed)
Chief Complaint  Patient presents with  . Follow-up    4 week recheck left forearm + xray cast off, DOI 10/28/14    Cast off x-rays show excellent callus forming around the ulnar fracture with near-anatomic alignment. The patient complains of severe pain which is not explained. Passive range of motion in her hand and wrist are normal elbow passive range of motion also normal.  Recommend short arm splint, hand exercises, prescription was written 2 days ago she can continue that medication and an x-ray in 4 weeks.

## 2015-01-19 ENCOUNTER — Telehealth: Payer: Self-pay | Admitting: Orthopedic Surgery

## 2015-01-19 NOTE — Telephone Encounter (Signed)
12:20p.m-Patient called to request refill on pain medication:  Oxycodone-acetaminophen(Percocet/Roxicet) 5-325mg  - I relayed Dr Aline Brochure is out of office until 01/29/15; patient inquired about other options, asked if other physician covering; I relayed Dr. Luna Glasgow is in [place], for emergencies, and that it is at his discretion and availability for appointment, and also that he does not generally prescribe.  Patient's phone is [phone].

## 2015-01-25 ENCOUNTER — Other Ambulatory Visit: Payer: Self-pay | Admitting: *Deleted

## 2015-01-25 MED ORDER — HYDROCODONE-ACETAMINOPHEN 10-325 MG PO TABS: 1.0000 | ORAL_TABLET | ORAL | Status: DC | PRN

## 2015-01-29 ENCOUNTER — Encounter: Payer: Self-pay | Admitting: Obstetrics and Gynecology

## 2015-01-29 ENCOUNTER — Ambulatory Visit (INDEPENDENT_AMBULATORY_CARE_PROVIDER_SITE_OTHER): Payer: Medicare Other | Admitting: Obstetrics and Gynecology

## 2015-01-29 VITALS — BP 120/80 | Ht 66.0 in | Wt 159.0 lb

## 2015-01-29 DIAGNOSIS — N941 Dyspareunia: Secondary | ICD-10-CM

## 2015-01-29 DIAGNOSIS — D239 Other benign neoplasm of skin, unspecified: Secondary | ICD-10-CM

## 2015-01-29 DIAGNOSIS — D219 Benign neoplasm of connective and other soft tissue, unspecified: Secondary | ICD-10-CM

## 2015-01-29 DIAGNOSIS — IMO0002 Reserved for concepts with insufficient information to code with codable children: Secondary | ICD-10-CM

## 2015-01-29 NOTE — Telephone Encounter (Signed)
Patient picked up Rx

## 2015-01-29 NOTE — Progress Notes (Signed)
Patient ID: [name], female   DOB: March 29, 1971, 44 y.o.   MRN: [id number] Pt here today for follow up. Pt states that the pain has gotten better. Pt states that she has found 2 places on her left breast in between her breasts.    [facility] Visit  Patient name: [name] MRN [id number]  Date of birth: [birth year]  CC & HPI:  [name] is a 44 y.o. female presenting today for followup for pelvic pain, dyspareunia Also has 2 small areas of skin firmness over the sternum, with a freckle over the larger, which is 8-9 mm transverse, x 4 mm thick. The upper area is 4 mm, and is skin fibrosis only.PT tans. Dyspareunia is better, pt and husb both aware of cervix, and now are changing positions ROS:  1.  Pertinent History Reviewed:   Reviewed: Significant for  Medical         Past Medical History  Diagnosis Date  . Pregnancy   . SVD (spontaneous vaginal delivery)     x 1  . Headache(784.0)   . Fibromyalgia   . Arthritis     hands, knees                              Surgical Hx:    Past Surgical History  Procedure Laterality Date  . Cesarean section      x 4  . Wisdom tooth extraction    . Right thumb surgery      pins   Medications: Reviewed & Updated - see associated section                       Current outpatient prescriptions:  .  acyclovir (ZOVIRAX) 400 MG tablet, TAKE ONE TABLET BY MOUTH TWICE DAILY FOR 30 DAYS., Disp: 60 tablet, Rfl: 11 .  albuterol (ACCUNEB) 1.25 MG/3ML nebulizer solution, Take 3 mLs (1.25 mg total) by nebulization every 6 (six) hours as needed for wheezing., Disp: 75 mL, Rfl: 12 .  HYDROcodone-acetaminophen (NORCO) 10-325 MG per tablet, Take 1 tablet by mouth every 4 (four) hours as needed., Disp: 84 tablet, Rfl: 0 .  lansoprazole (PREVACID) 30 MG capsule, TAKE ONE CAPSULE BY MOUTH ONCE DAILY., Disp: 30 capsule, Rfl: 3   Social History: Reviewed -  reports that she has been smoking Cigarettes.  She has a 5 pack-year smoking history.  She has never used smokeless tobacco.  Objective Findings:  Vitals: Blood pressure 120/80, height 5\' 6"  (1.676 m), weight 159 lb (72.122 kg), last menstrual period 01/23/2015.  Physical Examination: General appearance - alert, well appearing, and in no distress, oriented to person, place, and time and anxious Mental status - alert, oriented to person, place, and time, normal mood, behavior, speech, dress, motor activity, and thought processes Abdomen - soft, nontender, nondistended, no masses or organomegaly Breasts - breasts appear normal, no suspicious masses, no skin or nipple changes or axillary nodes Pelvic - normal external genitalia, vulva, vagina, cervix, uterus and adnexa, CERVIX: normal appearing cervix without discharge or lesions, mildy sensitive to touch , UTERUS: retroverted, uterus is normal size shape, consistency and nontender Skin - normal coloration and turgor, no rashes, no suspicious skin lesions noted LESIONS NOTED: normal complete skin exam, no suspicious lesions, 2 fibrous areas in skin over sternum, one under a freckle, both nontender, not in breast tissue.  Assessment & Plan:   A:  1. Improved dyspareunia 2.  Skin fibromas of skin over sternum, not breast tissue  P:  1. Follow prn

## 2015-01-29 NOTE — Telephone Encounter (Signed)
Prescription available, patient aware   Patient is stating that she is [place] hurting on top of hand and up arm to elbow, wants to know if she should come in sooner than her 1 month follow up

## 2015-02-02 ENCOUNTER — Encounter (HOSPITAL_COMMUNITY): Payer: Self-pay | Admitting: *Deleted

## 2015-02-02 ENCOUNTER — Emergency Department (HOSPITAL_COMMUNITY)
Admission: EM | Admit: 2015-02-02 | Discharge: 2015-02-02 | Disposition: A | Discharge status: Home / Self Care | Payer: Medicare Other | Attending: Emergency Medicine | Admitting: Emergency Medicine

## 2015-02-02 DIAGNOSIS — Z91040 Latex allergy status: Secondary | ICD-10-CM | POA: Diagnosis not present

## 2015-02-02 DIAGNOSIS — M1990 Unspecified osteoarthritis, unspecified site: Secondary | ICD-10-CM | POA: Insufficient documentation

## 2015-02-02 DIAGNOSIS — J0191 Acute recurrent sinusitis, unspecified: Secondary | ICD-10-CM | POA: Diagnosis not present

## 2015-02-02 DIAGNOSIS — Z880 Allergy status to penicillin: Secondary | ICD-10-CM | POA: Diagnosis not present

## 2015-02-02 DIAGNOSIS — Z79899 Other long term (current) drug therapy: Secondary | ICD-10-CM | POA: Diagnosis not present

## 2015-02-02 DIAGNOSIS — R0981 Nasal congestion: Secondary | ICD-10-CM | POA: Diagnosis present

## 2015-02-02 MED ORDER — AZITHROMYCIN 250 MG PO TABS: ORAL_TABLET | ORAL | Status: DC

## 2015-02-02 MED ORDER — BENZONATATE 100 MG PO CAPS: 200.0000 mg | ORAL_CAPSULE | Freq: Three times a day (TID) | ORAL | Status: DC | PRN

## 2015-02-02 NOTE — Discharge Instructions (Signed)

## 2015-02-02 NOTE — ED Notes (Signed)
Pt states she has head and chest congestion x7 days, condition not improving.

## 2015-02-02 NOTE — ED Provider Notes (Signed)
CSN: [id number]     Arrival date & time 02/02/15  1622 History   First MD Initiated Contact with Patient 02/02/15 1654     Chief Complaint  Patient presents with  . Nasal Congestion     (Consider location/radiation/quality/duration/timing/severity/associated sxs/prior Treatment) The history is provided by the patient.   [name] is a 44 y.o. female presenting with a one-week history of nasal congestion which has been yellow to green with episodes of bloody streaking along with facial pain, cough which has been nonproductive, mild sore throat and subjective fevers.  She has been using Mucinex DM and has been increasing her fluid intake.  In addition has been using warm compresses to her face, Tylenol without relief of symptoms.  She is a smoker but denies smoking this week due to her symptoms.  She has a prior history of sinus infections and current symptoms are similar.  She denies chest pain or shortness of breath, dizziness, ear pain.     Past Medical History  Diagnosis Date  . Pregnancy   . SVD (spontaneous vaginal delivery)     x 1  . Headache(784.0)   . Fibromyalgia   . Arthritis     hands, knees   Past Surgical History  Procedure Laterality Date  . Cesarean section      x 4  . Wisdom tooth extraction    . Right thumb surgery      pins   Family History  Problem Relation Age of Onset  . Diabetes Other   . Heart failure Other   . Hypertension Other   . Cancer Other   . Hypertension Mother   . Diabetes Mother   . Cancer Mother     lung  . CAD Father   . Heart failure Father   . Heart disease Father   . Seizures Son    History  Substance Use Topics  . Smoking status: Current Some Day Smoker -- 0.50 packs/day for 10 years    Types: Cigarettes  . Smokeless tobacco: Never Used  . Alcohol Use: No   OB History    Gravida Para Term Preterm AB TAB SAB Ectopic Multiple Living   7 6 6  1  1   6      Review of Systems  Constitutional: Positive for fever and  chills.  HENT: Positive for congestion, rhinorrhea, sinus pressure and sore throat. Negative for dental problem, ear pain, facial swelling, sneezing, tinnitus, trouble swallowing and voice change.   Eyes: Negative for discharge.  Respiratory: Positive for cough. Negative for shortness of breath, wheezing and stridor.   Cardiovascular: Negative for chest pain.  Gastrointestinal: Negative for abdominal pain.  Genitourinary: Negative.       Allergies  Aspirin; Latex; Phenergan; and Penicillins  Home Medications   Prior to Admission medications   Medication Sig Start Date End Date Taking? Authorizing Provider  acyclovir (ZOVIRAX) 400 MG tablet TAKE ONE TABLET BY MOUTH TWICE DAILY FOR 30 DAYS. 07/17/14   Jonnie Kind, MD  albuterol (ACCUNEB) 1.25 MG/3ML nebulizer solution Take 3 mLs (1.25 mg total) by nebulization every 6 (six) hours as needed for wheezing. 09/20/14   Evalee Jefferson, PA-C  azithromycin (ZITHROMAX) 250 MG tablet Take 2 tablets by mouth on day one followed by one tablet daily for 4 days. 02/02/15   Evalee Jefferson, PA-C  benzonatate (TESSALON) 100 MG capsule Take 2 capsules (200 mg total) by mouth 3 (three) times daily as needed. 02/02/15   Evalee Jefferson,  PA-C  HYDROcodone-acetaminophen (NORCO) 10-325 MG per tablet Take 1 tablet by mouth every 4 (four) hours as needed. 01/25/15   Carole Civil, MD  lansoprazole (PREVACID) 30 MG capsule TAKE ONE CAPSULE BY MOUTH ONCE DAILY. 12/11/14   Estill Dooms, NP   BP 134/77 mmHg  Pulse 78  Temp(Src) 98.5 F (36.9 C) (Oral)  Resp 17  Ht 5\' 6"  (1.676 m)  Wt 145 lb (65.772 kg)  BMI 23.41 kg/m2  SpO2 98%  LMP 01/23/2015 Physical Exam  Constitutional: She is oriented to person, place, and time. She appears well-developed and well-nourished.  HENT:  Head: Normocephalic and atraumatic.  Right Ear: Tympanic membrane and ear canal normal.  Left Ear: Tympanic membrane and ear canal normal.  Nose: Mucosal edema and rhinorrhea present.   Mouth/Throat: Uvula is midline, oropharynx is clear and moist and mucous membranes are normal. No oropharyngeal exudate, posterior oropharyngeal edema, posterior oropharyngeal erythema or tonsillar abscesses.  Eyes: Conjunctivae are normal.  Cardiovascular: Normal rate and normal heart sounds.   Pulmonary/Chest: Effort normal. No respiratory distress. She has no wheezes. She has no rales.  Abdominal: Soft. There is no tenderness.  Musculoskeletal: Normal range of motion.  Neurological: She is alert and oriented to person, place, and time.  Skin: Skin is warm and dry. No rash noted.  Psychiatric: She has a normal mood and affect.    ED Course  Procedures (including critical care time) Labs Review Labs Reviewed - No data to display  Imaging Review No results found.   EKG Interpretation None      MDM   Final diagnoses:  Acute recurrent sinusitis, unspecified location    Patient was encouraged to continue using her Mucinex DM.  She was prescribed Zithromax for suspected sinusitis as she is pen allergic and Tessalon Perles for the cough.  Suggested menthol cough drops, Vicks vapor stick, moist steam to help with nasal congestion.  When necessary follow-up with PCP if symptoms do not improve with treatment.  The patient appears reasonably screened and/or stabilized for discharge and I doubt any other medical condition or other [facility] requiring further screening, evaluation, or treatment in the ED at this time prior to discharge.     Evalee Jefferson, PA-C 02/02/15 [place], DO 02/04/15 1850

## 2015-02-07 ENCOUNTER — Encounter (HOSPITAL_COMMUNITY): Payer: Self-pay | Admitting: Emergency Medicine

## 2015-02-07 ENCOUNTER — Emergency Department (HOSPITAL_COMMUNITY)
Admission: EM | Admit: 2015-02-07 | Discharge: 2015-02-07 | Disposition: A | Discharge status: Home / Self Care | Payer: Medicare Other | Attending: Emergency Medicine | Admitting: Emergency Medicine

## 2015-02-07 DIAGNOSIS — Z720 Tobacco use: Secondary | ICD-10-CM | POA: Diagnosis not present

## 2015-02-07 DIAGNOSIS — Z792 Long term (current) use of antibiotics: Secondary | ICD-10-CM | POA: Diagnosis not present

## 2015-02-07 DIAGNOSIS — R52 Pain, unspecified: Secondary | ICD-10-CM | POA: Diagnosis present

## 2015-02-07 DIAGNOSIS — J069 Acute upper respiratory infection, unspecified: Secondary | ICD-10-CM

## 2015-02-07 DIAGNOSIS — Z91040 Latex allergy status: Secondary | ICD-10-CM | POA: Diagnosis not present

## 2015-02-07 DIAGNOSIS — Z880 Allergy status to penicillin: Secondary | ICD-10-CM | POA: Insufficient documentation

## 2015-02-07 DIAGNOSIS — Z79899 Other long term (current) drug therapy: Secondary | ICD-10-CM | POA: Insufficient documentation

## 2015-02-07 DIAGNOSIS — M159 Polyosteoarthritis, unspecified: Secondary | ICD-10-CM | POA: Diagnosis not present

## 2015-02-07 DIAGNOSIS — R000 Tachycardia, unspecified: Secondary | ICD-10-CM | POA: Insufficient documentation

## 2015-02-07 DIAGNOSIS — J329 Chronic sinusitis, unspecified: Secondary | ICD-10-CM

## 2015-02-07 DIAGNOSIS — J328 Other chronic sinusitis: Secondary | ICD-10-CM | POA: Insufficient documentation

## 2015-02-07 MED ORDER — PREDNISONE 10 MG PO TABS
60.0000 mg | ORAL_TABLET | Freq: Once | ORAL | Status: AC
  Administered 2015-02-07: 60 mg via ORAL
  Filled 2015-02-07 (×2): qty 1

## 2015-02-07 MED ORDER — HYDROCOD POLST-CPM POLST ER 10-8 MG/5ML PO SUER
5.0000 mL | Freq: Once | ORAL | Status: AC
  Administered 2015-02-07: 5 mL via ORAL
  Filled 2015-02-07: qty 5

## 2015-02-07 MED ORDER — HYDROCODONE-HOMATROPINE 5-1.5 MG/5ML PO SYRP: 5.0000 mL | ORAL_SOLUTION | Freq: Four times a day (QID) | ORAL | Status: DC | PRN

## 2015-02-07 MED ORDER — LORATADINE-PSEUDOEPHEDRINE ER 5-120 MG PO TB12: 1.0000 | ORAL_TABLET | Freq: Two times a day (BID) | ORAL | Status: DC

## 2015-02-07 MED ORDER — PREDNISONE 10 MG PO TABS: ORAL_TABLET | ORAL | Status: DC

## 2015-02-07 NOTE — ED Notes (Signed)
PA at bedside.

## 2015-02-07 NOTE — ED Notes (Signed)
Pt reports seen for same x5 days ago. Pt reports continued generalized body aches,n/v,chills. Pt reports given a px of abx but reports no improvement.

## 2015-02-07 NOTE — Discharge Instructions (Signed)
Sinusitis Sinusitis is redness, soreness, and puffiness (inflammation) of the air pockets in the bones of your face (sinuses). The redness, soreness, and puffiness can cause air and mucus to get trapped in your sinuses. This can allow germs to grow and cause an infection.  HOME CARE   Drink enough fluids to keep your pee (urine) clear or pale yellow.  Use a humidifier in your home.  Run a hot shower to create steam in the bathroom. Sit in the bathroom with the door closed. Breathe in the steam 3-4 times a day.  Put a warm, moist washcloth on your face 3-4 times a day, or as told by your doctor.  Use salt water sprays (saline sprays) to wet the thick fluid in your nose. This can help the sinuses drain.  Only take medicine as told by your doctor. GET HELP RIGHT AWAY IF:   Your pain gets worse.  You have very bad headaches.  You are sick to your stomach (nauseous).  You throw up (vomit).  You are very sleepy (drowsy) all the time.  Your face is puffy (swollen).  Your vision changes.  You have a stiff neck.  You have trouble breathing. MAKE SURE YOU:   Understand these instructions.  Will watch your condition.  Will get help right away if you are not doing well or get worse. Document Released: 03/10/2008 Document Revised: 06/16/2012 Document Reviewed: 04/27/2012 [facility] Patient Information 2015 [place], [place]. This information is not intended to replace advice given to you by your health care provider. Make sure you discuss any questions you have with your health care provider.  Upper Respiratory Infection, Adult An upper respiratory infection (URI) is also known as the common cold. It is often caused by a type of germ (virus). Colds are easily spread (contagious). You can pass it to others by kissing, coughing, sneezing, or drinking out of the same glass. Usually, you get better in 1 or 2 weeks.  HOME CARE   Only take medicine as told by your doctor.  Use a warm mist  humidifier or breathe in steam from a hot shower.  Drink enough water and fluids to keep your pee (urine) clear or pale yellow.  Get plenty of rest.  Return to work when your temperature is back to normal or as told by your doctor. You may use a face mask and wash your hands to stop your cold from spreading. GET HELP RIGHT AWAY IF:   After the first few days, you feel you are getting worse.  You have questions about your medicine.  You have chills, shortness of breath, or brown or red spit (mucus).  You have yellow or brown snot (nasal discharge) or pain in the face, especially when you bend forward.  You have a fever, puffy (swollen) neck, pain when you swallow, or white spots in the back of your throat.  You have a bad headache, ear pain, sinus pain, or chest pain.  You have a high-pitched whistling sound when you breathe in and out (wheezing).  You have a lasting cough or cough up blood.  You have sore muscles or a stiff neck. MAKE SURE YOU:   Understand these instructions.  Will watch your condition.  Will get help right away if you are not doing well or get worse. Document Released: 03/10/2008 Document Revised: 12/15/2011 Document Reviewed: 12/28/2013 [facility] Patient Information 2015 [place], [place]. This information is not intended to replace advice given to you by your health care provider. Make  sure you discuss any questions you have with your health care provider.

## 2015-02-07 NOTE — ED Provider Notes (Signed)
CSN: [id number]     Arrival date & time 02/07/15  7322 History   First MD Initiated Contact with Patient 02/07/15 1843     Chief Complaint  Patient presents with  . Generalized Body Aches     (Consider location/radiation/quality/duration/timing/severity/associated sxs/prior Treatment) Patient is a 44 y.o. female presenting with URI. The history is provided by the patient.  URI Presenting symptoms: congestion, cough and rhinorrhea   Presenting symptoms comment:  Body aches Severity:  Moderate Onset quality:  Gradual Duration:  12 days Timing:  Intermittent Progression:  Worsening Chronicity:  New Relieved by:  Nothing Worsened by:  Nothing tried Associated symptoms: arthralgias, headaches and myalgias   Associated symptoms comment:  Cough Risk factors: sick contacts   Risk factors: no diabetes mellitus, no immunosuppression and no recent travel     Past Medical History  Diagnosis Date  . Pregnancy   . SVD (spontaneous vaginal delivery)     x 1  . Headache(784.0)   . Fibromyalgia   . Arthritis     hands, knees   Past Surgical History  Procedure Laterality Date  . Cesarean section      x 4  . Wisdom tooth extraction    . Right thumb surgery      pins   Family History  Problem Relation Age of Onset  . Diabetes Other   . Heart failure Other   . Hypertension Other   . Cancer Other   . Hypertension Mother   . Diabetes Mother   . Cancer Mother     lung  . CAD Father   . Heart failure Father   . Heart disease Father   . Seizures Son    History  Substance Use Topics  . Smoking status: Current Some Day Smoker -- 0.50 packs/day for 10 years    Types: Cigarettes  . Smokeless tobacco: Never Used  . Alcohol Use: No   OB History    Gravida Para Term Preterm AB TAB SAB Ectopic Multiple Living   7 6 6  1  1   6      Review of Systems  HENT: Positive for congestion, rhinorrhea and sinus pressure.   Respiratory: Positive for cough.   Musculoskeletal: Positive for  myalgias and arthralgias.  Neurological: Positive for headaches.  All other systems reviewed and are negative.     Allergies  Aspirin; Latex; Phenergan; and Penicillins  Home Medications   Prior to Admission medications   Medication Sig Start Date End Date Taking? Authorizing Provider  acyclovir (ZOVIRAX) 400 MG tablet TAKE ONE TABLET BY MOUTH TWICE DAILY FOR 30 DAYS. 07/17/14   Jonnie Kind, MD  albuterol (ACCUNEB) 1.25 MG/3ML nebulizer solution Take 3 mLs (1.25 mg total) by nebulization every 6 (six) hours as needed for wheezing. 09/20/14   Evalee Jefferson, PA-C  azithromycin (ZITHROMAX) 250 MG tablet Take 2 tablets by mouth on day one followed by one tablet daily for 4 days. 02/02/15   Evalee Jefferson, PA-C  benzonatate (TESSALON) 100 MG capsule Take 2 capsules (200 mg total) by mouth 3 (three) times daily as needed. 02/02/15   Evalee Jefferson, PA-C  HYDROcodone-acetaminophen (NORCO) 10-325 MG per tablet Take 1 tablet by mouth every 4 (four) hours as needed. 01/25/15   Carole Civil, MD  lansoprazole (PREVACID) 30 MG capsule TAKE ONE CAPSULE BY MOUTH ONCE DAILY. 12/11/14   Estill Dooms, NP   BP 156/92 mmHg  Pulse 109  Temp(Src) 97.8 F (36.6 C) (Oral)  Resp  17  Ht 5\' 6"  (1.676 m)  Wt 130 lb (58.968 kg)  BMI 20.99 kg/m2  SpO2 99%  LMP 01/23/2015 Physical Exam  Constitutional: She is oriented to person, place, and time. She appears well-developed and well-nourished.  Non-toxic appearance.  HENT:  Head: Normocephalic.  Right Ear: Tympanic membrane, external ear and ear canal normal.  Left Ear: Tympanic membrane, external ear and ear canal normal.  Nasal congestion present.  Eyes: EOM and lids are normal. Pupils are equal, round, and reactive to light.  Neck: Normal range of motion. Neck supple. Carotid bruit is not present.  Cardiovascular: Regular rhythm, normal heart sounds, intact distal pulses and normal pulses.  Tachycardia present.  Exam reveals no friction rub.   No murmur  heard. Pulmonary/Chest: Breath sounds normal. No respiratory distress.  Abdominal: Soft. Bowel sounds are normal. There is no tenderness. There is no guarding.  Musculoskeletal: Normal range of motion.  Lymphadenopathy:       Head (right side): No submandibular adenopathy present.       Head (left side): No submandibular adenopathy present.    She has no cervical adenopathy.  Neurological: She is alert and oriented to person, place, and time. She has normal strength. No cranial nerve deficit or sensory deficit.  Skin: Skin is warm and dry. No rash noted.  Psychiatric: She has a normal mood and affect. Her speech is normal.  Nursing note and vitals reviewed.   ED Course  Procedures (including critical care time) Labs Review Labs Reviewed - No data to display  Imaging Review No results found.   EKG Interpretation None      MDM  Vital signs are within normal limits with exception of the heart rate being elevated at 109.  Pulse oximetry is 99% on room air. Within normal limits by my interpretation. The plan at this time is for the patient to be treated with prednisone taper, Hycodan cough syrup, and Claritin-D for congestion. I have asked the patient to increase fluids. Patient is to follow with her primary physician or return to the emergency department if not improving.    Final diagnoses:  None    **I have reviewed nursing notes, vital signs, and all appropriate lab and imaging results for this patient.Lily Kocher, PA-C 02/07/15 1923  Virgel Manifold, MD 02/07/15 [phone]

## 2015-02-08 ENCOUNTER — Encounter: Payer: Self-pay | Admitting: Orthopedic Surgery

## 2015-02-08 ENCOUNTER — Ambulatory Visit (INDEPENDENT_AMBULATORY_CARE_PROVIDER_SITE_OTHER): Payer: Medicare Other | Admitting: Orthopedic Surgery

## 2015-02-08 ENCOUNTER — Ambulatory Visit (INDEPENDENT_AMBULATORY_CARE_PROVIDER_SITE_OTHER): Payer: Medicare Other

## 2015-02-08 VITALS — BP 145/82 | Ht 66.0 in | Wt 130.0 lb

## 2015-02-08 DIAGNOSIS — S5292XD Unspecified fracture of left forearm, subsequent encounter for closed fracture with routine healing: Secondary | ICD-10-CM

## 2015-02-08 MED ORDER — HYDROCODONE-ACETAMINOPHEN 7.5-325 MG PO TABS: 1.0000 | ORAL_TABLET | Freq: Four times a day (QID) | ORAL | Status: DC | PRN

## 2015-02-08 NOTE — Progress Notes (Signed)
Chief Complaint  Patient presents with  . Follow-up    1 month follow up + xray Left forearm fx, DOI 10/28/14    Encounter Diagnosis  Name Primary?  . Forearm fracture, left, closed, with routine healing, subsequent encounter Yes    Patient comes in almost 3 months status post left forearm fracture of her ulna. She Giddings complains of severe pain we've been weaning her pain medication starting with oxycodone were now down to Norco 10 mg we will wean down further today. She says she has some pain with wrist extension wrist flexion. I gave her exercises to do to really gain hand motion strength and dexterity and she's been somewhat compliant  X-rays today show bridging callus across of right near anatomic alignment less than 15 of angulation on any x-ray in fact a lateral x-ray shows complete anatomic alignment  BP 145/82 mmHg  Ht 5\' 6"  (1.676 m)  Wt 130 lb (58.968 kg)  BMI 20.99 kg/m2  LMP 01/23/2015 She is awake alert and oriented 3. Her clinical alignment again is excellent. No motor atrophy. Normal sensation. Clinically she has some loss of rotation, elbow flexion extension normal. Wrist flexion extension painful but normal. Neurovascular exam normal.  I've advised her to work more diligently on her range of motion exercises  She will continue those and see me in a month we'll repeat the x-ray 1 more time as the fracture line is Reaser visible. We should note: Clinically her arm looks very good.

## 2015-02-21 ENCOUNTER — Telehealth: Payer: Self-pay | Admitting: Orthopedic Surgery

## 2015-02-21 ENCOUNTER — Ambulatory Visit (INDEPENDENT_AMBULATORY_CARE_PROVIDER_SITE_OTHER): Payer: Medicaid Other | Admitting: Psychiatry

## 2015-02-21 ENCOUNTER — Encounter (HOSPITAL_COMMUNITY): Payer: Self-pay | Admitting: Psychiatry

## 2015-02-21 VITALS — BP 151/89 | HR 78 | Ht 66.0 in | Wt 164.2 lb

## 2015-02-21 DIAGNOSIS — F4323 Adjustment disorder with mixed anxiety and depressed mood: Secondary | ICD-10-CM | POA: Diagnosis not present

## 2015-02-21 MED ORDER — MIRTAZAPINE 15 MG PO TABS: 15.0000 mg | ORAL_TABLET | Freq: Every day | ORAL | Status: DC

## 2015-02-21 NOTE — Telephone Encounter (Signed)
Patient is asking to be referred to physical therapy to see if that will build her arm back up, she is [name] having a lot of pain in her wrist, and she is also asking for a med refill on HYDROcodone-acetaminophen (NORCO) 7.5-325 MG per tablet, please advise?
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
## 2015-02-21 NOTE — Telephone Encounter (Signed)
REFER TO PT?  HYDROCODONE 7.5MG  OR STEP DOWN?

## 2015-02-22 ENCOUNTER — Other Ambulatory Visit: Payer: Self-pay | Admitting: Orthopedic Surgery

## 2015-02-22 ENCOUNTER — Telehealth (HOSPITAL_COMMUNITY): Payer: Self-pay | Admitting: *Deleted

## 2015-02-22 DIAGNOSIS — S52202S Unspecified fracture of shaft of left ulna, sequela: Secondary | ICD-10-CM

## 2015-02-22 MED ORDER — IBUPROFEN 800 MG PO TABS: 800.0000 mg | ORAL_TABLET | Freq: Three times a day (TID) | ORAL | Status: DC | PRN

## 2015-02-22 MED ORDER — HYDROCODONE-ACETAMINOPHEN 5-325 MG PO TABS
1.0000 | ORAL_TABLET | Freq: Three times a day (TID) | ORAL | Status: DC | PRN
Start: 2015-02-22 — End: 2015-03-29

## 2015-02-22 MED ORDER — GABAPENTIN 100 MG PO CAPS: 100.0000 mg | ORAL_CAPSULE | Freq: Three times a day (TID) | ORAL | Status: DC

## 2015-02-22 NOTE — Telephone Encounter (Signed)
Call [name] tell her to call OT at [facility] outpatient to make appt for therapy   i wrote the orders and she has prescriptions to pick up

## 2015-02-22 NOTE — Telephone Encounter (Signed)
Patient picked up Rx and she has the number to [facility]

## 2015-02-22 NOTE — Telephone Encounter (Signed)
Discussed concerns regarding pt's substance use

## 2015-02-22 NOTE — Telephone Encounter (Signed)
[name], [name], FROM [facility] RETURNED YOUR PHONE FROM YESTERDAY REGARDING [name].

## 2015-02-23 ENCOUNTER — Ambulatory Visit (HOSPITAL_COMMUNITY): Payer: Medicare Other | Admitting: Physical Therapy

## 2015-03-02 ENCOUNTER — Ambulatory Visit (HOSPITAL_COMMUNITY): Payer: Medicare Other | Attending: Orthopedic Surgery | Admitting: Occupational Therapy

## 2015-03-08 ENCOUNTER — Encounter (HOSPITAL_COMMUNITY): Payer: Self-pay | Admitting: Occupational Therapy

## 2015-03-08 ENCOUNTER — Telehealth (HOSPITAL_COMMUNITY): Payer: Self-pay | Admitting: Occupational Therapy

## 2015-03-08 ENCOUNTER — Ambulatory Visit (HOSPITAL_COMMUNITY): Payer: Medicare Other | Attending: Orthopedic Surgery | Admitting: Occupational Therapy

## 2015-03-08 DIAGNOSIS — M6289 Other specified disorders of muscle: Secondary | ICD-10-CM

## 2015-03-08 DIAGNOSIS — S52202D Unspecified fracture of shaft of left ulna, subsequent encounter for closed fracture with routine healing: Secondary | ICD-10-CM

## 2015-03-08 DIAGNOSIS — M25532 Pain in left wrist: Secondary | ICD-10-CM

## 2015-03-08 DIAGNOSIS — M629 Disorder of muscle, unspecified: Secondary | ICD-10-CM

## 2015-03-08 DIAGNOSIS — M79632 Pain in left forearm: Secondary | ICD-10-CM

## 2015-03-08 DIAGNOSIS — M6281 Muscle weakness (generalized): Secondary | ICD-10-CM

## 2015-03-08 DIAGNOSIS — M2560 Stiffness of unspecified joint, not elsewhere classified: Secondary | ICD-10-CM
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
## 2015-03-08 NOTE — Telephone Encounter (Signed)
Patient called at 10:01 said she was headed our way and would be here in 15 mins.

## 2015-03-09 ENCOUNTER — Ambulatory Visit (HOSPITAL_COMMUNITY): Payer: Medicare Other

## 2015-03-13 ENCOUNTER — Ambulatory Visit (INDEPENDENT_AMBULATORY_CARE_PROVIDER_SITE_OTHER): Payer: Medicare Other | Admitting: Orthopedic Surgery

## 2015-03-13 ENCOUNTER — Ambulatory Visit (HOSPITAL_COMMUNITY): Payer: Medicare Other

## 2015-03-13 ENCOUNTER — Ambulatory Visit (INDEPENDENT_AMBULATORY_CARE_PROVIDER_SITE_OTHER): Payer: Medicare Other

## 2015-03-13 ENCOUNTER — Encounter: Payer: Self-pay | Admitting: Orthopedic Surgery

## 2015-03-13 ENCOUNTER — Encounter (HOSPITAL_COMMUNITY): Payer: Self-pay

## 2015-03-13 VITALS — BP 107/67 | Ht 66.0 in | Wt 130.0 lb

## 2015-03-13 DIAGNOSIS — M629 Disorder of muscle, unspecified: Secondary | ICD-10-CM

## 2015-03-13 DIAGNOSIS — M6289 Other specified disorders of muscle: Secondary | ICD-10-CM

## 2015-03-13 DIAGNOSIS — S5292XD Unspecified fracture of left forearm, subsequent encounter for closed fracture with routine healing: Secondary | ICD-10-CM

## 2015-03-13 DIAGNOSIS — M25532 Pain in left wrist: Secondary | ICD-10-CM

## 2015-03-13 DIAGNOSIS — M6281 Muscle weakness (generalized): Secondary | ICD-10-CM

## 2015-03-13 DIAGNOSIS — M25632 Stiffness of left wrist, not elsewhere classified: Secondary | ICD-10-CM

## 2015-03-13 DIAGNOSIS — S52202D Unspecified fracture of shaft of left ulna, subsequent encounter for closed fracture with routine healing: Secondary | ICD-10-CM | POA: Diagnosis not present

## 2015-03-13 MED ORDER — IBUPROFEN 800 MG PO TABS: 800.0000 mg | ORAL_TABLET | Freq: Three times a day (TID) | ORAL | Status: DC | PRN

## 2015-03-13 MED ORDER — GABAPENTIN 100 MG PO CAPS: 100.0000 mg | ORAL_CAPSULE | Freq: Three times a day (TID) | ORAL | Status: DC

## 2015-03-13 NOTE — Patient Instructions (Signed)
Please continue your physical therapy appointments and once they are done please call the office for a final follow-up visit

## 2015-03-13 NOTE — Therapy (Signed)
[place], [place], [address] Phone: [phone]   Fax:  [phone]  Occupational Therapy Treatment  Patient Details  Name: [name] MRN: [id number] Date of Birth: 1971-07-29 Referring Provider:  Carole Civil, MD  Encounter Date: 03/13/2015      OT End of Session - 03/13/15 0913    Visit Number 2   Number of Visits 16   Date for OT Re-Evaluation 05/07/15  Mini-reassess on 04/05/2015   Authorization Type Medicare Part A & B    Authorization Time Period Before 10th visit   Authorization - Visit Number 2   Authorization - Number of Visits 10   OT Start Time [phone]   OT Stop Time 0845   OT Time Calculation (min) 21 min   Activity Tolerance Patient tolerated treatment well   Behavior During Therapy [facility] for tasks assessed/performed      Past Medical History  Diagnosis Date  . Pregnancy   . SVD (spontaneous vaginal delivery)     x 1  . Headache(784.0)   . Fibromyalgia   . Arthritis     hands, knees    Past Surgical History  Procedure Laterality Date  . Cesarean section      x 4  . Wisdom tooth extraction    . Right thumb surgery      pins    There were no vitals filed for this visit.  Visit Diagnosis:  Tight fascia  Pain in left wrist  Muscle weakness (generalized)  Stiffness of wrist joint, left      Subjective Assessment - 03/13/15 0838    Subjective  S: It feels like it's broke. It hurts with or without that brace on.    Currently in Pain? Yes   Pain Score 7    Pain Location Wrist   Pain Orientation Left   Pain Descriptors / Indicators Constant   Pain Type Chronic pain            [facility] OT Assessment - 03/13/15 0841    Assessment   Diagnosis s/p left ulnar fx   Precautions   Precautions None   Coordination   9 Hole Peg Test Right;Left   Right 9 Hole Peg Test 16.7"   Left 9 Hole Peg Test 32.0"                  OT Treatments/Exercises (OP) - 03/13/15  0842    Exercises   Exercises Elbow;Wrist;Hand   Elbow Exercises   Forearm Supination PROM;AROM;10 reps   Forearm Pronation PROM;AROM;10 reps   Wrist Flexion PROM;AROM;10 reps   Wrist Extension PROM;AROM;10 reps   Additional Elbow Exercises   Hand Gripper with Large Beads 6/6 beads with gripper set at 7#   Hand Gripper with Medium Beads all medium beads with gripper set at 7#   Wrist Exercises   Wrist Radial Deviation AROM;PROM;10 reps   Wrist Ulnar Deviation PROM;AROM;10 reps   Additional Wrist Exercises   Sponges 13,16                  OT Short Term Goals - 03/13/15 6415    OT SHORT TERM GOAL #1   Title Pt will be educated on HEP.    Status On-going   OT SHORT TERM GOAL #2   Title Pt will decrease pain to 3/10 during daily activities.    Status On-going   OT SHORT TERM GOAL #3   Title Pt will decrease  fascial restrictions from mod amount to min amounts in left forearm.    Status On-going   OT SHORT TERM GOAL #4   Title Pt will increase wrist AROM by 30 degrees to increase ability to use LUE as dominant.    Status On-going   OT SHORT TERM GOAL #5   Title Pt will increase wrist and elbow strength to 3/5 to increase ability to perform grooming tasks.    Status On-going   OT SHORT TERM GOAL #6   Title Pt will increase grip strength by 15# and pinch strength by 5# to increase ability to hold onto grooming tools used to fix her hair.    Status On-going           OT Long Term Goals - 03/13/15 2202    OT LONG TERM GOAL #1   Title Pt will return to highest level of functioning and independence in daily tasks.    Time --   Period --   Status On-going   OT LONG TERM GOAL #2   Title Pt will decrease fascial restrictions from min to trace amounts or less.    Time --   Period --   Status On-going   OT LONG TERM GOAL #3   Title Pt will increase AROM of wrist & elbow to WNL to increase ability to perform childcare tasks using LUE as dominant.    Time --   Period  --   Status On-going   OT LONG TERM GOAL #4   Title Pt will increase strength of wrist and elbow to 4/5 to increase ability to steer her jet ski.    Time --   Period Weeks   Status On-going   OT LONG TERM GOAL #5   Title Pt will increase grip strength by 30# and pinch strength by 10# to increase ability to hold 44 year old.    Time --   Period --   Status On-going   Long Term Additional Goals   Additional Long Term Goals Yes   OT LONG TERM GOAL #6   Title Patient will increase Left hand coordination by completing 9 hole peg test in 20" or less.    Time 8   Period Weeks   Status New               Plan - 03/13/15 0913    Clinical Impression Statement A: Pt arrived late to appt this AM. Pt completed 9 hole peg test, grip strengthening activity with handgripper and AROM and PROM exercises.Pt continuously complained of being unable to use Left hand during daily tasks due to the pain. Educated patient about muscle atrophy while  wearing the cast and the length of time it takes to increase strength.    Plan P: Add myofascial release for pain manamgement. Complete small beads with hangripper set at 7#, pinch strengthening activity. Give patient print out of evaluation.         Problem List Patient Active Problem List   Diagnosis Date Noted  . Adjustment disorder with mixed anxiety and depressed mood 02/21/2015  . Dysmenorrhea 01/08/2015  . Dyspareunia 01/08/2015  . Uterus retroversion 01/08/2015  . Sterilization 06/25/2012    Ailene Ravel, OTR/L,CBIS  [phone]  03/13/2015, 9:31 AM  [place] [address] [place], [place], [address] Phone: [phone]   Fax:  [phone]

## 2015-03-13 NOTE — Progress Notes (Signed)
Patient ID: [name], female   DOB: July 24, 1971, 44 y.o.   MRN: [id number] HPI  Chief Complaint  Patient presents with  . Follow-up    1 month follow up + xray left forearm fx, DOI 10/28/14   The patient is status post left forearm fracture at the ulna treated with cast first long-arm and short arm and she came in for another x-ray today and we started her with some occupational therapy due to decreased pronation.  Complains of wrist joint pain and ulnar carpal pain no pain or tenderness at the site  X-rays were done today and there is complete bridging across the fracture at this point she has a degrees of measured angulation in the AP plane none on the lateral x-ray    ROS: She has some small finger pain which I believe is ulnar nerve related.   VS BP 107/67 mmHg  Ht 5\' 6"  (1.676 m)  Wt 130 lb (58.968 kg)  BMI 20.99 kg/m2   GEN she has normal appearance well-groomed MENTAL STATUS she is awake alert and oriented MOOD pleasant GAIT normal noncontributory   SKIN left forearm skin is normal  INSPECTION there is no tenderness at the fracture site but she has tenderness in the ulnar/carpal joint at the TFCC  ROM flexion extension at the elbow is normal supination is approximate 45 pronation is only 20  MOTOR GRADE normal flexion-extension and normal wrist flexion extension strength  STABILTY TESTS noncontributory  Epitrochlear lymph nodes remain normal  The color of her hand is normal the capillary refill is normal the temperature is normal  Other than the small finger pain she has no sensory deficit in the hand   IMAGING STUDIES AP and lateral forearm x-ray ordered, read by me as complete healing of the fracture with 8 angulation on the AP x-ray  DX  Encounter Diagnosis  Name Primary?  . Forearm fracture, left, closed, with routine healing, subsequent encounter Yes     Plan continue occupational therapy, continue ibuprofen, continue gabapentin, continue Norco  5 mg. She did pass to go back to 7.5 mg but I will keep her on the 5  Follow-up after her last occupational therapy visit for her final checkup

## 2015-03-15 ENCOUNTER — Ambulatory Visit (HOSPITAL_COMMUNITY): Payer: Medicare Other | Admitting: Occupational Therapy

## 2015-03-15 ENCOUNTER — Encounter (HOSPITAL_COMMUNITY): Payer: Self-pay | Admitting: Occupational Therapy

## 2015-03-15 DIAGNOSIS — M25532 Pain in left wrist: Secondary | ICD-10-CM

## 2015-03-15 DIAGNOSIS — M6281 Muscle weakness (generalized): Secondary | ICD-10-CM

## 2015-03-15 DIAGNOSIS — M79632 Pain in left forearm: Secondary | ICD-10-CM

## 2015-03-15 DIAGNOSIS — M629 Disorder of muscle, unspecified: Secondary | ICD-10-CM

## 2015-03-15 DIAGNOSIS — M6289 Other specified disorders of muscle: Secondary | ICD-10-CM

## 2015-03-15 DIAGNOSIS — M2560 Stiffness of unspecified joint, not elsewhere classified: Secondary | ICD-10-CM

## 2015-03-15 DIAGNOSIS — M25632 Stiffness of left wrist, not elsewhere classified: Secondary | ICD-10-CM

## 2015-03-15 DIAGNOSIS — S52202D Unspecified fracture of shaft of left ulna, subsequent encounter for closed fracture with routine healing: Secondary | ICD-10-CM | POA: Diagnosis not present

## 2015-03-15 NOTE — Therapy (Signed)
[place], [place], [address] Phone: [phone]   Fax:  [phone]  Occupational Therapy Treatment  Patient Details  Name: [name] MRN: [id number] Date of Birth: [birth year] Referring Provider:  Carole Civil, MD  Encounter Date: 03/15/2015      OT End of Session - 03/15/15 1030    Visit Number 3   Number of Visits 16   Date for OT Re-Evaluation 05/07/15  Mini-reassess on 04/05/2015   Authorization Type Medicare Part A & B    Authorization Time Period Before 10th visit   Authorization - Visit Number 3   Authorization - Number of Visits 10   OT Start Time [phone]   OT Stop Time 1025   OT Time Calculation (min) 41 min   Activity Tolerance Patient tolerated treatment well   Behavior During Therapy [facility] for tasks assessed/performed      Past Medical History  Diagnosis Date  . Pregnancy   . SVD (spontaneous vaginal delivery)     x 1  . Headache(784.0)   . Fibromyalgia   . Arthritis     hands, knees    Past Surgical History  Procedure Laterality Date  . Cesarean section      x 4  . Wisdom tooth extraction    . Right thumb surgery      pins    There were no vitals filed for this visit.  Visit Diagnosis:  Pain in left wrist  Tight fascia  Muscle weakness (generalized)  Stiffness of wrist joint, left  Pain in left forearm  Decreased range of motion      Subjective Assessment - 03/15/15 0944    Subjective  S: This wrap makes it feel better. It always hurts with that brace.    Currently in Pain? Yes   Pain Score 7    Pain Location Wrist   Pain Orientation Left   Pain Descriptors / Indicators Constant   Pain Type Chronic pain            [facility] OT Assessment - 03/15/15 1029    Assessment   Diagnosis s/p left ulnar fx   Precautions   Precautions None                  OT Treatments/Exercises (OP) - 03/15/15 1006    Exercises   Exercises Elbow;Wrist;Hand   Elbow Exercises   Forearm Supination PROM;AROM;10 reps   Forearm Pronation PROM;AROM;10 reps   Wrist Flexion PROM;AROM;10 reps   Wrist Extension PROM;AROM;10 reps   Additional Elbow Exercises   Hand Gripper with Large Beads 6/6 beads with gripper set at 11#    Hand Gripper with Medium Beads 13/13 beads at 7#    Hand Gripper with Small Beads 17/17 beads at 7#  increased time   Wrist Exercises   Wrist Radial Deviation AROM;PROM;10 reps   Wrist Ulnar Deviation PROM;AROM;10 reps   Manual Therapy   Manual Therapy Myofascial release   Myofascial Release Myofascial release to dorsal wrist and forearm regions to decrease fascial restrictions and increase pain free joint mobility                  OT Short Term Goals - 03/13/15 4010    OT SHORT TERM GOAL #1   Title Pt will be educated on HEP.    Status On-going   OT SHORT TERM GOAL #2   Title Pt will decrease pain to 3/10 during daily activities.  Status On-going   OT SHORT TERM GOAL #3   Title Pt will decrease fascial restrictions from mod amount to min amounts in left forearm.    Status On-going   OT SHORT TERM GOAL #4   Title Pt will increase wrist AROM by 30 degrees to increase ability to use LUE as dominant.    Status On-going   OT SHORT TERM GOAL #5   Title Pt will increase wrist and elbow strength to 3/5 to increase ability to perform grooming tasks.    Status On-going   OT SHORT TERM GOAL #6   Title Pt will increase grip strength by 15# and pinch strength by 5# to increase ability to hold onto grooming tools used to fix her hair.    Status On-going           OT Long Term Goals - 03/13/15 1610    OT LONG TERM GOAL #1   Title Pt will return to highest level of functioning and independence in daily tasks.    Time --   Period --   Status On-going   OT LONG TERM GOAL #2   Title Pt will decrease fascial restrictions from min to trace amounts or less.    Time --   Period --   Status On-going   OT LONG TERM  GOAL #3   Title Pt will increase AROM of wrist & elbow to WNL to increase ability to perform childcare tasks using LUE as dominant.    Time --   Period --   Status On-going   OT LONG TERM GOAL #4   Title Pt will increase strength of wrist and elbow to 4/5 to increase ability to steer her jet ski.    Time --   Period Weeks   Status On-going   OT LONG TERM GOAL #5   Title Pt will increase grip strength by 30# and pinch strength by 10# to increase ability to hold 44 year old.    Time --   Period --   Status On-going   Long Term Additional Goals   Additional Long Term Goals Yes   OT LONG TERM GOAL #6   Title Patient will increase Left hand coordination by completing 9 hole peg test in 20" or less.    Time 8   Period Weeks   Status New               Plan - 03/15/15 1030    Clinical Impression Statement A: Added myofascial release, completed small & medium beads with hand gripper at 7#, large beads with hand gripper at 11#. Pt continues to complain of pain at home and during AROM exercises, good range during PROM with no complaints of pain. Provided pt with copy of evaluation, educated on importance of completing HEP. Pinch activity not completed due to time constraint.    Plan P: Increase hand gripper to 18# for large/medium beads, attempt small at 18#, 11# if unable. Pinch strengthening activity.         Problem List Patient Active Problem List   Diagnosis Date Noted  . Adjustment disorder with mixed anxiety and depressed mood 02/21/2015  . Dysmenorrhea 01/08/2015  . Dyspareunia 01/08/2015  . Uterus retroversion 01/08/2015  . Sterilization 06/25/2012    Guadelupe Sabin, OTR/L  [phone]  03/15/2015, 10:34 AM  [place], [place], [address] Phone: [phone]   Fax:  [phone]

## 2015-03-19 ENCOUNTER — Ambulatory Visit (HOSPITAL_COMMUNITY): Payer: Medicare Other

## 2015-03-19 ENCOUNTER — Encounter (HOSPITAL_COMMUNITY): Payer: Self-pay

## 2015-03-19 ENCOUNTER — Other Ambulatory Visit: Payer: Self-pay | Admitting: Adult Health

## 2015-03-19 DIAGNOSIS — M79632 Pain in left forearm: Secondary | ICD-10-CM

## 2015-03-19 DIAGNOSIS — S52202D Unspecified fracture of shaft of left ulna, subsequent encounter for closed fracture with routine healing: Secondary | ICD-10-CM | POA: Diagnosis not present

## 2015-03-19 DIAGNOSIS — M6289 Other specified disorders of muscle: Secondary | ICD-10-CM

## 2015-03-19 DIAGNOSIS — M25532 Pain in left wrist: Secondary | ICD-10-CM

## 2015-03-19 DIAGNOSIS — M6281 Muscle weakness (generalized): Secondary | ICD-10-CM

## 2015-03-19 DIAGNOSIS — M25632 Stiffness of left wrist, not elsewhere classified: Secondary | ICD-10-CM

## 2015-03-19 DIAGNOSIS — M629 Disorder of muscle, unspecified: Secondary | ICD-10-CM

## 2015-03-19 MED ORDER — OMEPRAZOLE 40 MG PO CPDR: 40.0000 mg | DELAYED_RELEASE_CAPSULE | Freq: Every day | ORAL | Status: DC

## 2015-03-19 NOTE — Therapy (Signed)
[place], [place], [address] Phone: [phone]   Fax:  [phone]  Occupational Therapy Treatment  Patient Details  Name: [name] MRN: [id number] Date of Birth: 1971/08/15 Referring Provider:  Carole Civil, MD  Encounter Date: 03/19/2015      OT End of Session - 03/19/15 0908    Visit Number 4   Number of Visits 16   Date for OT Re-Evaluation 05/07/15  Mini-reassess on 04/05/2015   Authorization Type Medicare Part A & B    Authorization Time Period Before 10th visit   Authorization - Visit Number 4   Authorization - Number of Visits 10   OT Start Time 0815   OT Stop Time 0855   OT Time Calculation (min) 40 min   Activity Tolerance Patient tolerated treatment well   Behavior During Therapy [facility] for tasks assessed/performed      Past Medical History  Diagnosis Date  . Pregnancy   . SVD (spontaneous vaginal delivery)     x 1  . Headache(784.0)   . Fibromyalgia   . Arthritis     hands, knees    Past Surgical History  Procedure Laterality Date  . Cesarean section      x 4  . Wisdom tooth extraction    . Right thumb surgery      pins    There were no vitals filed for this visit.  Visit Diagnosis:  Pain in left wrist  Tight fascia  Muscle weakness (generalized)  Stiffness of wrist joint, left  Pain in left forearm      Subjective Assessment - 03/19/15 0836    Subjective  S: The brace just makes my wrist really stiff.    Currently in Pain? Yes   Pain Score 7    Pain Location Wrist   Pain Orientation Left   Pain Descriptors / Indicators Constant   Pain Type Chronic pain            [facility] OT Assessment - 03/19/15 0837    Assessment   Diagnosis s/p left ulnar fx   Precautions   Precautions None                  OT Treatments/Exercises (OP) - 03/19/15 0837    Exercises   Exercises Elbow;Wrist;Hand   Elbow Exercises   Forearm Supination PROM;AROM;10  reps   Forearm Pronation PROM;AROM;10 reps   Wrist Flexion PROM;AROM;10 reps   Wrist Extension PROM;AROM;10 reps   Additional Elbow Exercises   Hand Gripper with Large Beads 6/6 beads with gripper set at 18#    Hand Gripper with Medium Beads 13/13 beads at 18#    Hand Gripper with Small Beads 17/17 beads at 18#   Wrist Exercises   Wrist Radial Deviation AROM;PROM;10 reps   Wrist Ulnar Deviation PROM;AROM;10 reps   Manual Therapy   Manual Therapy Myofascial release   Myofascial Release Myofascial release to dorsal wrist and forearm regions to decrease fascial restrictions and increase pain free joint mobility                OT Education - 03/19/15 0908    Education provided Yes   Education Details Bio freeze use for pain management and ace bandage compression wrap for pain and swelling reduction.    Person(s) Educated Patient   Methods Explanation;Demonstration   Comprehension Verbalized understanding          OT Short Term Goals - 03/13/15 [phone]  OT SHORT TERM GOAL #1   Title Pt will be educated on HEP.    Status On-going   OT SHORT TERM GOAL #2   Title Pt will decrease pain to 3/10 during daily activities.    Status On-going   OT SHORT TERM GOAL #3   Title Pt will decrease fascial restrictions from mod amount to min amounts in left forearm.    Status On-going   OT SHORT TERM GOAL #4   Title Pt will increase wrist AROM by 30 degrees to increase ability to use LUE as dominant.    Status On-going   OT SHORT TERM GOAL #5   Title Pt will increase wrist and elbow strength to 3/5 to increase ability to perform grooming tasks.    Status On-going   OT SHORT TERM GOAL #6   Title Pt will increase grip strength by 15# and pinch strength by 5# to increase ability to hold onto grooming tools used to fix her hair.    Status On-going           OT Long Term Goals - 03/19/15 0912    OT LONG TERM GOAL #1   Title Pt will return to highest level of functioning and  independence in daily tasks.    Status On-going   OT LONG TERM GOAL #2   Title Pt will decrease fascial restrictions from min to trace amounts or less.    Status On-going   OT LONG TERM GOAL #3   Title Pt will increase AROM of wrist & elbow to WNL to increase ability to perform childcare tasks using LUE as dominant.    Status On-going   OT LONG TERM GOAL #4   Title Pt will increase strength of wrist and elbow to 4/5 to increase ability to steer her jet ski.    Period Weeks   Status On-going   OT LONG TERM GOAL #5   Title Pt will increase grip strength by 30# and pinch strength by 10# to increase ability to hold 44 year old.    Status On-going   OT LONG TERM GOAL #6   Title Patient will increase Left hand coordination by completing 9 hole peg test in 20" or less.    Status On-going               Plan - 03/19/15 0909    Clinical Impression Statement A: Pt arrived late to appointment carrying medium size [employer] in left hand without difficulty. Pt continues to report increased pain and swelling in ulnar wrist region. Myofascial release completed and patient educated on use of Biofreeze for pain and ace bandage compression wrap to decrease swelling. Pt completed handgripper activity with gripper set at 18#. Pt reports pain with all activities. Pinch strengthening task not completed due to time constraint.    Plan P: Pinch strengthening activity with red putty. Provide patient with red putty for HEP.         Problem List Patient Active Problem List   Diagnosis Date Noted  . Adjustment disorder with mixed anxiety and depressed mood 02/21/2015  . Dysmenorrhea 01/08/2015  . Dyspareunia 01/08/2015  . Uterus retroversion 01/08/2015  . Sterilization 06/25/2012    Ailene Ravel, OTR/L,CBIS  [phone]  03/19/2015, 9:15 AM  [place], [place], [address] Phone: [phone]   Fax:  [phone]

## 2015-03-21 ENCOUNTER — Ambulatory Visit (HOSPITAL_COMMUNITY): Payer: Self-pay | Admitting: Psychiatry

## 2015-03-22 ENCOUNTER — Encounter (HOSPITAL_COMMUNITY): Payer: Self-pay

## 2015-03-22 ENCOUNTER — Encounter (HOSPITAL_COMMUNITY): Payer: Self-pay | Admitting: Occupational Therapy

## 2015-03-22 ENCOUNTER — Ambulatory Visit (HOSPITAL_COMMUNITY): Payer: Medicare Other | Admitting: Occupational Therapy

## 2015-03-22 DIAGNOSIS — M6289 Other specified disorders of muscle: Secondary | ICD-10-CM

## 2015-03-22 DIAGNOSIS — M629 Disorder of muscle, unspecified: Secondary | ICD-10-CM

## 2015-03-22 DIAGNOSIS — M2560 Stiffness of unspecified joint, not elsewhere classified: Secondary | ICD-10-CM

## 2015-03-22 DIAGNOSIS — S52202D Unspecified fracture of shaft of left ulna, subsequent encounter for closed fracture with routine healing: Secondary | ICD-10-CM | POA: Diagnosis not present

## 2015-03-22 DIAGNOSIS — M79632 Pain in left forearm: Secondary | ICD-10-CM

## 2015-03-22 DIAGNOSIS — M25532 Pain in left wrist: Secondary | ICD-10-CM

## 2015-03-22 DIAGNOSIS — M6281 Muscle weakness (generalized): Secondary | ICD-10-CM

## 2015-03-22 DIAGNOSIS — M25632 Stiffness of left wrist, not elsewhere classified: Secondary | ICD-10-CM

## 2015-03-22 NOTE — Patient Instructions (Signed)
Home Exercises Program Theraputty Exercises  Do the following exercises 2 times a day using your affected hand.  1. Roll putty into a ball.  2. Make into a pancake.  3. Roll putty into a roll.  4. Pinch along log with first finger and thumb.   5. Make into a ball.  6. Roll it back into a log.   7. Pinch using thumb and side of first finger.  8. Roll into a ball, then flatten into a pancake.  9. Using your fingers, make putty into a mountain.   
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
## 2015-03-26 ENCOUNTER — Encounter (HOSPITAL_COMMUNITY): Payer: Self-pay | Admitting: Specialist

## 2015-03-26 ENCOUNTER — Ambulatory Visit (HOSPITAL_COMMUNITY): Payer: Medicare Other | Admitting: Specialist

## 2015-03-26 DIAGNOSIS — S52202D Unspecified fracture of shaft of left ulna, subsequent encounter for closed fracture with routine healing: Secondary | ICD-10-CM | POA: Diagnosis not present

## 2015-03-26 DIAGNOSIS — M6281 Muscle weakness (generalized): Secondary | ICD-10-CM

## 2015-03-26 DIAGNOSIS — M25532 Pain in left wrist: Secondary | ICD-10-CM

## 2015-03-26 DIAGNOSIS — M6289 Other specified disorders of muscle: Secondary | ICD-10-CM

## 2015-03-26 DIAGNOSIS — M629 Disorder of muscle, unspecified: Secondary | ICD-10-CM
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
## 2015-03-28 ENCOUNTER — Telehealth: Payer: Self-pay | Admitting: Orthopedic Surgery

## 2015-03-28 ENCOUNTER — Ambulatory Visit (HOSPITAL_COMMUNITY): Payer: Medicare Other | Admitting: Specialist

## 2015-03-28 NOTE — Telephone Encounter (Signed)
Patient called to request refill on medication HYDROcodone-acetaminophen (NORCO/VICODIN) 5-325 MG per tablet [[id number]]  - her ph#'s are [phone], or alternate # [phone].

## 2015-03-29 ENCOUNTER — Encounter (HOSPITAL_COMMUNITY): Payer: Self-pay | Admitting: Occupational Therapy

## 2015-03-29 ENCOUNTER — Other Ambulatory Visit: Payer: Self-pay | Admitting: *Deleted

## 2015-03-29 ENCOUNTER — Telehealth: Payer: Self-pay | Admitting: Orthopedic Surgery

## 2015-03-29 MED ORDER — HYDROCODONE-ACETAMINOPHEN 5-325 MG PO TABS: 1.0000 | ORAL_TABLET | Freq: Three times a day (TID) | ORAL | Status: DC | PRN

## 2015-03-29 NOTE — Telephone Encounter (Signed)
Patient notified; pick up today, 03/29/15.

## 2015-03-29 NOTE — Telephone Encounter (Signed)
Patient picked up Rx

## 2015-03-29 NOTE — Telephone Encounter (Signed)
Prescription available, called patient, no answer 

## 2015-04-02 ENCOUNTER — Encounter (HOSPITAL_COMMUNITY): Payer: Self-pay | Admitting: Specialist

## 2015-04-04 ENCOUNTER — Ambulatory Visit (HOSPITAL_COMMUNITY): Payer: Medicare Other

## 2015-04-05 ENCOUNTER — Encounter (HOSPITAL_COMMUNITY): Payer: Self-pay | Admitting: Occupational Therapy

## 2015-04-05 ENCOUNTER — Ambulatory Visit (INDEPENDENT_AMBULATORY_CARE_PROVIDER_SITE_OTHER): Payer: Medicaid Other | Admitting: Psychiatry

## 2015-04-05 ENCOUNTER — Encounter (HOSPITAL_COMMUNITY): Payer: Self-pay | Admitting: Psychiatry

## 2015-04-05 VITALS — BP 134/82 | Ht 66.0 in | Wt 163.0 lb

## 2015-04-05 DIAGNOSIS — F4323 Adjustment disorder with mixed anxiety and depressed mood: Secondary | ICD-10-CM
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
## 2015-04-06 ENCOUNTER — Telehealth (HOSPITAL_COMMUNITY): Payer: Self-pay | Admitting: *Deleted

## 2015-04-06 ENCOUNTER — Ambulatory Visit (HOSPITAL_COMMUNITY): Payer: Medicare Other | Attending: Orthopedic Surgery

## 2015-04-06 ENCOUNTER — Encounter (HOSPITAL_COMMUNITY): Payer: Self-pay

## 2015-04-06 DIAGNOSIS — M6281 Muscle weakness (generalized): Secondary | ICD-10-CM | POA: Insufficient documentation

## 2015-04-06 DIAGNOSIS — M629 Disorder of muscle, unspecified: Secondary | ICD-10-CM | POA: Diagnosis present

## 2015-04-06 DIAGNOSIS — M25632 Stiffness of left wrist, not elsewhere classified: Secondary | ICD-10-CM

## 2015-04-06 DIAGNOSIS — M25532 Pain in left wrist: Secondary | ICD-10-CM | POA: Diagnosis not present

## 2015-04-06 DIAGNOSIS — M6289 Other specified disorders of muscle: Secondary | ICD-10-CM

## 2015-04-06 NOTE — Therapy (Signed)
[facility] [address] [place], [place], [address] Phone: [phone]   Fax:  [phone]  Occupational Therapy Treatment  Patient Details  Name: [name] MRN: [id number] Date of Birth: 07-26-1971 Referring Provider:  Vickki Hearing, MD  Encounter Date: 04/06/2015      OT End of Session - 04/06/15 1142    Visit Number 7   Number of Visits 16   Date for OT Re-Evaluation 05/07/15   mini reassess on 6/30   Authorization Type Medicare Part A & B    Authorization Time Period Before 17th visit and progress note   Authorization - Visit Number 7   Authorization - Number of Visits 17   OT Start Time 0935   OT Stop Time 1025   OT Time Calculation (min) 50 min   Activity Tolerance Patient tolerated treatment well   Behavior During Therapy [facility] for tasks assessed/performed      Past Medical History  Diagnosis Date  . Pregnancy   . SVD (spontaneous vaginal delivery)     x 1  . Headache(784.0)   . Fibromyalgia   . Arthritis     hands, knees    Past Surgical History  Procedure Laterality Date  . Cesarean section      x 4  . Wisdom tooth extraction    . Right thumb surgery      pins    There were no vitals filed for this visit.  Visit Diagnosis:  Pain in left wrist  Tight fascia  Muscle weakness (generalized)  Stiffness of wrist joint, left      Subjective Assessment - 04/06/15 1129    Subjective  S: I always have pain and I can't use it.    Currently in Pain? Yes   Pain Score 6    Pain Location Wrist   Pain Orientation Left   Pain Descriptors / Indicators Constant   Pain Type Chronic pain            [facility] OT Assessment - 04/06/15 1130    Assessment   Diagnosis s/p left ulnar fx   Precautions   Precautions None   AROM   Overall AROM Comments Wrist measurements taken with gravity. On eval, measurements were taken against gravity. During this reassessment, patient was unable to complete any AROM  against gravity.    AROM Assessment Site Forearm;Wrist   Right/Left Elbow --   Right/Left Forearm Left   Left Forearm Pronation 50 Degrees  on eval: 30   Left Forearm Supination 90 Degrees  on eval: 0   Right/Left Wrist Left   Left Wrist Extension 40 Degrees  on eval 0 against gravity   Left Wrist Flexion 60 Degrees  on eval: 0 against gravity   Left Wrist Radial Deviation 12 Degrees  on eval: 0 against gravity   Left Wrist Ulnar Deviation 36 Degrees  on eval: 0 against gravity   PROM   PROM Assessment Site Forearm;Wrist   Right/Left Elbow Left  WFL PROM   Right/Left Forearm Left   Right/Left Wrist Left   Left Wrist Extension 50 Degrees  on eval: 20 against gravity   Left Wrist Flexion 72 Degrees  on eval: 20 against gravity   Strength   Strength Assessment Site Hand   Right/Left hand Left   Left Hand Gross Grasp Impaired   Left Hand Grip (lbs) 20  on eval: 10   Left Hand Lateral Pinch 6 lbs  on eval: 2  Left Hand 3 Point Pinch 4 lbs  on eval: 2                  OT Treatments/Exercises (OP) - 04/06/15 1141    Modalities   Modalities Electrical Stimulation;Moist Heat   Moist Heat Therapy   Number Minutes Moist Heat 10 Minutes   Moist Heat Location Wrist   Electrical Stimulation   Electrical Stimulation Location left wrist   Electrical Stimulation Action interferential   Electrical Stimulation Parameters 10.6 CV   Electrical Stimulation Goals Pain                  OT Short Term Goals - 04/06/15 0958    OT SHORT TERM GOAL #1   Title Pt will be educated on HEP.    Status Achieved   OT SHORT TERM GOAL #2   Title Pt will decrease pain to 3/10 during daily activities.    Status On-going   OT SHORT TERM GOAL #3   Title Pt will decrease fascial restrictions from mod amount to min amounts in left forearm.    Status Achieved   OT SHORT TERM GOAL #4   Title Pt will increase wrist AROM by 30 degrees to increase ability to use LUE as dominant.     Status Achieved   OT SHORT TERM GOAL #5   Title Pt will increase wrist and elbow strength to 3/5 to increase ability to perform grooming tasks.    Status Partially Met   OT SHORT TERM GOAL #6   Title Pt will increase grip strength by 15# and pinch strength by 5# to increase ability to hold onto grooming tools used to fix her hair.    Status On-going           OT Long Term Goals - 03/19/15 0912    OT LONG TERM GOAL #1   Title Pt will return to highest level of functioning and independence in daily tasks.    Status On-going   OT LONG TERM GOAL #2   Title Pt will decrease fascial restrictions from min to trace amounts or less.    Status On-going   OT LONG TERM GOAL #3   Title Pt will increase AROM of wrist & elbow to WNL to increase ability to perform childcare tasks using LUE as dominant.    Status On-going   OT LONG TERM GOAL #4   Title Pt will increase strength of wrist and elbow to 4/5 to increase ability to steer her jet ski.    Period Weeks   Status On-going   OT LONG TERM GOAL #5   Title Pt will increase grip strength by 30# and pinch strength by 10# to increase ability to hold 44 year old.    Status On-going   OT LONG TERM GOAL #6   Title Patient will increase Left hand coordination by completing 9 hole peg test in 20" or less.    Status On-going               Plan - 04/06/15 1142    Clinical Impression Statement Mini reassessment completed this date. patient continues to have increased pain in left wrist and is unable to use it as dominent extremity. Patient states she is unable to use her hand to hold onto or grip items without dropping it. Patient has met 3/6 STGs. Patient does have a diagnosis of fibromyalgia which she is not currently managing and is not prescribed any medication. Discussed that Fibromyalgia  is a syndrome that affects muscles and soft tissue causing fatigue, chronic muscle pain, and painful trigger or tender points . Recommended that she see a  MD that specializes in Fibromyalgia and begin a treatment plan to manage this.  Also recommended that we continue therapy through the recommended 8 wks and if nothing improves she should return to see Dr. Aline Brochure for further management.    Plan P: continue functional exercises and velcro board. Complete cooking task to improve functional mobilty and strength in her left wrist. Cont therapy for 4 more weeks.           G-Codes - 2015-04-10 17-Dec-1206    Functional Assessment Tool Used FOTO score: 32/100 (68% impaired)   Functional Limitation Carrying, moving and handling objects   Carrying, Moving and Handling Objects Current Status [phone]) At least 60 percent but less than 80 percent impaired, limited or restricted   Carrying, Moving and Handling Objects Goal Status (L9357) At least 20 percent but less than 40 percent impaired, limited or restricted      Problem List Patient Active Problem List   Diagnosis Date Noted  . Adjustment disorder with mixed anxiety and depressed mood 02/21/2015  . Dysmenorrhea 01/08/2015  . Dyspareunia 01/08/2015  . Uterus retroversion 01/08/2015  . Sterilization 06/25/2012  Occupational Therapy Progress Note  Dates of Reporting Period: 03/08/15 to 2015/04/10  Objective Reports of Subjective Statement:See clinical impression statement.  Objective Measurements: See measurements above.  Goal Update: See goals above.  Plan: Cont with skilled OT services through 8 weeks to decrease pain and fascial restrictions and increase ROM and strength to increase ability to use Left hand as dominant extremity.   Reason Skilled Services are Required: Patient is unable to use LUE as dominant extremity for functional tasks. Patient continues to have increased pain and muscle weakness.    Ailene Ravel, OTR/L,CBIS  [phone]  04-10-15, 12:19 PM  [facility] [address] [place], [place], [address] Phone: [phone]   Fax:   [phone]

## 2015-04-10 ENCOUNTER — Encounter (HOSPITAL_COMMUNITY): Payer: Self-pay

## 2015-04-10 ENCOUNTER — Ambulatory Visit (HOSPITAL_COMMUNITY): Payer: Medicare Other

## 2015-04-11 ENCOUNTER — Ambulatory Visit (HOSPITAL_COMMUNITY): Payer: Medicare Other | Admitting: Specialist

## 2015-04-12 ENCOUNTER — Encounter (HOSPITAL_COMMUNITY): Payer: Self-pay

## 2015-04-13 ENCOUNTER — Ambulatory Visit (HOSPITAL_COMMUNITY): Payer: Medicare Other | Admitting: Specialist

## 2015-04-13 DIAGNOSIS — M25632 Stiffness of left wrist, not elsewhere classified: Secondary | ICD-10-CM

## 2015-04-13 DIAGNOSIS — M25532 Pain in left wrist: Secondary | ICD-10-CM

## 2015-04-13 DIAGNOSIS — M6281 Muscle weakness (generalized): Secondary | ICD-10-CM

## 2015-04-13 NOTE — Therapy (Signed)
[name], [place], [address] Phone: [phone]   Fax:  [phone]  Occupational Therapy Treatment  Patient Details  Name: [name] MRN: [id number] Date of Birth: 1970-10-14 Referring Provider:  Carole Civil, MD  Encounter Date: 04/13/2015      OT End of Session - 04/13/15 1208    Visit Number 8   Number of Visits 16   Date for OT Re-Evaluation 05/07/15   Authorization Type Medicare Part A & B    Authorization Time Period Before 17th visit and progress note   Authorization - Visit Number 8   Authorization - Number of Visits 17   OT Start Time 1030   OT Stop Time 1120   OT Time Calculation (min) 50 min   Activity Tolerance Patient tolerated treatment well   Behavior During Therapy [facility] for tasks assessed/performed      Past Medical History  Diagnosis Date  . Pregnancy   . SVD (spontaneous vaginal delivery)     x 1  . Headache(784.0)   . Fibromyalgia   . Arthritis     hands, knees    Past Surgical History  Procedure Laterality Date  . Cesarean section      x 4  . Wisdom tooth extraction    . Right thumb surgery      pins    There were no vitals filed for this visit.  Visit Diagnosis:  Pain in left wrist  Muscle weakness (generalized)  Stiffness of wrist joint, left      Subjective Assessment - 04/13/15 1206    Subjective  S:  It just feels so tight.  The thing you did last time (IFES and heat) made it feel alot better.)   Currently in Pain? Yes   Pain Score 7    Pain Location Wrist   Pain Orientation Left   Pain Descriptors / Indicators Constant   Pain Type Chronic pain            [facility] OT Assessment - 04/13/15 0001    Assessment   Diagnosis s/p left ulnar fx   Precautions   Precautions None                  OT Treatments/Exercises (OP) - 04/13/15 0001    Wrist Exercises   Other wrist exercises attempted velcro board, patient unable to complete due  to increased pain.   Modalities   Modalities Electrical Stimulation;Moist Heat   Moist Heat Therapy   Number Minutes Moist Heat 15 Minutes   Moist Heat Location Wrist   Electrical Stimulation   Electrical Stimulation Location left wrist   Electrical Stimulation Action interferential sweeping   Electrical Stimulation Parameters 11.0 CV   Electrical Stimulation Goals Pain   Manual Therapy   Manual Therapy Myofascial release   Myofascial Release Myofascial release to left bdorsal wrist and forearm regions to decrease fascial restrictions and increase pain free joint mobility                  OT Short Term Goals - 04/06/15 0958    OT SHORT TERM GOAL #1   Title Pt will be educated on HEP.    Status Achieved   OT SHORT TERM GOAL #2   Title Pt will decrease pain to 3/10 during daily activities.    Status On-going   OT SHORT TERM GOAL #3   Title Pt will decrease fascial restrictions from mod amount to min  amounts in left forearm.    Status Achieved   OT SHORT TERM GOAL #4   Title Pt will increase wrist AROM by 30 degrees to increase ability to use LUE as dominant.    Status Achieved   OT SHORT TERM GOAL #5   Title Pt will increase wrist and elbow strength to 3/5 to increase ability to perform grooming tasks.    Status Partially Met   OT SHORT TERM GOAL #6   Title Pt will increase grip strength by 15# and pinch strength by 5# to increase ability to hold onto grooming tools used to fix her hair.    Status On-going           OT Long Term Goals - 03/19/15 0912    OT LONG TERM GOAL #1   Title Pt will return to highest level of functioning and independence in daily tasks.    Status On-going   OT LONG TERM GOAL #2   Title Pt will decrease fascial restrictions from min to trace amounts or less.    Status On-going   OT LONG TERM GOAL #3   Title Pt will increase AROM of wrist & elbow to WNL to increase ability to perform childcare tasks using LUE as dominant.    Status  On-going   OT LONG TERM GOAL #4   Title Pt will increase strength of wrist and elbow to 4/5 to increase ability to steer her jet ski.    Period Weeks   Status On-going   OT LONG TERM GOAL #5   Title Pt will increase grip strength by 30# and pinch strength by 10# to increase ability to hold 44 year old.    Status On-going   OT LONG TERM GOAL #6   Title Patient will increase Left hand coordination by completing 9 hole peg test in 20" or less.    Status On-going               Plan - 04/13/15 1208    Clinical Impression Statement A:  Patient reporting 7/10 pain and difficulty with activities such as velcro board due to pain.  MFR to volar and dorsal forearm with good vasomotor response.  Patient had relief from pain with IFES and heat, therefore continued that treatment this session.    Plan P:  Cooking task so that we can educated on joint protection techniques and improve functional wrist AROM and strength in her left wrist.         Problem List Patient Active Problem List   Diagnosis Date Noted  . Adjustment disorder with mixed anxiety and depressed mood 02/21/2015  . Dysmenorrhea 01/08/2015  . Dyspareunia 01/08/2015  . Uterus retroversion 01/08/2015  . Sterilization 06/25/2012    Vangie Bicker, OTR/L [phone]  04/13/2015, 12:12 PM  [facility] [address] [place], [place], [address] Phone: [phone]   Fax:  [phone]

## 2015-04-16 ENCOUNTER — Encounter (HOSPITAL_COMMUNITY): Payer: Self-pay | Admitting: Specialist

## 2015-04-18 ENCOUNTER — Encounter (HOSPITAL_COMMUNITY): Payer: Self-pay | Admitting: Occupational Therapy

## 2015-04-18 ENCOUNTER — Ambulatory Visit (HOSPITAL_COMMUNITY): Payer: Medicare Other

## 2015-04-20 ENCOUNTER — Encounter (HOSPITAL_COMMUNITY): Payer: Self-pay | Admitting: Occupational Therapy

## 2015-04-20 ENCOUNTER — Ambulatory Visit (HOSPITAL_COMMUNITY): Payer: Medicare Other

## 2015-04-23 ENCOUNTER — Encounter (HOSPITAL_COMMUNITY): Payer: Self-pay | Admitting: Specialist

## 2015-04-25 ENCOUNTER — Encounter (HOSPITAL_COMMUNITY): Payer: Self-pay

## 2015-04-25 ENCOUNTER — Telehealth: Payer: Self-pay | Admitting: Orthopedic Surgery

## 2015-04-25 ENCOUNTER — Encounter (HOSPITAL_COMMUNITY): Payer: Self-pay | Admitting: Occupational Therapy

## 2015-04-25 ENCOUNTER — Telehealth (HOSPITAL_COMMUNITY): Payer: Self-pay

## 2015-04-25 ENCOUNTER — Ambulatory Visit (HOSPITAL_COMMUNITY): Payer: Medicare Other

## 2015-04-25 NOTE — Therapy (Signed)
[facility] [address] [place], [place], [address] Phone: [phone]   Fax:  [phone]  Patient Details  Name: [name] MRN: [id number] Date of Birth: 03/14/1971 Referring Provider:  No ref. provider found  Encounter Date: 04/25/2015 OCCUPATIONAL THERAPY DISCHARGE SUMMARY  Visits from Start of Care: 8  Current functional level related to goals / functional outcomes: OT LONG TERM GOAL #1    Title Pt will return to highest level of functioning and independence in daily tasks.    Status On-going   OT LONG TERM GOAL #2   Title Pt will decrease fascial restrictions from min to trace amounts or less.    Status On-going   OT LONG TERM GOAL #3   Title Pt will increase AROM of wrist & elbow to WNL to increase ability to perform childcare tasks using LUE as dominant.    Status On-going   OT LONG TERM GOAL #4   Title Pt will increase strength of wrist and elbow to 4/5 to increase ability to steer her jet ski.    Period Weeks   Status On-going   OT LONG TERM GOAL #5   Title Pt will increase grip strength by 30# and pinch strength by 10# to increase ability to hold 44 year old.    Status On-going   OT LONG TERM GOAL #6   Title Patient will increase Left hand coordination by completing 9 hole peg test in 20" or less.    Status On-going          Remaining deficits: Pt has not met any long term goals in therapy. Pt's main complaint is increased pain with any movement or use of her left wrist. Pt's strength and ROM has increased since the beginning of therapy services although progress has been very slow.  Pt has had 3 no shows in a row and is being discharged due to non compliance with attendance policy.   Education / Equipment: AROM and wrist stretches. Plan:                                                    Patient goals were not met. Patient is being discharged due to not returning  since the last visit.  ?????       Ailene Ravel, OTR/L,CBIS  [phone]  04/25/2015, 12:11 PM  [facility] [address] [place], [place], [address] Phone: [phone]   Fax:  [phone]

## 2015-04-25 NOTE — Telephone Encounter (Signed)
7/20 Called and spoke to patient to let her know that she had been discharged per therapist and would need a new referral to resume therapy.  She said ok.

## 2015-04-25 NOTE — Telephone Encounter (Signed)
Patient is calling requesting a medication refill on HYDROcodone-acetaminophen (NORCO/VICODIN) 5-325 MG per tablet please advise?

## 2015-04-26 ENCOUNTER — Other Ambulatory Visit: Payer: Self-pay | Admitting: *Deleted

## 2015-04-26 ENCOUNTER — Encounter (HOSPITAL_COMMUNITY): Payer: Self-pay

## 2015-04-26 MED ORDER — HYDROCODONE-ACETAMINOPHEN 5-325 MG PO TABS: 1.0000 | ORAL_TABLET | Freq: Three times a day (TID) | ORAL | Status: DC | PRN

## 2015-04-26 NOTE — Telephone Encounter (Signed)
Prescription available, called patient, no answer 

## 2015-04-27 ENCOUNTER — Encounter (HOSPITAL_COMMUNITY): Payer: Self-pay

## 2015-04-27 ENCOUNTER — Encounter (HOSPITAL_COMMUNITY): Payer: Self-pay | Admitting: Occupational Therapy

## 2015-04-30 ENCOUNTER — Telehealth: Payer: Self-pay | Admitting: Orthopedic Surgery

## 2015-04-30 NOTE — Telephone Encounter (Signed)
Patient states that [facility] out-patient rehab [facility]) needs orders updated for her to continue her therapy there, due to her having missed 3 appointments.  States had been with friend at [facility] for several days, and asked her daughter to call there to change her appointments, but said daughter called the wrong place.  Please advise.  (Patient aware to call to rehab office directly to re-schedule.)  Patient [phone].

## 2015-04-30 NOTE — Telephone Encounter (Signed)
04/30/15 - Patient called; aware.  Picked up prescription 12:07p.m, 04/30/15.

## 2015-05-01 ENCOUNTER — Other Ambulatory Visit: Payer: Self-pay | Admitting: *Deleted

## 2015-05-01 DIAGNOSIS — S52202D Unspecified fracture of shaft of left ulna, subsequent encounter for closed fracture with routine healing: Secondary | ICD-10-CM

## 2015-05-01 NOTE — Telephone Encounter (Signed)
ORDER FAXED AS REQUESTED

## 2015-05-02 ENCOUNTER — Encounter (HOSPITAL_COMMUNITY): Payer: Self-pay

## 2015-05-03 ENCOUNTER — Encounter (HOSPITAL_COMMUNITY): Payer: Self-pay

## 2015-05-04 ENCOUNTER — Encounter (HOSPITAL_COMMUNITY): Payer: Self-pay

## 2015-05-04 ENCOUNTER — Encounter (HOSPITAL_COMMUNITY): Payer: Self-pay | Admitting: Occupational Therapy

## 2015-05-10 ENCOUNTER — Encounter (HOSPITAL_COMMUNITY): Payer: Self-pay

## 2015-05-11 ENCOUNTER — Encounter (HOSPITAL_COMMUNITY): Payer: Self-pay

## 2015-05-17 ENCOUNTER — Encounter (HOSPITAL_COMMUNITY): Payer: Self-pay | Admitting: Psychiatry

## 2015-05-17 ENCOUNTER — Ambulatory Visit (HOSPITAL_COMMUNITY): Payer: Self-pay | Admitting: Psychiatry

## 2015-05-21 ENCOUNTER — Ambulatory Visit: Payer: Self-pay | Admitting: Orthopedic Surgery

## 2015-05-21 ENCOUNTER — Encounter: Payer: Self-pay | Admitting: Orthopedic Surgery

## 2015-05-28 ENCOUNTER — Telehealth: Payer: Self-pay | Admitting: Orthopedic Surgery

## 2015-05-28 NOTE — Telephone Encounter (Signed)
Routing to Dr Aline Brochure for approval since patient has not returned for follow up

## 2015-05-28 NOTE — Telephone Encounter (Signed)
Please advise if patient calls back

## 2015-05-28 NOTE — Telephone Encounter (Addendum)
Patient is calling stating she [place] has 4 weeks of Rehab she missed a lot of Therapy appointments due to personal reasons and PT has been booked and she just now is getting back to PT, she didn't come to the appointment last week because she thought she wasn't supposed to come back here until after her PT is finished. She is asking for theHYDROcodone-acetaminophen (NORCO/VICODIN) 5-325 MG per tablet  Until she can get through the Rehab, please advise?

## 2015-05-28 NOTE — Telephone Encounter (Signed)
Declined she ahs been releasesd

## 2015-05-28 NOTE — Telephone Encounter (Signed)
Routing to Dr Harrison 

## 2015-05-28 NOTE — Telephone Encounter (Signed)
Patient called to request refill of medication: HYDROcodone-acetaminophen (NORCO/VICODIN) 5-325 MG per tablet [[id number]]  -  She is aware of missed appointment last week.  Please advise.  Ph# [phone]

## 2015-05-29 ENCOUNTER — Ambulatory Visit (HOSPITAL_COMMUNITY): Payer: Medicare Other | Attending: Orthopedic Surgery

## 2015-05-29 ENCOUNTER — Encounter (HOSPITAL_COMMUNITY): Payer: Self-pay

## 2015-05-29 DIAGNOSIS — S52202D Unspecified fracture of shaft of left ulna, subsequent encounter for closed fracture with routine healing: Secondary | ICD-10-CM | POA: Diagnosis present

## 2015-05-29 DIAGNOSIS — M25532 Pain in left wrist: Secondary | ICD-10-CM | POA: Diagnosis present

## 2015-05-29 NOTE — Therapy (Signed)
[place], [place], [address] Phone: [phone]   Fax:  [phone]  Occupational Therapy Evaluation  Patient Details  Name: [name] MRN: [id number] Date of Birth: 01-17-1971 Referring Provider:  Carole Civil, MD  Encounter Date: 05/29/2015      OT End of Session - 05/29/15 1319    Visit Number 1   Number of Visits 1   Authorization Type Medicare Part A & B    Authorization - Visit Number 1   Authorization - Number of Visits 10   OT Start Time 1025   OT Stop Time 1110   OT Time Calculation (min) 45 min   Activity Tolerance Patient tolerated treatment well      Past Medical History  Diagnosis Date  . Pregnancy   . SVD (spontaneous vaginal delivery)     x 1  . Headache(784.0)   . Fibromyalgia   . Arthritis     hands, knees    Past Surgical History  Procedure Laterality Date  . Cesarean section      x 4  . Wisdom tooth extraction    . Right thumb surgery      pins    There were no vitals filed for this visit.  Visit Diagnosis:  Pain in left wrist - Plan: Ot plan of care cert/re-cert  Left ulnar fracture, closed, with routine healing, subsequent encounter - Plan: Ot plan of care cert/re-cert      Subjective Assessment - 05/29/15 1311    Subjective  S: It just hurts all the time. I can't do anything left handed.    Pertinent History Patient is a 44 y/o female returning to clinic for s/p left ulnar fracture. Patient had been receiving therapy treatment from (03/08/15-04/13/15). During therapy patient's pain remained severe. Patient showed progress with her ROM and strength. Pt frequently complained of increased pain with use, feelings of tightness in her wrist. In the end, patient was discharged due to lack of attendance. Pt was told that she would need a new referral to continue therapy. Patient presents today after 6 weeks from her last therapy visit.     Special Tests FOTO score: 39/100  (61% impaired)   Currently in Pain? Yes   Pain Score 7    Pain Location Wrist   Pain Orientation Left   Pain Descriptors / Indicators Constant   Pain Type Chronic pain           [facility] OT Assessment - 05/29/15 1038    Assessment   Diagnosis s/p left ulnar fx   Precautions   Precautions None   Palpation   Palpation comment Zero fascial restrictions in left wrist and forearm.    AROM   AROM Assessment Site Forearm;Wrist   Right/Left Forearm Left   Left Forearm Pronation 70 Degrees   Left Forearm Supination 90 Degrees   Right/Left Wrist Left   Left Wrist Extension 48 Degrees   Left Wrist Flexion 30 Degrees   Left Wrist Radial Deviation 10 Degrees   Left Wrist Ulnar Deviation 30 Degrees   PROM   Overall PROM  Within functional limits for tasks performed   Strength   Strength Assessment Site Hand;Wrist;Forearm   Right/Left Forearm Left   Left Forearm Pronation 3/5   Left Forearm Supination 3/5   Right/Left Wrist Left   Left Wrist Flexion 3/5   Left Wrist Extension 3/5   Left Wrist Radial Deviation 3/5   Left Wrist Ulnar  Deviation 3/5   Right/Left hand Left   Left Hand Gross Grasp Impaired   Left Hand Grip (lbs) 20   Left Hand Lateral Pinch 6 lbs   Left Hand 3 Point Pinch 4 lbs            OT Education - 2015/06/22 1319    Education provided Yes   Education Details Wrist stretches   Person(s) Educated Patient   Methods Explanation;Demonstration;Handout   Comprehension Verbalized understanding;Returned demonstration          OT Short Term Goals - 06-22-2015 1327    OT SHORT TERM GOAL #1   Title Pt will be educated on HEP.    Time 1   Period Days   Status Achieved           OT Long Term Goals - 03/19/15 0912    OT LONG TERM GOAL #1   Title Pt will return to highest level of functioning and independence in daily tasks.    Status On-going   OT LONG TERM GOAL #2   Title Pt will decrease fascial restrictions from min to trace amounts or less.    Status  On-going   OT LONG TERM GOAL #3   Title Pt will increase AROM of wrist & elbow to WNL to increase ability to perform childcare tasks using LUE as dominant.    Status On-going   OT LONG TERM GOAL #4   Title Pt will increase strength of wrist and elbow to 4/5 to increase ability to steer her jet ski.    Period Weeks   Status On-going   OT LONG TERM GOAL #5   Title Pt will increase grip strength by 30# and pinch strength by 10# to increase ability to hold 44 year old.    Status On-going   OT LONG TERM GOAL #6   Title Patient will increase Left hand coordination by completing 9 hole peg test in 20" or less.    Status On-going               Plan - 06-22-2015 1319    Clinical Impression Statement A: Patient is a 44 y/o female returning to clinic for s/p left ulnar fracture. Patient had been receiving therapy treatment from (03/08/15-04/13/15) for left wrist pain s/p ulna fracture. Pt reports increased pain and tightness in left wrist which prevents her from completing daily tasks. Measurements were taken this date and they remained the same as last reassessment (04/06/15). Disccused that patient should return to MD as therapy did not help previously and her ROM and pain level has stayed constant. Pt was given wrist stretches to complete independently as she states her wrist frequently feels tight. Pt states that the TENS unit and heat were the only things that helped her pain. Provided patient will information on how to purchase a TENS unit. Recommended patient purchase or borrow a heating pad if that is the only thing that helps the pain.  Pt was not pleased with therapist's recommendation.   Rehab Potential Good   OT Frequency 1x / week   OT Duration --  1 week   OT Treatment/Interventions Patient/family education   Plan P: 1 time eval only with HEP. Recommendations: Return to MD for alternate treatment for left wrist pain. Purcahse heating pad and TENS unit for pain.    Consulted and Agree with  Plan of Care Patient          G-Codes - 2015-06-22 1327    Functional Assessment  Tool Used FOTO score: 39/100 (61% impaired)   Functional Limitation Carrying, moving and handling objects   Carrying, Moving and Handling Objects Current Status [id number]) At least 60 percent but less than 80 percent impaired, limited or restricted   Carrying, Moving and Handling Objects Goal Status (Q9450) At least 60 percent but less than 80 percent impaired, limited or restricted   Carrying, Moving and Handling Objects Discharge Status [phone]) At least 60 percent but less than 80 percent impaired, limited or restricted      Problem List Patient Active Problem List   Diagnosis Date Noted  . Adjustment disorder with mixed anxiety and depressed mood 02/21/2015  . Dysmenorrhea 01/08/2015  . Dyspareunia 01/08/2015  . Uterus retroversion 01/08/2015  . Sterilization 06/25/2012    Ailene Ravel, OTR/L,CBIS  [phone]  05/29/2015, 1:30 PM  [facility] [address] [place], [place], [address] Phone: [phone]   Fax:  [phone]

## 2015-05-29 NOTE — Patient Instructions (Signed)
10 second holds. Repeat 3 times.   WRIST EXTENSOR STRETCH  Use your unaffected hand to bend the affected wrist down as shown.   Keep the elbow straight on the affected side the entire time.      WRIST FLEXOR STRETCH  Use your unaffected hand to bend the affected wrist up as shown.   Keep the elbow straight on the affected side the entire time.     Wrist Flexor Stretch  Gently pull wrist into extension with elbow extended, while keeping shoulder down.       Wrist Extensor Stretch  Gently bring wrist into flexion on wall, extend elbow straight while keeping the shoulder down.    PRAYER STRETCH - WRIST  Place the palms of your hands together to stretch the wrist as shown.      RADIAL DEVIATION STRETCH  Grasp your hand and bent it towards the thumb side as shown.      ULNAR DEVIATION STRETCH  Grasp your hand and bent it towards the little finger side as shown.     Recommendations:   -Return to MD as therapy as not helped with pain. Pain continues to be elevated with all daily tasks. -Purchase a TENS unit from [employer] or [place] a heating pad.

## 2015-06-14 ENCOUNTER — Ambulatory Visit: Payer: Self-pay | Admitting: Orthopedic Surgery

## 2015-06-25 ENCOUNTER — Other Ambulatory Visit: Payer: Self-pay | Admitting: *Deleted

## 2015-06-25 MED ORDER — GABAPENTIN 100 MG PO CAPS: 100.0000 mg | ORAL_CAPSULE | Freq: Three times a day (TID) | ORAL | Status: DC

## 2015-06-27 ENCOUNTER — Emergency Department (HOSPITAL_COMMUNITY)
Admission: EM | Admit: 2015-06-27 | Discharge: 2015-06-27 | Disposition: A | Discharge status: Home / Self Care | Payer: Medicare Other | Attending: Emergency Medicine | Admitting: Emergency Medicine

## 2015-06-27 ENCOUNTER — Encounter (HOSPITAL_COMMUNITY): Payer: Self-pay | Admitting: Emergency Medicine

## 2015-06-27 DIAGNOSIS — Y9389 Activity, other specified: Secondary | ICD-10-CM | POA: Diagnosis not present

## 2015-06-27 DIAGNOSIS — X58XXXA Exposure to other specified factors, initial encounter: Secondary | ICD-10-CM | POA: Insufficient documentation

## 2015-06-27 DIAGNOSIS — Y998 Other external cause status: Secondary | ICD-10-CM | POA: Insufficient documentation

## 2015-06-27 DIAGNOSIS — M797 Fibromyalgia: Secondary | ICD-10-CM | POA: Diagnosis not present

## 2015-06-27 DIAGNOSIS — Y9289 Other specified places as the place of occurrence of the external cause: Secondary | ICD-10-CM | POA: Diagnosis not present

## 2015-06-27 DIAGNOSIS — Z720 Tobacco use: Secondary | ICD-10-CM | POA: Insufficient documentation

## 2015-06-27 DIAGNOSIS — Z91040 Latex allergy status: Secondary | ICD-10-CM | POA: Diagnosis not present

## 2015-06-27 DIAGNOSIS — Z880 Allergy status to penicillin: Secondary | ICD-10-CM | POA: Insufficient documentation

## 2015-06-27 DIAGNOSIS — Z79899 Other long term (current) drug therapy: Secondary | ICD-10-CM | POA: Insufficient documentation

## 2015-06-27 DIAGNOSIS — M1990 Unspecified osteoarthritis, unspecified site: Secondary | ICD-10-CM | POA: Diagnosis not present

## 2015-06-27 DIAGNOSIS — S39012A Strain of muscle, fascia and tendon of lower back, initial encounter: Secondary | ICD-10-CM | POA: Diagnosis not present

## 2015-06-27 DIAGNOSIS — S3992XA Unspecified injury of lower back, initial encounter: Secondary | ICD-10-CM | POA: Diagnosis present

## 2015-06-27 LAB — URINALYSIS, ROUTINE W REFLEX MICROSCOPIC
Bilirubin Urine: NEGATIVE
GLUCOSE, UA: NEGATIVE mg/dL
Hgb urine dipstick: NEGATIVE
KETONES UR: NEGATIVE mg/dL
Leukocytes, UA: NEGATIVE
Nitrite: NEGATIVE
Protein, ur: NEGATIVE mg/dL
Specific Gravity, Urine: 1.02 (ref 1.005–1.030)
Urobilinogen, UA: 0.2 mg/dL (ref 0.0–1.0)
pH: 6.5 (ref 5.0–8.0)

## 2015-06-27 LAB — POC URINE PREG, ED: Preg Test, Ur: NEGATIVE

## 2015-06-27 MED ORDER — CYCLOBENZAPRINE HCL 10 MG PO TABS: 10.0000 mg | ORAL_TABLET | Freq: Three times a day (TID) | ORAL | Status: DC | PRN

## 2015-06-27 MED ORDER — HYDROCODONE-ACETAMINOPHEN 5-325 MG PO TABS: ORAL_TABLET | ORAL | Status: DC

## 2015-06-27 NOTE — ED Notes (Signed)
Pt made aware to return if symptoms worsen or if any life threatening symptoms occur.   

## 2015-06-27 NOTE — Discharge Instructions (Signed)

## 2015-06-27 NOTE — ED Provider Notes (Signed)
CSN: [id number]     Arrival date & time 06/27/15  1452 History   First MD Initiated Contact with Patient 06/27/15 1530     Chief Complaint  Patient presents with  . Back Pain     (Consider location/radiation/quality/duration/timing/severity/associated sxs/prior Treatment) HPI   [name] is a 44 y.o. female who presents to the Emergency Department complaining of diffuse low back pain since last evening.  She states that she was moving furniture yesterday and lifting, pushing and twisting frequently.  Noticed aching pain in her low back last evening, but woke up with worsening pain today.  She has applied heat and taken aleve without relief.  She states pain is worse with movement and improves at rest. She denies pain, numbness or weakness to the lower extremities, abd pain, dysuria, urine or bowel incontinence or retention, and fever.     Past Medical History  Diagnosis Date  . Pregnancy   . SVD (spontaneous vaginal delivery)     x 1  . Headache(784.0)   . Fibromyalgia   . Arthritis     hands, knees   Past Surgical History  Procedure Laterality Date  . Cesarean section      x 4  . Wisdom tooth extraction    . Right thumb surgery      pins   Family History  Problem Relation Age of Onset  . Diabetes Other   . Heart failure Other   . Hypertension Other   . Cancer Other   . Hypertension Mother   . Diabetes Mother   . Cancer Mother     lung  . CAD Father   . Heart failure Father   . Heart disease Father   . Seizures Son    Social History  Substance Use Topics  . Smoking status: Current Every Day Smoker -- 0.50 packs/day for 10 years    Types: Cigarettes  . Smokeless tobacco: Never Used  . Alcohol Use: No   OB History    Gravida Para Term Preterm AB TAB SAB Ectopic Multiple Living   7 6 6  1  1   6      Review of Systems  Constitutional: Negative for fever.  Respiratory: Negative for shortness of breath.   Gastrointestinal: Negative for vomiting, abdominal  pain and constipation.  Genitourinary: Negative for dysuria, hematuria, flank pain, decreased urine volume and difficulty urinating.  Musculoskeletal: Positive for back pain. Negative for joint swelling.  Skin: Negative for rash.  Neurological: Negative for weakness and numbness.  All other systems reviewed and are negative.     Allergies  Aspirin; Latex; Phenergan; and Penicillins  Home Medications   Prior to Admission medications   Medication Sig Start Date End Date Taking? Authorizing Provider  gabapentin (NEURONTIN) 100 MG capsule Take 1 capsule (100 mg total) by mouth 3 (three) times daily. 06/25/15  Yes Carole Civil, MD  acyclovir (ZOVIRAX) 400 MG tablet TAKE ONE TABLET BY MOUTH TWICE DAILY FOR 30 DAYS. Patient not taking: Reported on 06/27/2015 07/17/14   Jonnie Kind, MD  albuterol (ACCUNEB) 1.25 MG/3ML nebulizer solution Take 3 mLs (1.25 mg total) by nebulization every 6 (six) hours as needed for wheezing. Patient not taking: Reported on 06/27/2015 09/20/14   Evalee Jefferson, PA-C  azithromycin (ZITHROMAX) 250 MG tablet Take 2 tablets by mouth on day one followed by one tablet daily for 4 days. Patient not taking: Reported on 06/27/2015 02/02/15   Evalee Jefferson, PA-C  benzonatate (TESSALON) 100 MG  capsule Take 2 capsules (200 mg total) by mouth 3 (three) times daily as needed. Patient not taking: Reported on 06/27/2015 02/02/15   Evalee Jefferson, PA-C  HYDROcodone-acetaminophen (NORCO/VICODIN) 5-325 MG per tablet Take 1 tablet by mouth every 8 (eight) hours as needed for moderate pain. Patient not taking: Reported on 06/27/2015 04/26/15   Carole Civil, MD  HYDROcodone-homatropine [facility]) 5-1.5 MG/5ML syrup Take 5 mLs by mouth every 6 (six) hours as needed. Patient not taking: Reported on 06/27/2015 02/07/15   Lily Kocher, PA-C  ibuprofen (ADVIL,MOTRIN) 800 MG tablet Take 1 tablet (800 mg total) by mouth every 8 (eight) hours as needed. Patient not taking: Reported on 06/27/2015  03/13/15   Carole Civil, MD  loratadine-pseudoephedrine (CLARITIN-D 12 HOUR) 5-120 MG per tablet Take 1 tablet by mouth 2 (two) times daily. Patient not taking: Reported on 06/27/2015 02/07/15   Lily Kocher, PA-C  mirtazapine (REMERON) 15 MG tablet Take 1 tablet (15 mg total) by mouth at bedtime. Patient not taking: Reported on 06/27/2015 02/21/15   Cloria Spring, MD  omeprazole (PRILOSEC) 40 MG capsule Take 1 capsule (40 mg total) by mouth daily. Patient taking differently: Take 40 mg by mouth daily as needed (acid reflux).  03/19/15   Estill Dooms, NP  predniSONE (DELTASONE) 10 MG tablet 5,4,3,2,1 - take with food Patient taking differently: as directed. 5,4,3,2,1 - take with food 02/07/15   Lily Kocher, PA-C   BP 149/72 mmHg  Pulse 83  Temp(Src) 98.4 F (36.9 C) (Oral)  Resp 16  Ht 5\' 6"  (1.676 m)  Wt 140 lb (63.504 kg)  BMI 22.61 kg/m2  SpO2 99%  LMP 06/04/2015 Physical Exam  Constitutional: She is oriented to person, place, and time. She appears well-developed and well-nourished. No distress.  HENT:  Head: Normocephalic and atraumatic.  Neck: Normal range of motion. Neck supple.  Cardiovascular: Normal rate, regular rhythm, normal heart sounds and intact distal pulses.   No murmur heard. Pulmonary/Chest: Effort normal and breath sounds normal. No respiratory distress.  Abdominal: Soft. She exhibits no distension. There is no tenderness. There is no rebound and no guarding.  Musculoskeletal: She exhibits tenderness. She exhibits no edema.       Lumbar back: She exhibits tenderness and pain. She exhibits normal range of motion, no swelling, no deformity, no laceration and normal pulse.  Diffuse ttp of the lower lumbar spine and left paraspinal muscles.  Back pain reproduced with SLR on left. DP pulses are brisk and symmetrical.  Distal sensation intact.  Hip Flexors/Extensors are intact.  Pt has 5/5 strength against resistance of bilateral lower extremities.      Neurological: She is alert and oriented to person, place, and time. She has normal strength. No sensory deficit. She exhibits normal muscle tone. Coordination and gait normal.  Reflex Scores:      Patellar reflexes are 2+ on the right side and 2+ on the left side.      Achilles reflexes are 2+ on the right side and 2+ on the left side. Skin: Skin is warm and dry. No rash noted.  Nursing note and vitals reviewed.   ED Course  Procedures (including critical care time) Labs Review Labs Reviewed  URINALYSIS, ROUTINE W REFLEX MICROSCOPIC (NOT AT [facility]) - Abnormal; Notable for the following:    APPearance HAZY (*)    All other components within normal limits  POC URINE PREG, ED    Imaging Review No results found. I have personally reviewed and evaluated  these images and lab results as part of my medical decision-making.   EKG Interpretation None      MDM   Final diagnoses:  Lumbar strain, initial encounter    Pt reviewed on the [place] narcotic database. No recent Rx's filed since July  Pt is ambulatory with a steady gait.  No focal neuro deficits.  No concerning sx's for emergent neurological or infectious process.  Likely strain.  No hx of trauma reported to indicate need for imaging.  She agrees to symptomatic tx and close PMD f/u if needed.  Will also give referral info for [facility].  Vitals stable, pt is well appearing.  Stable for d/c     Kem Parkinson, PA-C 06/27/15 1725  Dorie Rank, MD 06/28/15 [phone]

## 2015-06-27 NOTE — ED Notes (Signed)
Injury to left lower back, rates pain 6/10.

## 2015-07-02 ENCOUNTER — Ambulatory Visit (INDEPENDENT_AMBULATORY_CARE_PROVIDER_SITE_OTHER): Payer: Medicare Other

## 2015-07-02 ENCOUNTER — Ambulatory Visit (INDEPENDENT_AMBULATORY_CARE_PROVIDER_SITE_OTHER): Payer: Medicare Other | Admitting: Orthopedic Surgery

## 2015-07-02 VITALS — BP 141/90 | Ht 66.0 in | Wt 140.0 lb

## 2015-07-02 DIAGNOSIS — S5292XD Unspecified fracture of left forearm, subsequent encounter for closed fracture with routine healing: Secondary | ICD-10-CM

## 2015-07-02 NOTE — Progress Notes (Signed)
Patient ID: [name], female   DOB: 10-28-1970, 44 y.o.   MRN: [id number]  ESTABLISHED PATIENT   Chief Complaint  Patient presents with  . Wound Check    left forearm and wrist pain, left forearm fx 10/28/14     [name] is a 44 y.o. female.   HPI 44 year old female had a left nightstick type forearm fracture in January 2016 was treated with closed methods including casting. On her last visit she had a normal x-ray with a healed fracture but continued to complain of left forearm pain. She was sent for physical therapy and was somewhat noncompliant.  Review of systems the patient says she feels a tightness in her wrist and stiffness. There are no neurologic complaints. Review of Systems See hpi  Past Medical History  Diagnosis Date  . Pregnancy   . SVD (spontaneous vaginal delivery)     x 1  . Headache(784.0)   . Fibromyalgia   . Arthritis     hands, knees    Past Surgical History  Procedure Laterality Date  . Cesarean section      x 4  . Wisdom tooth extraction    . Right thumb surgery      pins    Family History  Problem Relation Age of Onset  . Diabetes Other   . Heart failure Other   . Hypertension Other   . Cancer Other   . Hypertension Mother   . Diabetes Mother   . Cancer Mother     lung  . CAD Father   . Heart failure Father   . Heart disease Father   . Seizures Son     Social History Social History  Substance Use Topics  . Smoking status: Current Every Day Smoker -- 0.50 packs/day for 10 years    Types: Cigarettes  . Smokeless tobacco: Never Used  . Alcohol Use: No    Allergies  Allergen Reactions  . Aspirin Other (See Comments)    shortness of breath Pt cannot take alleve or ibuprofen  . Latex Swelling    Swelling at site of latex contact  . Phenergan [Promethazine]   . Penicillins Rash    Current Outpatient Prescriptions  Medication Sig Dispense Refill  . cyclobenzaprine (FLEXERIL) 10 MG tablet Take 1 tablet (10 mg total)  by mouth 3 (three) times daily as needed. 21 tablet 0  . gabapentin (NEURONTIN) 100 MG capsule Take 1 capsule (100 mg total) by mouth 3 (three) times daily. 90 capsule 5  . HYDROcodone-acetaminophen (NORCO/VICODIN) 5-325 MG per tablet Take one tab po q 4-6 hrs prn pain 10 tablet 0  . loratadine-pseudoephedrine (CLARITIN-D 12 HOUR) 5-120 MG per tablet Take 1 tablet by mouth 2 (two) times daily. (Patient not taking: Reported on 06/27/2015) 20 tablet 0  . omeprazole (PRILOSEC) 40 MG capsule Take 1 capsule (40 mg total) by mouth daily. (Patient taking differently: Take 40 mg by mouth daily as needed (acid reflux). ) 30 capsule 6  . [DISCONTINUED] albuterol (ACCUNEB) 1.25 MG/3ML nebulizer solution Take 3 mLs (1.25 mg total) by nebulization every 6 (six) hours as needed for wheezing. (Patient not taking: Reported on 06/27/2015) 75 mL 12  . [DISCONTINUED] mirtazapine (REMERON) 15 MG tablet Take 1 tablet (15 mg total) by mouth at bedtime. (Patient not taking: Reported on 06/27/2015) 30 tablet 2   No current facility-administered medications for this visit.       Physical Exam Blood pressure 141/90, height 5\' 6"  (1.676 m), weight  140 lb (63.504 kg), last menstrual period 06/04/2015. Physical Exam The patient is well developed well nourished and well groomed. Orientation to person place and time is normal  Mood is flat Ambulatory status is normal  Skin remains intact without laceration ulceration or erythema Gross motor exam is intact without atrophy. Muscle tone normal grade 5 motor strength Neurovascular exam remains intact Inspection there is no swelling but she has tenderness in the radial ulnar joint and distal radiocarpal joint  Her range of motion is normal except for pronation  No instability at the radial ulnar or radial carpal joint   Data Reviewed  I have independently reviewed the radiographs and my interpretation is:  Healed fracture left ulna with minimal angulation on AP x-ray  less than 5   Assessment  Pain left forearm after ulnar fracture. Chronic pain. At this point with a normal healing of the fracture and no significant angulation or x-ray abnormalities I have advised the patient that she will have to live with her symptoms as they are. I don't see any need for an osteotomy or any surgical intervention especially with her history of pain medicines and opiate management   Plan   Released from care

## 2015-07-27 ENCOUNTER — Encounter (HOSPITAL_COMMUNITY): Payer: Self-pay | Admitting: Emergency Medicine

## 2015-07-27 ENCOUNTER — Emergency Department (HOSPITAL_COMMUNITY)
Admission: EM | Admit: 2015-07-27 | Discharge: 2015-07-27 | Disposition: A | Discharge status: Home / Self Care | Payer: Medicare Other | Attending: Emergency Medicine | Admitting: Emergency Medicine

## 2015-07-27 ENCOUNTER — Emergency Department (HOSPITAL_COMMUNITY): Payer: Medicare Other

## 2015-07-27 DIAGNOSIS — R0981 Nasal congestion: Secondary | ICD-10-CM | POA: Diagnosis present

## 2015-07-27 DIAGNOSIS — M797 Fibromyalgia: Secondary | ICD-10-CM | POA: Diagnosis not present

## 2015-07-27 DIAGNOSIS — Z91040 Latex allergy status: Secondary | ICD-10-CM | POA: Diagnosis not present

## 2015-07-27 DIAGNOSIS — Z880 Allergy status to penicillin: Secondary | ICD-10-CM | POA: Diagnosis not present

## 2015-07-27 DIAGNOSIS — J209 Acute bronchitis, unspecified: Secondary | ICD-10-CM | POA: Diagnosis not present

## 2015-07-27 DIAGNOSIS — Z79899 Other long term (current) drug therapy: Secondary | ICD-10-CM | POA: Insufficient documentation

## 2015-07-27 DIAGNOSIS — R519 Headache, unspecified: Secondary | ICD-10-CM

## 2015-07-27 DIAGNOSIS — Z720 Tobacco use: Secondary | ICD-10-CM | POA: Diagnosis not present

## 2015-07-27 DIAGNOSIS — M1990 Unspecified osteoarthritis, unspecified site: Secondary | ICD-10-CM | POA: Insufficient documentation

## 2015-07-27 DIAGNOSIS — R51 Headache: Secondary | ICD-10-CM

## 2015-07-27 MED ORDER — IPRATROPIUM-ALBUTEROL 0.5-2.5 (3) MG/3ML IN SOLN
3.0000 mL | Freq: Once | RESPIRATORY_TRACT | Status: AC
  Administered 2015-07-27: 3 mL via RESPIRATORY_TRACT
  Filled 2015-07-27: qty 3

## 2015-07-27 MED ORDER — ALBUTEROL SULFATE HFA 108 (90 BASE) MCG/ACT IN AERS
2.0000 | INHALATION_SPRAY | RESPIRATORY_TRACT | Status: DC | PRN
  Administered 2015-07-27: 2 via RESPIRATORY_TRACT
  Filled 2015-07-27: qty 6.7

## 2015-07-27 MED ORDER — AZITHROMYCIN 250 MG PO TABS: ORAL_TABLET | ORAL | Status: DC

## 2015-07-27 MED ORDER — HYDROCODONE-ACETAMINOPHEN 5-325 MG PO TABS: 1.0000 | ORAL_TABLET | Freq: Four times a day (QID) | ORAL | Status: DC | PRN

## 2015-07-27 MED ORDER — AZITHROMYCIN 250 MG PO TABS
500.0000 mg | ORAL_TABLET | Freq: Once | ORAL | Status: AC
  Administered 2015-07-27: 500 mg via ORAL
  Filled 2015-07-27: qty 2

## 2015-07-27 MED ORDER — HYDROCOD POLST-CPM POLST ER 10-8 MG/5ML PO SUER
5.0000 mL | Freq: Once | ORAL | Status: AC
  Administered 2015-07-27: 5 mL via ORAL
  Filled 2015-07-27: qty 5

## 2015-07-27 MED ORDER — ONDANSETRON 4 MG PO TBDP
4.0000 mg | ORAL_TABLET | Freq: Once | ORAL | Status: AC
Start: 2015-07-27 — End: 2015-07-27
  Administered 2015-07-27: 4 mg via ORAL
  Filled 2015-07-27: qty 1

## 2015-07-27 MED ORDER — ACETAMINOPHEN 500 MG PO TABS
1000.0000 mg | ORAL_TABLET | Freq: Once | ORAL | Status: AC
  Administered 2015-07-27: 1000 mg via ORAL
  Filled 2015-07-27: qty 2

## 2015-07-27 MED ORDER — ALBUTEROL SULFATE (2.5 MG/3ML) 0.083% IN NEBU: 2.5000 mg | INHALATION_SOLUTION | Freq: Four times a day (QID) | RESPIRATORY_TRACT | Status: DC | PRN

## 2015-07-27 NOTE — Discharge Instructions (Signed)
Use the inhaler as directed. Do not drive while taking the medication for cough as it will make you sleepy. Return if your symptoms worse.   Acute Bronchitis Bronchitis is inflammation of the airways that extend from the windpipe into the lungs (bronchi). The inflammation often causes mucus to develop. This leads to a cough, which is the most common symptom of bronchitis.  In acute bronchitis, the condition usually develops suddenly and goes away over time, usually in a couple weeks. Smoking, allergies, and asthma can make bronchitis worse. Repeated episodes of bronchitis may cause further lung problems.  CAUSES Acute bronchitis is most often caused by the same virus that causes a cold. The virus can spread from person to person (contagious) through coughing, sneezing, and touching contaminated objects. SIGNS AND SYMPTOMS   Cough.   Fever.   Coughing up mucus.   Body aches.   Chest congestion.   Chills.   Shortness of breath.   Sore throat.  DIAGNOSIS  Acute bronchitis is usually diagnosed through a physical exam. Your health care provider will also ask you questions about your medical history. Tests, such as chest X-rays, are sometimes done to rule out other conditions.  TREATMENT  Acute bronchitis usually goes away in a couple weeks. Oftentimes, no medical treatment is necessary. Medicines are sometimes given for relief of fever or cough. Antibiotic medicines are usually not needed but may be prescribed in certain situations. In some cases, an inhaler may be recommended to help reduce shortness of breath and control the cough. A cool mist vaporizer may also be used to help thin bronchial secretions and make it easier to clear the chest.  HOME CARE INSTRUCTIONS  Get plenty of rest.   Drink enough fluids to keep your urine clear or pale yellow (unless you have a medical condition that requires fluid restriction). Increasing fluids may help thin your respiratory secretions  (sputum) and reduce chest congestion, and it will prevent dehydration.   Take medicines only as directed by your health care provider.  If you were prescribed an antibiotic medicine, finish it all even if you start to feel better.  Avoid smoking and secondhand smoke. Exposure to cigarette smoke or irritating chemicals will make bronchitis worse. If you are a smoker, consider using nicotine gum or skin patches to help control withdrawal symptoms. Quitting smoking will help your lungs heal faster.   Reduce the chances of another bout of acute bronchitis by washing your hands frequently, avoiding people with cold symptoms, and trying not to touch your hands to your mouth, nose, or eyes.   Keep all follow-up visits as directed by your health care provider.  SEEK MEDICAL CARE IF: Your symptoms do not improve after 1 week of treatment.  SEEK IMMEDIATE MEDICAL CARE IF:  You develop an increased fever or chills.   You have chest pain.   You have severe shortness of breath.  You have bloody sputum.   You develop dehydration.  You faint or repeatedly feel like you are going to pass out.  You develop repeated vomiting.  You develop a severe headache. MAKE SURE YOU:   Understand these instructions.  Will watch your condition.  Will get help right away if you are not doing well or get worse.   This information is not intended to replace advice given to you by your health care provider. Make sure you discuss any questions you have with your health care provider.   Document Released: 10/30/2004 Document Revised: 10/13/2014 Document Reviewed:  03/15/2013 Elsevier Interactive Patient Education [employer].

## 2015-07-27 NOTE — ED Provider Notes (Signed)
CSN: [id number]     Arrival date & time 07/27/15  1935 History   First MD Initiated Contact with Patient 07/27/15 2032     Chief Complaint  Patient presents with  . Nasal Congestion     (Consider location/radiation/quality/duration/timing/severity/associated sxs/prior Treatment) Patient is a 44 y.o. female presenting with URI. The history is provided by the patient.  URI Presenting symptoms: congestion, cough, facial pain, fever, rhinorrhea and sore throat   Presenting symptoms: no ear pain   Severity:  Moderate Duration:  1 week Timing:  Constant Progression:  Worsening Chronicity:  New Relieved by:  Nothing Worsened by:  Nothing tried Ineffective treatments:  OTC medications Associated symptoms: headaches, myalgias, sinus pain, sneezing and wheezing    Gerry Heaphy Gibas is a .44 y.o. female who present to the ED with cough, congestion, fever chills, sinus congestions. She states she has had green nasal drainage and pain to the area under her right eye. She has taken OTC medications without relief.   Past Medical History  Diagnosis Date  . Pregnancy   . SVD (spontaneous vaginal delivery)     x 1  . Headache(784.0)   . Fibromyalgia   . Arthritis     hands, knees   Past Surgical History  Procedure Laterality Date  . Cesarean section      x 4  . Wisdom tooth extraction    . Right thumb surgery      pins   Family History  Problem Relation Age of Onset  . Diabetes Other   . Heart failure Other   . Hypertension Other   . Cancer Other   . Hypertension Mother   . Diabetes Mother   . Cancer Mother     lung  . CAD Father   . Heart failure Father   . Heart disease Father   . Seizures Son    Social History  Substance Use Topics  . Smoking status: Current Every Day Smoker -- 0.50 packs/day for 10 years    Types: Cigarettes  . Smokeless tobacco: Never Used  . Alcohol Use: No   OB History    Gravida Para Term Preterm AB TAB SAB Ectopic Multiple Living   7 6 6  1  1    6      Review of Systems  Constitutional: Positive for fever and chills.  HENT: Positive for congestion, rhinorrhea, sinus pressure, sneezing and sore throat. Negative for ear pain and trouble swallowing.   Eyes: Negative for visual disturbance.  Respiratory: Positive for cough, shortness of breath and wheezing.   Cardiovascular: Negative for chest pain and palpitations.  Gastrointestinal: Negative for nausea, vomiting, abdominal pain and diarrhea.  Genitourinary: Negative for dysuria, frequency and decreased urine volume.  Musculoskeletal: Positive for myalgias.  Skin: Negative for rash.  Neurological: Positive for headaches. Negative for dizziness, syncope and weakness.  Psychiatric/Behavioral: Negative for confusion. The patient is not nervous/anxious.       Allergies  Aspirin; Latex; Phenergan; and Penicillins  Home Medications   Prior to Admission medications   Medication Sig Start Date End Date Taking? Authorizing Provider  albuterol (PROVENTIL) (2.5 MG/3ML) 0.083% nebulizer solution Take 3 mLs (2.5 mg total) by nebulization every 6 (six) hours as needed for wheezing or shortness of breath. 07/27/15   Asiel Chrostowski Bunnie Pion, NP  azithromycin (ZITHROMAX) 250 MG tablet Take one tablet PO daily 07/27/15   Babyboy Loya Bunnie Pion, NP  cyclobenzaprine (FLEXERIL) 10 MG tablet Take 1 tablet (10 mg total) by mouth 3 (  three) times daily as needed. 06/27/15   Tammy Triplett, PA-C  gabapentin (NEURONTIN) 100 MG capsule Take 1 capsule (100 mg total) by mouth 3 (three) times daily. 06/25/15   Carole Civil, MD  HYDROcodone-acetaminophen (NORCO/VICODIN) 5-325 MG tablet Take 1 tablet by mouth every 6 (six) hours as needed (for cough). 07/27/15   Kevis Qu Bunnie Pion, NP   BP 153/87 mmHg  Pulse 86  Temp(Src) 101.1 F (38.4 C) (Temporal)  Resp 18  Ht 5\' 6"  (1.676 m)  Wt 140 lb (63.504 kg)  BMI 22.61 kg/m2  SpO2 98%  LMP 07/05/2015 Physical Exam  Constitutional: She is oriented to person, place, and time. She  appears well-developed and well-nourished. No distress.  HENT:  Head: Normocephalic.  Right Ear: Tympanic membrane normal.  Left Ear: Tympanic membrane normal.  Nose: Mucosal edema, rhinorrhea and sinus tenderness present. Right sinus exhibits maxillary sinus tenderness.  Mouth/Throat: Uvula is midline and mucous membranes are normal. Posterior oropharyngeal erythema present.  Eyes: Conjunctivae and EOM are normal.  Neck: Normal range of motion. Neck supple.  Cardiovascular: Normal rate and regular rhythm.   Pulmonary/Chest: No respiratory distress. She has wheezes. She has rhonchi.  Abdominal: Soft. Bowel sounds are normal. There is no tenderness.  Musculoskeletal: Normal range of motion.  Lymphadenopathy:    She has no cervical adenopathy.  Neurological: She is alert and oriented to person, place, and time. No cranial nerve deficit.  Skin: Skin is warm and dry.  Psychiatric: She has a normal mood and affect. Her behavior is normal.  Nursing note and vitals reviewed.   ED Course  Procedures (including critical care time) Cxr, Duoneb,  Symptoms improved with neb treatment  Labs Review Labs Reviewed - No data to display  Imaging Review Dg Chest 2 View  07/27/2015  CLINICAL DATA:  Nasal and chest congestion. Cough and fever. Symptoms for 2 weeks. EXAM: CHEST  2 VIEW COMPARISON:  09/20/2014 FINDINGS: New central bronchial thickening from prior exam. The cardiomediastinal contours are normal. Pulmonary vasculature is normal. No consolidation, pleural effusion, or pneumothorax. No acute osseous abnormalities are seen. IMPRESSION: New central bronchial thickening suggesting bronchitis. No pneumonia. Electronically Signed   By: Jeb Levering M.D.   On: 07/27/2015 20:52    MDM  44 y.o. female with fever, chills, cough and congestion for the past week. Stable for d/c without respiratory distress, O2 SAT 98% on R/A and does not appear toxic. Will treat with Z-pak, albuterol inhaler,  hydrocodone for cough and pain and albuterol solution for her home neb. Discussed with the patient clinical and x-ray findigns and plan of care. All questioned fully answered. She will return if symptoms worsen.  She will take tylenol and ibuprofen as needed for fever.  Encouraged patient to stop smoking.  Final diagnoses:  Bronchitis, acute, with bronchospasm  Sinus headache       Ashley Murrain, NP 07/28/15 [place], DO 07/31/15 1920

## 2015-07-27 NOTE — ED Notes (Signed)
Nasal and chest congestion, facial pain, cough with green drainage, pt has been taking OTC medication without relief

## 2015-08-21 ENCOUNTER — Other Ambulatory Visit: Payer: Self-pay | Admitting: Obstetrics and Gynecology

## 2015-08-21 NOTE — Telephone Encounter (Signed)
ACYCLOVIR REFILLED X 6 MOS

## 2015-11-19 ENCOUNTER — Telehealth: Payer: Self-pay

## 2015-11-19 NOTE — Telephone Encounter (Signed)
Refill request from [place] for Ibuprofen 800mg , 1 every 8 hours prn, #90.   Please advise.

## 2015-11-19 NOTE — Telephone Encounter (Signed)
Denied, patient released from care in September

## 2015-12-10 ENCOUNTER — Encounter (HOSPITAL_COMMUNITY): Payer: Self-pay

## 2015-12-25 ENCOUNTER — Ambulatory Visit (INDEPENDENT_AMBULATORY_CARE_PROVIDER_SITE_OTHER): Payer: Medicare Other | Admitting: Advanced Practice Midwife

## 2015-12-25 ENCOUNTER — Encounter: Payer: Self-pay | Admitting: Advanced Practice Midwife

## 2015-12-25 ENCOUNTER — Other Ambulatory Visit: Payer: Medicare Other

## 2015-12-25 VITALS — BP 160/80 | Wt 195.0 lb

## 2015-12-25 DIAGNOSIS — R140 Abdominal distension (gaseous): Secondary | ICD-10-CM

## 2015-12-25 DIAGNOSIS — Z3202 Encounter for pregnancy test, result negative: Secondary | ICD-10-CM | POA: Diagnosis not present

## 2015-12-25 DIAGNOSIS — R102 Pelvic and perineal pain: Secondary | ICD-10-CM | POA: Diagnosis not present

## 2015-12-25 DIAGNOSIS — Z3200 Encounter for pregnancy test, result unknown: Secondary | ICD-10-CM

## 2015-12-25 DIAGNOSIS — N912 Amenorrhea, unspecified: Secondary | ICD-10-CM

## 2015-12-25 LAB — POCT URINE PREGNANCY: Preg Test, Ur: NEGATIVE

## 2015-12-25 NOTE — Progress Notes (Signed)
[facility] Visit  Patient name: [name] MRN [id number]  Date of birth: Jul 16, 1971  CC & HPI:  Flavia Mccarver Amato is a 45 y.o.  female presenting today for pregnancy symptoms  Her fiance was murdered at the end of December.  She says she has not had a period since September, and for a few months has been nauseated and belly is bloated "and no way it's fat."  Has some intermittent lower abdominal pain, as well.  Had [place] 2013 with last CS.  Denies vasomotor sx. She has gained 55lbs in 4 months.  Denies eating more, but admits to "cravings in the middle of the night".  BP has been borderline for a year or so, does not have a PCP.   Pertinent History Reviewed:  Medical & Surgical Hx:   Past Medical History  Diagnosis Date  . Pregnancy   . SVD (spontaneous vaginal delivery)     x 1  . Headache(784.0)   . Fibromyalgia   . Arthritis     hands, knees   Past Surgical History  Procedure Laterality Date  . Cesarean section      x 4  . Wisdom tooth extraction    . Right thumb surgery      pins   Family History  Problem Relation Age of Onset  . Diabetes Other   . Heart failure Other   . Hypertension Other   . Cancer Other   . Hypertension Mother   . Diabetes Mother   . Cancer Mother     lung  . CAD Father   . Heart failure Father   . Heart disease Father   . Seizures Son     Current outpatient prescriptions:  .  ibuprofen (ADVIL,MOTRIN) 800 MG tablet, , Disp: , Rfl:  .  [DISCONTINUED] mirtazapine (REMERON) 15 MG tablet, Take 1 tablet (15 mg total) by mouth at bedtime. (Patient not taking: Reported on 06/27/2015), Disp: 30 tablet, Rfl: 2 .  [DISCONTINUED] omeprazole (PRILOSEC) 40 MG capsule, Take 1 capsule (40 mg total) by mouth daily. (Patient taking differently: Take 40 mg by mouth daily as needed (acid reflux). ), Disp: 30 capsule, Rfl: 6 Social History: Reviewed -  reports that she has been smoking Cigarettes.  She has a 15 pack-year smoking history. She has never  used smokeless tobacco.  Review of Systems:   Constitutional: Negative for fever and chills Eyes: Negative for visual disturbances Respiratory: Negative for shortness of breath, dyspnea Cardiovascular: Negative for chest pain or palpitations  Gastrointestinal: Negative for diarrhea and constipation Genitourinary: Negative for dysuria and urgency, vaginal irritation or itching Musculoskeletal: Negative for back pain, joint pain, myalgias  Neurological: Negative for dizziness and headaches    Objective Findings:    Physical Examination: General appearance - well appearing, and in no distress Mental status - alert, oriented to person, place, and time Chest:  Normal respiratory effort Heart - normal rate and regular rhythm Abdomen:  Soft, nontender, increased girth Pelvic: Bedside US does not show a IUP.   Musculoskeletal:  Normal range of motion without pain Extremities:  No edema    Results for orders placed or performed in visit on 12/25/15 (from the past 24 hour(s))  POCT urine pregnancy   Collection Time: 12/25/15 10:30 AM  Result Value Ref Range   Preg Test, Ur Negative Negative      Assessment & Plan:  A:   Pseudocyesis ?  Weight gain/abdominal bloating  `amenorrhea, ? origin  Increased BP P:  SEE PCP on medicare Card about BP  Orders Placed This Encounter  Procedures  . US Pelvis Complete    Standing Status: Future     Number of Occurrences:      Standing Expiration Date: 02/23/2017    Order Specific Question:  Reason for exam:    Answer:  abdominal bloating, pelvic pain    Order Specific Question:  Preferred imaging location?    Answer:  Internal  . Follicle stimulating hormone  . TSH  . POCT urine pregnancy      Return pelvic US.  CRESENZO-DISHMAN,Anastasiya Gowin CNM 12/25/2015 12:04 PM

## 2015-12-26 LAB — TSH: TSH: 0.736 u[IU]/mL (ref 0.450–4.500)

## 2015-12-26 LAB — FOLLICLE STIMULATING HORMONE: FSH: 3.1 m[IU]/mL

## 2015-12-28 ENCOUNTER — Other Ambulatory Visit: Payer: Self-pay | Admitting: Advanced Practice Midwife

## 2015-12-28 ENCOUNTER — Other Ambulatory Visit: Payer: Medicare Other

## 2015-12-28 DIAGNOSIS — R140 Abdominal distension (gaseous): Secondary | ICD-10-CM

## 2015-12-28 DIAGNOSIS — R102 Pelvic and perineal pain: Secondary | ICD-10-CM

## 2015-12-28 DIAGNOSIS — N912 Amenorrhea, unspecified: Secondary | ICD-10-CM

## 2016-01-04 ENCOUNTER — Other Ambulatory Visit: Payer: Medicare Other

## 2016-01-28 ENCOUNTER — Encounter (HOSPITAL_COMMUNITY): Payer: Self-pay

## 2016-01-28 ENCOUNTER — Emergency Department (HOSPITAL_COMMUNITY)
Admission: EM | Admit: 2016-01-28 | Discharge: 2016-01-28 | Disposition: A | Discharge status: Home / Self Care | Payer: Medicare Other | Attending: Emergency Medicine | Admitting: Emergency Medicine

## 2016-01-28 DIAGNOSIS — M179 Osteoarthritis of knee, unspecified: Secondary | ICD-10-CM | POA: Diagnosis not present

## 2016-01-28 DIAGNOSIS — F17210 Nicotine dependence, cigarettes, uncomplicated: Secondary | ICD-10-CM | POA: Insufficient documentation

## 2016-01-28 DIAGNOSIS — J029 Acute pharyngitis, unspecified: Secondary | ICD-10-CM | POA: Diagnosis not present

## 2016-01-28 MED ORDER — LIDOCAINE VISCOUS 2 % MT SOLN
15.0000 mL | Freq: Once | OROMUCOSAL | Status: AC
  Administered 2016-01-28: 15 mL via OROMUCOSAL
  Filled 2016-01-28: qty 15

## 2016-01-28 MED ORDER — ACETAMINOPHEN 160 MG/5ML PO SOLN
500.0000 mg | Freq: Once | ORAL | Status: AC
  Administered 2016-01-28: 500 mg via ORAL
  Filled 2016-01-28: qty 20.3

## 2016-01-28 MED ORDER — ONDANSETRON 4 MG PO TBDP
4.0000 mg | ORAL_TABLET | Freq: Once | ORAL | Status: AC
  Administered 2016-01-28: 4 mg via ORAL
  Filled 2016-01-28: qty 1

## 2016-01-28 MED ORDER — DEXAMETHASONE 4 MG PO TABS: 4.0000 mg | ORAL_TABLET | Freq: Two times a day (BID) | ORAL | Status: DC

## 2016-01-28 MED ORDER — PREDNISONE 20 MG PO TABS
40.0000 mg | ORAL_TABLET | Freq: Once | ORAL | Status: AC
  Administered 2016-01-28: 40 mg via ORAL
  Filled 2016-01-28: qty 2

## 2016-01-28 NOTE — ED Provider Notes (Signed)
CSN: [id number]     Arrival date & time 01/28/16  1544 History  By signing my name below, I, Eustaquio Maize, attest that this documentation has been prepared under the direction and in the presence of Lily Kocher, PA-C.  Electronically Signed: Eustaquio Maize, ED Scribe. 01/28/2016. 4:45 PM.   Chief Complaint  Patient presents with  . Sore Throat   The history is provided by the patient. No language interpreter was used.     HPI Comments: [name] is a 45 y.o. female who presents to the Emergency Department complaining of gradual onset, constant, moderate, sore throat that began this morning. Pt also complains of a fever, body aches, and a headache. She has been using throat lozenges with some relief. Denies any other associated symptoms.   Past Medical History  Diagnosis Date  . Pregnancy   . SVD (spontaneous vaginal delivery)     x 1  . Headache(784.0)   . Fibromyalgia   . Arthritis     hands, knees   Past Surgical History  Procedure Laterality Date  . Cesarean section      x 4  . Wisdom tooth extraction    . Right thumb surgery      pins   Family History  Problem Relation Age of Onset  . Diabetes Other   . Heart failure Other   . Hypertension Other   . Cancer Other   . Hypertension Mother   . Diabetes Mother   . Cancer Mother     lung  . CAD Father   . Heart failure Father   . Heart disease Father   . Seizures Son    Social History  Substance Use Topics  . Smoking status: Current Every Day Smoker -- 1.00 packs/day for 10 years    Types: Cigarettes  . Smokeless tobacco: Never Used  . Alcohol Use: No   OB History    Gravida Para Term Preterm AB TAB SAB Ectopic Multiple Living   7 6 6  1  1   6      Review of Systems  Constitutional: Positive for fever.  HENT: Positive for sore throat.   Musculoskeletal: Positive for myalgias.  Neurological: Positive for headaches.  All other systems reviewed and are negative.  Allergies  Aspirin; Latex;  Phenergan; and Penicillins  Home Medications   Prior to Admission medications   Medication Sig Start Date End Date Taking? Authorizing Provider  ibuprofen (ADVIL,MOTRIN) 800 MG tablet  09/24/15   Historical Provider, MD   BP 143/87 mmHg  Pulse 101  Temp(Src) 99.3 F (37.4 C) (Oral)  Resp 20  Ht 5\' 6"  (1.676 m)  Wt 180 lb (81.647 kg)  BMI 29.07 kg/m2  SpO2 100%  LMP 12/28/2015   Physical Exam  Constitutional: She is oriented to person, place, and time. She appears well-developed and well-nourished. No distress.  HENT:  Head: Normocephalic and atraumatic.  Mouth/Throat: Posterior oropharyngeal erythema present.  Mild to moderate increased redness to the posterior oropharynx. Uvula is enlarged.  Nasal congestion present.   Eyes: Conjunctivae and EOM are normal.  Neck: Neck supple. No tracheal deviation present.  No cervical lymphadenopathy  Cardiovascular: Normal rate.   Pulmonary/Chest: Effort normal and breath sounds normal. No respiratory distress. She has no wheezes. She has no rales.  Lungs clear  Musculoskeletal: Normal range of motion. She exhibits no edema.  Capillary refill < 2 seconds.  Lymphadenopathy:    She has no cervical adenopathy.  Neurological: She is alert  and oriented to person, place, and time.  Skin: Skin is warm and dry.  Psychiatric: She has a normal mood and affect. Her behavior is normal.  Nursing note and vitals reviewed.   ED Course  Procedures (including critical care time)  DIAGNOSTIC STUDIES: Oxygen Saturation is 100% on RA, normal by my interpretation.    COORDINATION OF CARE: 4:39 PM-Discussed treatment plan with pt at bedside and pt agreed to plan.   Labs Review Labs Reviewed - No data to display  Imaging Review No results found.    EKG Interpretation None      MDM Vital signs reviewed. The examination favors pharyngitis and upper respiratory infection. The airway is patent. No acute distress.  I discussed with the  patient the use of salt water gargles and Chloraseptic Spray. The patient will use Tylenol every 4 hours for fever and for pain.    Final diagnoses:  None    **I personally performed the services described in this documentation, which was scribed in my presence. The recorded information has been reviewed and is accurate.I have reviewed nursing notes, vital signs, and all appropriate lab and imaging results for this patient.     Lily Kocher, PA-C 01/28/16 [place], MD 01/29/16 [phone]

## 2016-01-28 NOTE — Discharge Instructions (Signed)
Please use saltwater gargles and Chloraseptic Spray 3 or 4 times daily for your throat. Use Tylenol every 4 hours for fever and or aching. Use Decadron 2 times daily with food. Wash hands frequently, and increase fluids. Pharyngitis Pharyngitis is a sore throat (pharynx). There is redness, pain, and swelling of your throat. HOME CARE   Drink enough fluids to keep your pee (urine) clear or pale yellow.  Only take medicine as told by your doctor.  You may get sick again if you do not take medicine as told. Finish your medicines, even if you start to feel better.  Do not take aspirin.  Rest.  Rinse your mouth (gargle) with salt water ( tsp of salt per 1 qt of water) every 1-2 hours. This will help the pain.  If you are not at risk for choking, you can suck on hard candy or sore throat lozenges. GET HELP IF:  You have large, tender lumps on your neck.  You have a rash.  You cough up green, yellow-brown, or bloody spit. GET HELP RIGHT AWAY IF:   You have a stiff neck.  You drool or cannot swallow liquids.  You throw up (vomit) or are not able to keep medicine or liquids down.  You have very bad pain that does not go away with medicine.  You have problems breathing (not from a stuffy nose). MAKE SURE YOU:   Understand these instructions.  Will watch your condition.  Will get help right away if you are not doing well or get worse.   This information is not intended to replace advice given to you by your health care provider. Make sure you discuss any questions you have with your health care provider.   Document Released: 03/10/2008 Document Revised: 07/13/2013 Document Reviewed: 05/30/2013 Elsevier Interactive Patient Education 2016 Elsevier Inc.  Upper Respiratory Infection, Adult Most upper respiratory infections (URIs) are caused by a virus. A URI affects the nose, throat, and upper air passages. The most common type of URI is often called "the common cold." HOME CARE    Take medicines only as told by your doctor.  Gargle warm saltwater or take cough drops to comfort your throat as told by your doctor.  Use a warm mist humidifier or inhale steam from a shower to increase air moisture. This may make it easier to breathe.  Drink enough fluid to keep your pee (urine) clear or pale yellow.  Eat soups and other clear broths.  Have a healthy diet.  Rest as needed.  Go back to work when your fever is gone or your doctor says it is okay.  You may need to stay home longer to avoid giving your URI to others.  You can also wear a face mask and wash your hands often to prevent spread of the virus.  Use your inhaler more if you have asthma.  Do not use any tobacco products, including cigarettes, chewing tobacco, or electronic cigarettes. If you need help quitting, ask your doctor. GET HELP IF:  You are getting worse, not better.  Your symptoms are not helped by medicine.  You have chills.  You are getting more short of breath.  You have brown or red mucus.  You have yellow or brown discharge from your nose.  You have pain in your face, especially when you bend forward.  You have a fever.  You have puffy (swollen) neck glands.  You have pain while swallowing.  You have white areas in the back  of your throat. GET HELP RIGHT AWAY IF:   You have very bad or constant:  Headache.  Ear pain.  Pain in your forehead, behind your eyes, and over your cheekbones (sinus pain).  Chest pain.  You have long-lasting (chronic) lung disease and any of the following:  Wheezing.  Long-lasting cough.  Coughing up blood.  A change in your usual mucus.  You have a stiff neck.  You have changes in your:  Vision.  Hearing.  Thinking.  Mood. MAKE SURE YOU:   Understand these instructions.  Will watch your condition.  Will get help right away if you are not doing well or get worse.   This information is not intended to replace advice  given to you by your health care provider. Make sure you discuss any questions you have with your health care provider.   Document Released: 03/10/2008 Document Revised: 02/06/2015 Document Reviewed: 12/28/2013 Elsevier Interactive Patient Education [employer].

## 2016-01-28 NOTE — ED Notes (Signed)
Sore throat, fever, and body aches started today.

## 2016-06-25 ENCOUNTER — Encounter (HOSPITAL_COMMUNITY): Payer: Self-pay | Admitting: Emergency Medicine

## 2016-06-25 ENCOUNTER — Emergency Department (HOSPITAL_COMMUNITY): Payer: Medicare Other

## 2016-06-25 ENCOUNTER — Emergency Department (HOSPITAL_COMMUNITY)
Admission: EM | Admit: 2016-06-25 | Discharge: 2016-06-26 | Disposition: A | Discharge status: Home / Self Care | Payer: Medicare Other | Attending: Dermatology | Admitting: Dermatology

## 2016-06-25 DIAGNOSIS — Z5321 Procedure and treatment not carried out due to patient leaving prior to being seen by health care provider: Secondary | ICD-10-CM | POA: Diagnosis not present

## 2016-06-25 DIAGNOSIS — F17210 Nicotine dependence, cigarettes, uncomplicated: Secondary | ICD-10-CM | POA: Insufficient documentation

## 2016-06-25 DIAGNOSIS — R0602 Shortness of breath: Secondary | ICD-10-CM | POA: Insufficient documentation

## 2016-06-25 DIAGNOSIS — R55 Syncope and collapse: Secondary | ICD-10-CM | POA: Insufficient documentation

## 2016-06-25 DIAGNOSIS — R52 Pain, unspecified: Secondary | ICD-10-CM

## 2016-06-25 NOTE — ED Notes (Signed)
Patient wanted to leave to go home.Marland KitchenMarland KitchenTried to talk patient into staying... Patient stayed she needed to get home she had kids to get too.Marland Kitchen

## 2016-06-25 NOTE — ED Triage Notes (Signed)
PT HAS A KNIFE IN HER BRA.

## 2016-06-25 NOTE — ED Triage Notes (Signed)
Pt reports fall down steps 8-10 steps at apartment yesterday, landed on concrete, states landed on Hip. Reports lightheadedness prior to fall. Near LOC. States had blood [facility] at [facility] department and states hasn't felt right since.

## 2016-06-25 NOTE — ED Notes (Signed)
Disregard previous entries, entered in error. Pt called for triage x1 no answer.

## 2016-06-25 NOTE — ED Triage Notes (Signed)
Disregard previous note, entered in error

## 2016-06-25 NOTE — ED Triage Notes (Deleted)
Pt reports fall while picking tomatoes today, states she hit her head, no LOC. Deformity noted to R hand, bruising noted. Denies head pain/neck pain.

## 2016-06-26 ENCOUNTER — Encounter (HOSPITAL_COMMUNITY): Payer: Self-pay | Admitting: Cardiology

## 2016-06-26 ENCOUNTER — Emergency Department (HOSPITAL_COMMUNITY): Payer: Medicare Other

## 2016-06-26 ENCOUNTER — Emergency Department (HOSPITAL_COMMUNITY)
Admission: EM | Admit: 2016-06-26 | Discharge: 2016-06-26 | Disposition: A | Discharge status: Home / Self Care | Payer: Medicare Other | Attending: Emergency Medicine | Admitting: Emergency Medicine

## 2016-06-26 DIAGNOSIS — R079 Chest pain, unspecified: Secondary | ICD-10-CM | POA: Diagnosis not present

## 2016-06-26 DIAGNOSIS — F17210 Nicotine dependence, cigarettes, uncomplicated: Secondary | ICD-10-CM | POA: Insufficient documentation

## 2016-06-26 DIAGNOSIS — W109XXA Fall (on) (from) unspecified stairs and steps, initial encounter: Secondary | ICD-10-CM | POA: Insufficient documentation

## 2016-06-26 DIAGNOSIS — Y999 Unspecified external cause status: Secondary | ICD-10-CM | POA: Diagnosis not present

## 2016-06-26 DIAGNOSIS — Y92009 Unspecified place in unspecified non-institutional (private) residence as the place of occurrence of the external cause: Secondary | ICD-10-CM | POA: Diagnosis not present

## 2016-06-26 DIAGNOSIS — M25552 Pain in left hip: Secondary | ICD-10-CM | POA: Diagnosis present

## 2016-06-26 DIAGNOSIS — R42 Dizziness and giddiness: Secondary | ICD-10-CM | POA: Diagnosis not present

## 2016-06-26 DIAGNOSIS — W19XXXA Unspecified fall, initial encounter: Secondary | ICD-10-CM

## 2016-06-26 DIAGNOSIS — Y939 Activity, unspecified: Secondary | ICD-10-CM | POA: Diagnosis not present

## 2016-06-26 LAB — I-STAT CHEM 8, ED
BUN: 9 mg/dL (ref 6–20)
CHLORIDE: 104 mmol/L (ref 101–111)
CREATININE: 0.8 mg/dL (ref 0.44–1.00)
Calcium, Ion: 1.14 mmol/L — ABNORMAL LOW (ref 1.15–1.40)
Glucose, Bld: 110 mg/dL — ABNORMAL HIGH (ref 65–99)
HEMATOCRIT: 40 % (ref 36.0–46.0)
Hemoglobin: 13.6 g/dL (ref 12.0–15.0)
Potassium: 3.5 mmol/L (ref 3.5–5.1)
SODIUM: 142 mmol/L (ref 135–145)
TCO2: 27 mmol/L (ref 0–100)

## 2016-06-26 LAB — URINE MICROSCOPIC-ADD ON

## 2016-06-26 LAB — URINALYSIS, ROUTINE W REFLEX MICROSCOPIC
BILIRUBIN URINE: NEGATIVE
Glucose, UA: NEGATIVE mg/dL
Ketones, ur: NEGATIVE mg/dL
NITRITE: NEGATIVE
PH: 5.5 (ref 5.0–8.0)
Protein, ur: 30 mg/dL — AB
SPECIFIC GRAVITY, URINE: 1.025 (ref 1.005–1.030)

## 2016-06-26 LAB — POC URINE PREG, ED: Preg Test, Ur: NEGATIVE

## 2016-06-26 MED ORDER — CLINDAMYCIN HCL 300 MG PO CAPS: 300.0000 mg | ORAL_CAPSULE | Freq: Four times a day (QID) | ORAL | 0 refills | Status: DC

## 2016-06-26 MED ORDER — OXYCODONE-ACETAMINOPHEN 5-325 MG PO TABS: 1.0000 | ORAL_TABLET | ORAL | 0 refills | Status: DC | PRN

## 2016-06-26 MED ORDER — SODIUM CHLORIDE 0.9 % IV BOLUS (SEPSIS)
1000.0000 mL | Freq: Once | INTRAVENOUS | Status: AC
  Administered 2016-06-26: 1000 mL via INTRAVENOUS

## 2016-06-26 MED ORDER — OXYCODONE-ACETAMINOPHEN 5-325 MG PO TABS
2.0000 | ORAL_TABLET | Freq: Once | ORAL | Status: AC
  Administered 2016-06-26: 2 via ORAL
  Filled 2016-06-26: qty 2

## 2016-06-26 NOTE — ED Provider Notes (Signed)
[facility] DEPT Provider Note   CSN: [id number] Arrival date & time: 06/26/16  1137  By signing my name below, I, [name], attest that this documentation has been prepared under the direction and in the presence of Merrily Pew, MD. Electronically Signed: [name], ED Scribe. 06/26/16. 1:12 PM.  History   Chief Complaint Chief Complaint  Patient presents with  . Fall   HPI Comments: [name] is a 45 y.o. female who presents to the Emergency Department complaining of constant left hip pain, left rib pain, and right elbow pain after a fall down the steps yesterday morning. Patient states she had been lightheaded since her visit to the health department where she had her blood drawn. Pt says she has been very thirsty and believes she may be dehydrated. She was at [facility] ED where she imaging studies completed but was not seen due to long wait times. She denies back pain.    The history is provided by the patient and a relative. No language interpreter was used.    Past Medical History:  Diagnosis Date  . Arthritis    hands, knees  . Fibromyalgia   . Headache(784.0)   . Pregnancy   . SVD (spontaneous vaginal delivery)    x 1    Patient Active Problem List   Diagnosis Date Noted  . Adjustment disorder with mixed anxiety and depressed mood 02/21/2015  . Dysmenorrhea 01/08/2015  . Dyspareunia 01/08/2015  . Uterus retroversion 01/08/2015  . Sterilization 06/25/2012    Past Surgical History:  Procedure Laterality Date  . CESAREAN SECTION     x 4  . right thumb surgery     pins  . WISDOM TOOTH EXTRACTION      OB History    Gravida Para Term Preterm AB Living   7 6 6   1 6    SAB TAB Ectopic Multiple Live Births   1       1       Home Medications    Prior to Admission medications   Medication Sig Start Date End Date Taking? Authorizing Provider  clindamycin (CLEOCIN) 300 MG capsule Take 1 capsule (300 mg total) by mouth 4 (four) times daily. X 7  days 06/26/16   Merrily Pew, MD  dexamethasone (DECADRON) 4 MG tablet Take 1 tablet (4 mg total) by mouth 2 (two) times daily with a meal. 01/28/16   Lily Kocher, PA-C  ibuprofen (ADVIL,MOTRIN) 800 MG tablet  09/24/15   Historical Provider, MD  oxyCODONE-acetaminophen (PERCOCET) 5-325 MG tablet Take 1 tablet by mouth every 4 (four) hours as needed. 06/26/16   Merrily Pew, MD    Family History Family History  Problem Relation Age of Onset  . Hypertension Mother   . Diabetes Mother   . Cancer Mother     lung  . CAD Father   . Heart failure Father   . Heart disease Father   . Seizures Son   . Diabetes Other   . Heart failure Other   . Hypertension Other   . Cancer Other     Social History Social History  Substance Use Topics  . Smoking status: Current Every Day Smoker    Packs/day: 1.00    Years: 10.00    Types: Cigarettes  . Smokeless tobacco: Never Used  . Alcohol use No     Allergies   Aspirin; Latex; Phenergan [promethazine]; and Penicillins   Review of Systems Review of Systems  Musculoskeletal: Positive for arthralgias. Negative  for back pain.  Neurological: Positive for light-headedness.  All other systems reviewed and are negative.    Physical Exam Updated Vital Signs BP 121/75 (BP Location: Left Arm)   Pulse 76   Temp 98.5 F (36.9 C) (Oral)   Resp 20   Ht 5\' 6"  (1.676 m)   Wt 140 lb (63.5 kg)   LMP 06/24/2016 (Exact Date) Comment: tubalpt denies  SpO2 100%   BMI 22.60 kg/m   Physical Exam  Constitutional: She is oriented to person, place, and time. She appears well-developed and well-nourished.  HENT:  Head: Normocephalic and atraumatic.  No trauma to head or face  Neck:  No cervical spine tenderness  Cardiovascular: Normal rate.   Pulmonary/Chest: Effort normal.  Right chest tenderness with palpation. No tenderness with lateral compression  Abdominal: Soft. She exhibits no distension. There is no tenderness. There is no guarding.    Musculoskeletal:  TTP of the left anterior pelvis Significant tenderness to the right proximal humerus and elbow with swelling and erythema Left arm shows no deformity or tenderness with palpation or ROM Bilateral lower extremities shows no deformity or tenderness with palpation or ROM  Neurological: She is alert and oriented to person, place, and time. A cranial nerve deficit is present.  Skin: Skin is warm and dry.  Psychiatric: She has a normal mood and affect.  Nursing note and vitals reviewed.    ED Treatments / Results   DIAGNOSTIC STUDIES: Oxygen Saturation is 100% on RA, normal by my interpretation.    COORDINATION OF CARE: 12:53 PM Discussed treatment plan with pt at bedside and pt agreed to plan.   Labs (all labs ordered are listed, but only abnormal results are displayed) Labs Reviewed  URINALYSIS, ROUTINE W REFLEX MICROSCOPIC (NOT AT [facility]) - Abnormal; Notable for the following:       Result Value   Hgb urine dipstick LARGE (*)    Protein, ur 30 (*)    Leukocytes, UA TRACE (*)    All other components within normal limits  URINE MICROSCOPIC-ADD ON - Abnormal; Notable for the following:    Squamous Epithelial / LPF 6-30 (*)    Bacteria, UA FEW (*)    All other components within normal limits  I-STAT CHEM 8, ED - Abnormal; Notable for the following:    Glucose, Bld 110 (*)    Calcium, Ion 1.14 (*)    All other components within normal limits  POC URINE PREG, ED    EKG  EKG Interpretation None       Radiology Dg Chest 2 View  Result Date: 06/25/2016 CLINICAL DATA:  Pain. Patient states she fell down a flight of stairs EXAM: CHEST  2 VIEW COMPARISON:  07/27/2015 FINDINGS: The heart size and mediastinal contours are within normal limits. Both lungs are clear. The visualized skeletal structures are unremarkable. IMPRESSION: No active cardiopulmonary disease. Electronically Signed   By: Lucienne Capers M.D.   On: 06/25/2016 21:49   Ct Pelvis Wo  Contrast  Result Date: 06/26/2016 CLINICAL DATA:  Status post fall 2 days ago.  Left hip pain. EXAM: CT PELVIS WITHOUT CONTRAST TECHNIQUE: Multidetector CT imaging of the pelvis was performed following the standard protocol without intravenous contrast. COMPARISON:  None. FINDINGS: Bones/Joint/Cartilage No fracture or dislocation. Normal alignment. No joint effusion. Mild bilateral facet arthropathy at L4-5 and L5-S1. No aggressive lytic or sclerotic osseous lesion. No periosteal reaction or bone destruction. Mild degenerative changes of the pubic symphysis. Mild osteoarthritis of bilateral sacroiliac joints.  Muscles and Tendons Muscles are normal.  No intramuscular hematoma or fluid collection. Soft tissue No fluid collection or hematoma. No soft tissue mass. No pelvic free fluid. No adnexal mass. IMPRESSION: No acute osseous injury of the pelvis. Electronically Signed   By: Kathreen Devoid   On: 06/26/2016 14:02   Dg Humerus Right  Result Date: 06/25/2016 CLINICAL DATA:  Pain. Patient states she fell down a flight of stairs EXAM: RIGHT HUMERUS - 2+ VIEW COMPARISON:  None. FINDINGS: There is no evidence of fracture or other focal bone lesions. Soft tissues are unremarkable. IMPRESSION: Negative. Electronically Signed   By: Lucienne Capers M.D.   On: 06/25/2016 21:50   Dg Hip Unilat With Pelvis 2-3 Views Left  Result Date: 06/25/2016 CLINICAL DATA:  Patient fell down flight of stairs.  Pain. EXAM: DG HIP (WITH OR WITHOUT PELVIS) 2-3V LEFT COMPARISON:  None. FINDINGS: There is no evidence of hip fracture or dislocation. There is no evidence of arthropathy or other focal bone abnormality. IMPRESSION: Negative. Electronically Signed   By: Lucienne Capers M.D.   On: 06/25/2016 21:48    Procedures Procedures (including critical care time)  Medications Ordered in ED Medications  sodium chloride 0.9 % bolus 1,000 mL (0 mLs Intravenous Stopped 06/26/16 1451)  oxyCODONE-acetaminophen (PERCOCET/ROXICET) 5-325  MG per tablet 2 tablet (2 tablets Oral Given 06/26/16 1316)     Initial Impression / Assessment and Plan / ED Course  I have reviewed the triage vital signs and the nursing notes.  Pertinent labs & imaging results that were available during my care of the patient were reviewed by me and considered in my medical decision making (see chart for details).  Clinical Course    No obvious fractures. Suspect possible occult rib fracture so we'll give short course of pain medicine. Also some erythema over her elbow where she hit it. She has a small healing abrasion there however, I suspect this may be an early infection versus normal inflammatory response to injury but will start her on a short course of antibiotics in case it does in fact end up as an infection.  Final Clinical Impressions(s) / ED Diagnoses   Final diagnoses:  Fall at home, initial encounter  Hip pain, left  Left sided chest pain    New Prescriptions New Prescriptions   CLINDAMYCIN (CLEOCIN) 300 MG CAPSULE    Take 1 capsule (300 mg total) by mouth 4 (four) times daily. X 7 days   OXYCODONE-ACETAMINOPHEN (PERCOCET) 5-325 MG TABLET    Take 1 tablet by mouth every 4 (four) hours as needed.   I personally performed the services described in this documentation, which was scribed in my presence. The recorded information has been reviewed and is accurate.      Merrily Pew, MD 06/26/16 [id number]

## 2016-06-26 NOTE — ED Triage Notes (Addendum)
Fell down 8-10 steps yesterday.  States she was felling light headed.  Pain left flank ,  Left hip and right arm.  [facility] and [facility] after a 5 hour wait.  States she did get x-rays.

## 2016-07-02 ENCOUNTER — Ambulatory Visit (INDEPENDENT_AMBULATORY_CARE_PROVIDER_SITE_OTHER): Payer: Medicare Other | Admitting: Orthopedic Surgery

## 2016-07-02 ENCOUNTER — Ambulatory Visit: Payer: Medicare Other

## 2016-07-02 ENCOUNTER — Encounter: Payer: Self-pay | Admitting: Orthopedic Surgery

## 2016-07-02 VITALS — BP 128/97 | HR 74 | Ht 67.0 in | Wt 178.0 lb

## 2016-07-02 DIAGNOSIS — M545 Low back pain, unspecified: Secondary | ICD-10-CM

## 2016-07-02 DIAGNOSIS — M25421 Effusion, right elbow: Secondary | ICD-10-CM

## 2016-07-02 NOTE — Progress Notes (Signed)
Chief Complaint  Patient presents with  . Elbow Problem    right elbow swelling   HPI  45 year old female with chronic pain followed by chronic pain management and [place] presents after falling down some stairs complaining of right elbow pain as well as left-sided hip pain  Pain localizes to the left lower back without radiation into the left leg and right elbow pain localizes over the olecranon. She says there was a small cut there when she fell she was placed on some clindamycin for prophylactic antibiotic  She is currently on Suboxone she says that gives her headache so she hasn't taken it but has a prescription at the data back  Review of Systems  Constitutional: Negative for chills and fever.  Musculoskeletal: Positive for back pain.  Skin: Negative.   Neurological: Negative for tingling and sensory change.    Past Medical History:  Diagnosis Date  . Arthritis    hands, knees  . Fibromyalgia   . Headache(784.0)   . Pregnancy   . SVD (spontaneous vaginal delivery)    x 1    Past Surgical History:  Procedure Laterality Date  . CESAREAN SECTION     x 4  . right thumb surgery     pins  . WISDOM TOOTH EXTRACTION     Family History  Problem Relation Age of Onset  . Hypertension Mother   . Diabetes Mother   . Cancer Mother     lung  . CAD Father   . Heart failure Father   . Heart disease Father   . Seizures Son   . Diabetes Other   . Heart failure Other   . Hypertension Other   . Cancer Other    Social History  Substance Use Topics  . Smoking status: Current Every Day Smoker    Packs/day: 1.00    Years: 10.00    Types: Cigarettes  . Smokeless tobacco: Never Used  . Alcohol use No   Current Meds  Medication Sig  . clindamycin (CLEOCIN) 300 MG capsule Take 1 capsule (300 mg total) by mouth 4 (four) times daily. X 7 days  . ibuprofen (ADVIL,MOTRIN) 800 MG tablet   . oxyCODONE-acetaminophen (PERCOCET) 5-325 MG tablet Take 1 tablet by mouth every 4 (four)  hours as needed.  . [DISCONTINUED] dexamethasone (DECADRON) 4 MG tablet Take 1 tablet (4 mg total) by mouth 2 (two) times daily with a meal.    BP (!) 128/97   Pulse 74   Ht 5\' 7"  (1.702 m)   Wt 178 lb (80.7 kg)   LMP 06/24/2016 (Exact Date)   BMI 27.88 kg/m   Physical Exam  Constitutional: She is oriented to person, place, and time. She appears well-developed and well-nourished. No distress.  Cardiovascular: Normal rate and intact distal pulses.   Neurological: She is alert and oriented to person, place, and time. She has normal reflexes. She exhibits normal muscle tone. Coordination normal.  Skin: Skin is warm and dry. No rash noted. She is not diaphoretic. No erythema. No pallor.  Psychiatric: She has a normal mood and affect. Her behavior is normal. Judgment and thought content normal.    Ortho Exam  Tenderness over the left lumbar spine left buttock. With normal range of motion in the left hip normal strength stability found on left hip examination.  Right elbow skin shows some mild erythema no warmth. Range of motion in the elbow is normal collateral ligaments of the elbow are stable she is tender to palpation  with crepitance over the right elbow. No open lacerations are noted there. She has normal pulse and perfusion of the right arm no lymphadenopathy normal sensation  Ribs are nontender   ASSESSMENT: My personal interpretation of the images:  I see several images pelvis and left hip normal Right humerus including elbow normal CT pelvis show L4-5 and L5-S1 facet arthritis  Encounter Diagnoses  Name Primary?  . Elbow swelling, right Yes  . Left-sided low back pain without sciatica         PLAN Injection right elbow for posttraumatic bursitis  Injection left hip IM injection for her lower back pain  She is not eligible for any opiate prescription she has a Suboxone prescription last 28 days  This was discussed with her  Recommend symptomatic measures for  back pain and elbow pain including warm soaks, Aleve.  A steroid injection was performed at left gluteal using 1% plain Lidocaine and 40 mg Depo-Medrol  No complications  Second injection right elbow  Right elbow bursitis  Verbal consent and timeout confirm site of injection which was prepared with alcohol and ethyl chloride and then 40 mg Depo-Medrol and lidocaine 1% injected  Arther Abbott, MD 07/02/2016 10:31 AM

## 2016-07-02 NOTE — Patient Instructions (Addendum)
Warm baths with epsom salts   Heat pad to back area   Aleve twice daily   You have received an injection of steroids into the joint. 15% of patients will have increased pain within the 24 hours postinjection.   This is transient and will go away.   We recommend that you use ice packs on the injection site for 20 minutes every 2 hours and extra strength Tylenol 2 tablets every 8 as needed until the pain resolves.  If you continue to have pain after taking the Tylenol and using the ice please call the office for further instructions.  Back Pain, Adult Back pain is very common in adults.The cause of back pain is rarely dangerous and the pain often gets better over time.The cause of your back pain may not be known. Some common causes of back pain include:  Strain of the muscles or ligaments supporting the spine.  Wear and tear (degeneration) of the spinal disks.  Arthritis.  Direct injury to the back. For many people, back pain may return. Since back pain is rarely dangerous, most people can learn to manage this condition on their own. HOME CARE INSTRUCTIONS Watch your back pain for any changes. The following actions may help to lessen any discomfort you are feeling:  Remain active. It is stressful on your back to sit or stand in one place for long periods of time. Do not sit, drive, or stand in one place for more than 30 minutes at a time. Take short walks on even surfaces as soon as you are able.Try to increase the length of time you walk each day.  Exercise regularly as directed by your health care provider. Exercise helps your back heal faster. It also helps avoid future injury by keeping your muscles strong and flexible.  Do not stay in bed.Resting more than 1-2 days can delay your recovery.  Pay attention to your body when you bend and lift. The most comfortable positions are those that put less stress on your recovering back. Always use proper lifting techniques,  including:  Bending your knees.  Keeping the load close to your body.  Avoiding twisting.  Find a comfortable position to sleep. Use a firm mattress and lie on your side with your knees slightly bent. If you lie on your back, put a pillow under your knees.  Avoid feeling anxious or stressed.Stress increases muscle tension and can worsen back pain.It is important to recognize when you are anxious or stressed and learn ways to manage it, such as with exercise.  Take medicines only as directed by your health care provider. Over-the-counter medicines to reduce pain and inflammation are often the most helpful.Your health care provider may prescribe muscle relaxant drugs.These medicines help dull your pain so you can more quickly return to your normal activities and healthy exercise.  Apply ice to the injured area:  Put ice in a plastic bag.  Place a towel between your skin and the bag.  Leave the ice on for 20 minutes, 2-3 times a day for the first 2-3 days. After that, ice and heat may be alternated to reduce pain and spasms.  Maintain a healthy weight. Excess weight puts extra stress on your back and makes it difficult to maintain good posture. SEEK MEDICAL CARE IF:  You have pain that is not relieved with rest or medicine.  You have increasing pain going down into the legs or buttocks.  You have pain that does not improve in one week.  You have night pain.  You lose weight.  You have a fever or chills. SEEK IMMEDIATE MEDICAL CARE IF:   You develop new bowel or bladder control problems.  You have unusual weakness or numbness in your arms or legs.  You develop nausea or vomiting.  You develop abdominal pain.  You feel faint.   This information is not intended to replace advice given to you by your health care provider. Make sure you discuss any questions you have with your health care provider.   Document Released: 09/22/2005 Document Revised: 10/13/2014 Document  Reviewed: 01/24/2014 Elsevier Interactive Patient Education [employer].

## 2016-09-17 IMAGING — CR DG CHEST 2V
2 series · 2 of 2 positions shown · non-contrast
Comparison: 09/13/2010

CLINICAL DATA: Cough, congestion and fever.  Entire chest pain.

EXAM:
CHEST  2 VIEW

[view not recorded (1 of 2)]
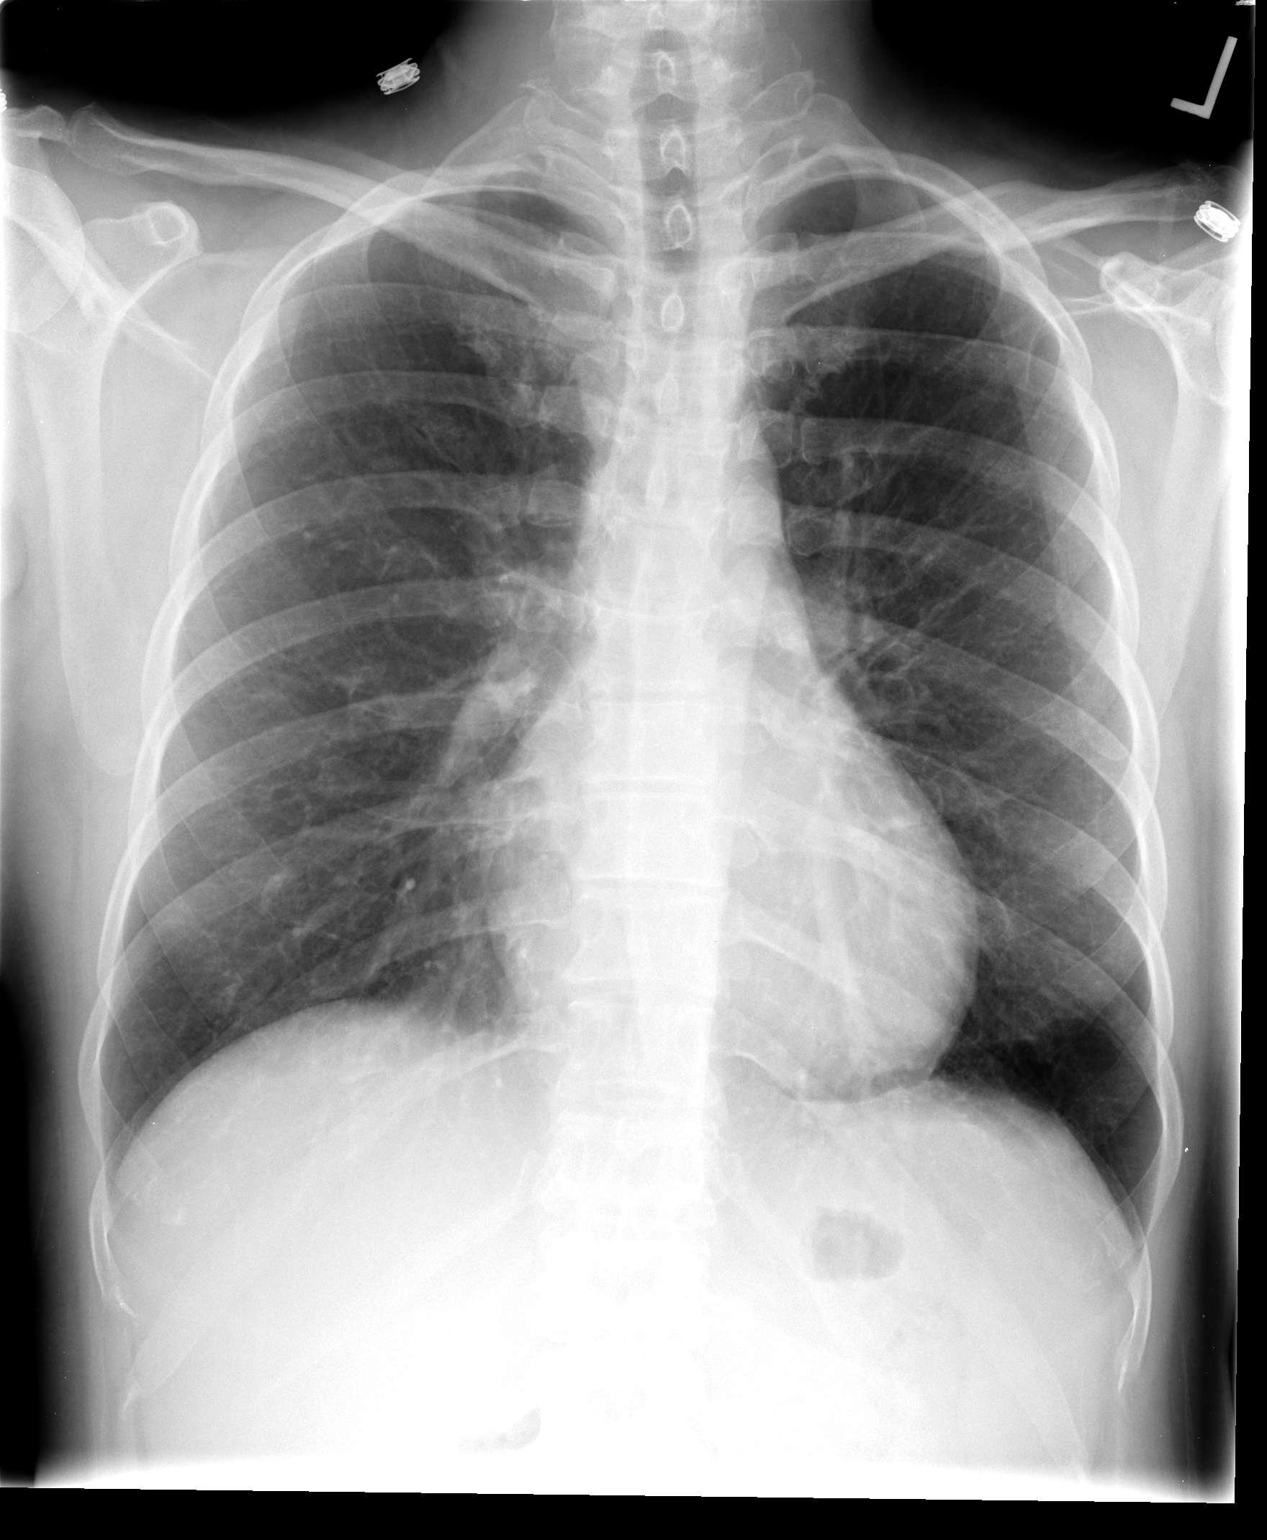

[view not recorded (2 of 2)]
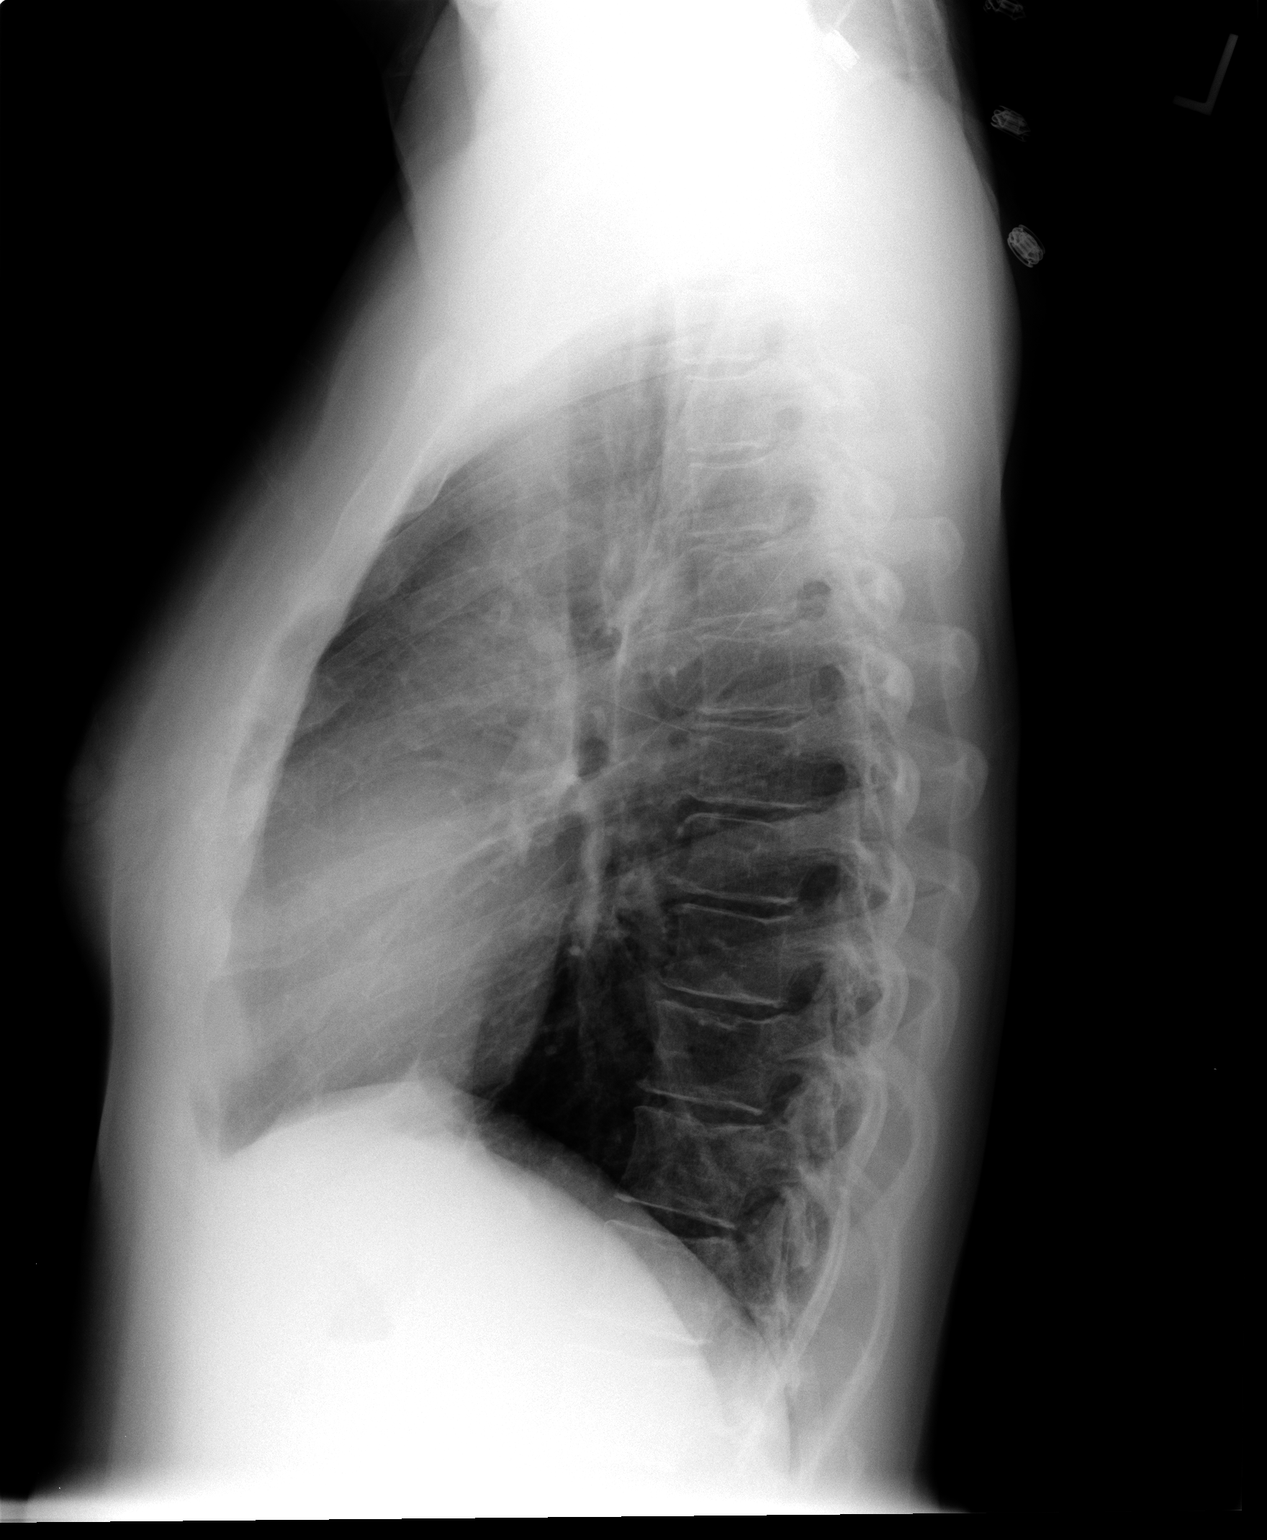

[2 of 2 positions shown; findings below may reference images not displayed]

FINDINGS: The heart size and mediastinal contours are within normal limits.
Both lungs are clear. The visualized skeletal structures are
unremarkable. Old left clavicle fracture.
IMPRESSION: No active cardiopulmonary disease.

## 2016-10-01 ENCOUNTER — Other Ambulatory Visit: Payer: Self-pay | Admitting: Adult Health

## 2016-10-01 ENCOUNTER — Other Ambulatory Visit: Payer: Self-pay | Admitting: Obstetrics and Gynecology

## 2016-10-04 NOTE — Telephone Encounter (Signed)
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 Pt with a list of other concerns ; advised to make appt and bring written list of concerns.

## 2016-10-25 IMAGING — DX DG FOREARM 2V*L*
2 series · 2 of 2 positions shown · non-contrast
Comparison: None.

CLINICAL DATA: Pt was playing with her children and fell off of her
bed onto her Lt forearm today around 11 am. Pt c/o severe pain in LT
forearm with distal swelling. Pt would not allow lateral positioning
for forearm views.

EXAM:
LEFT FOREARM - 2 VIEW

[forearm ap]
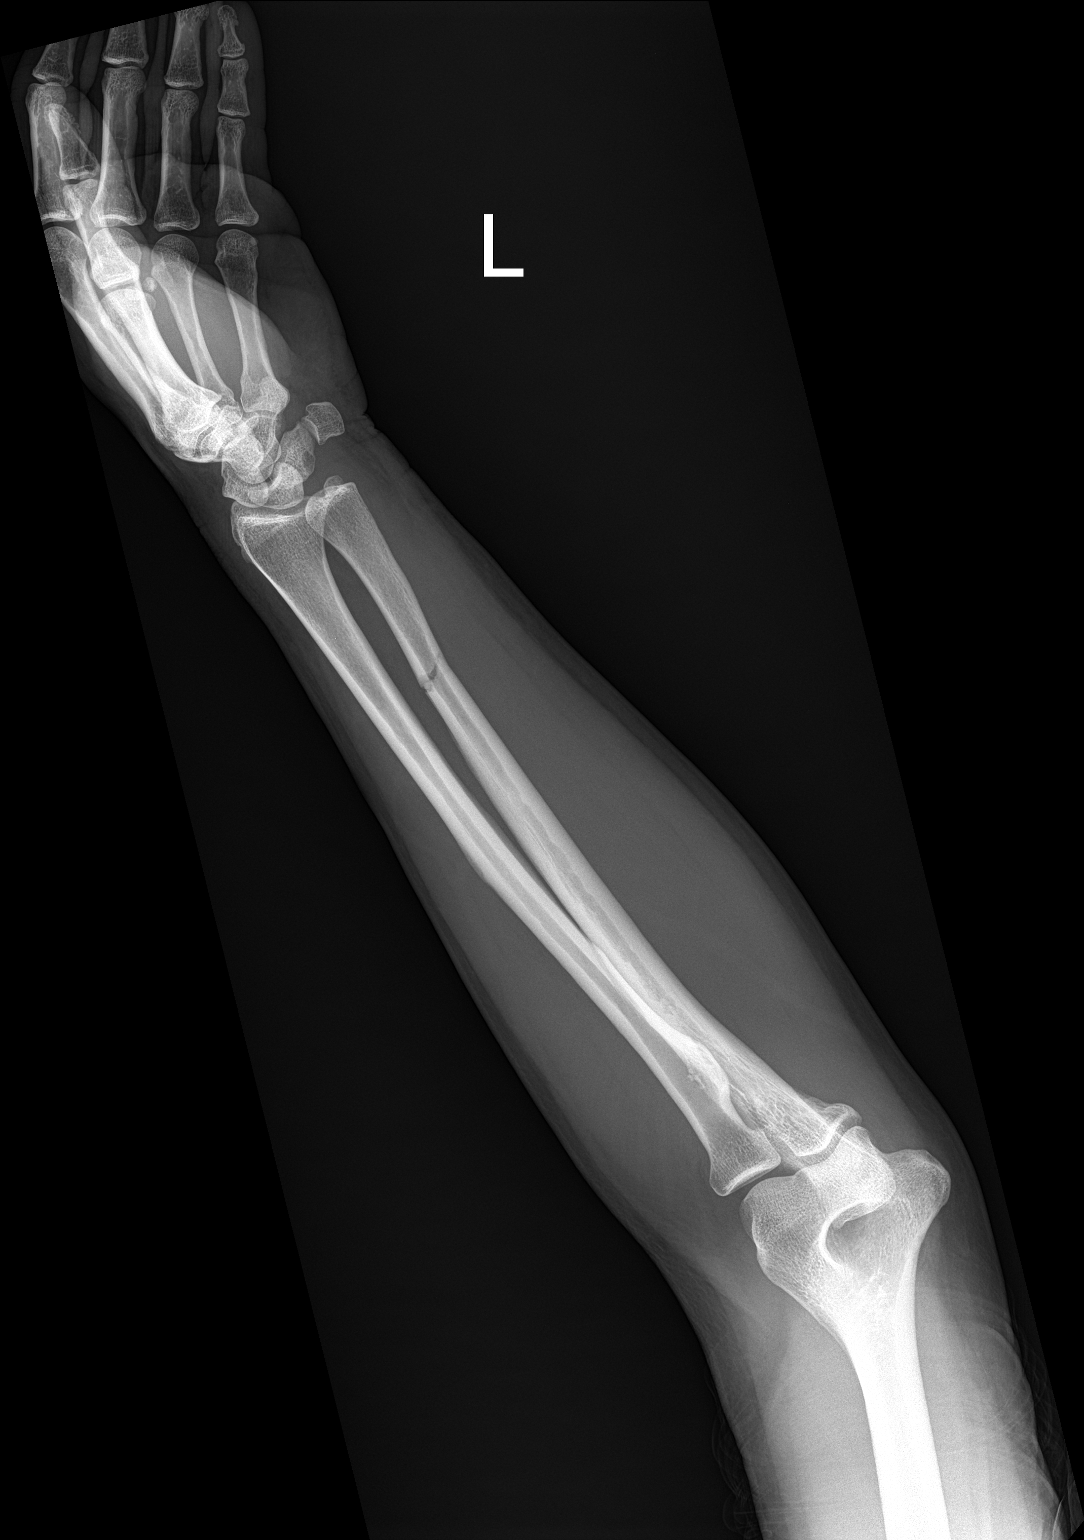

[forearm lat]
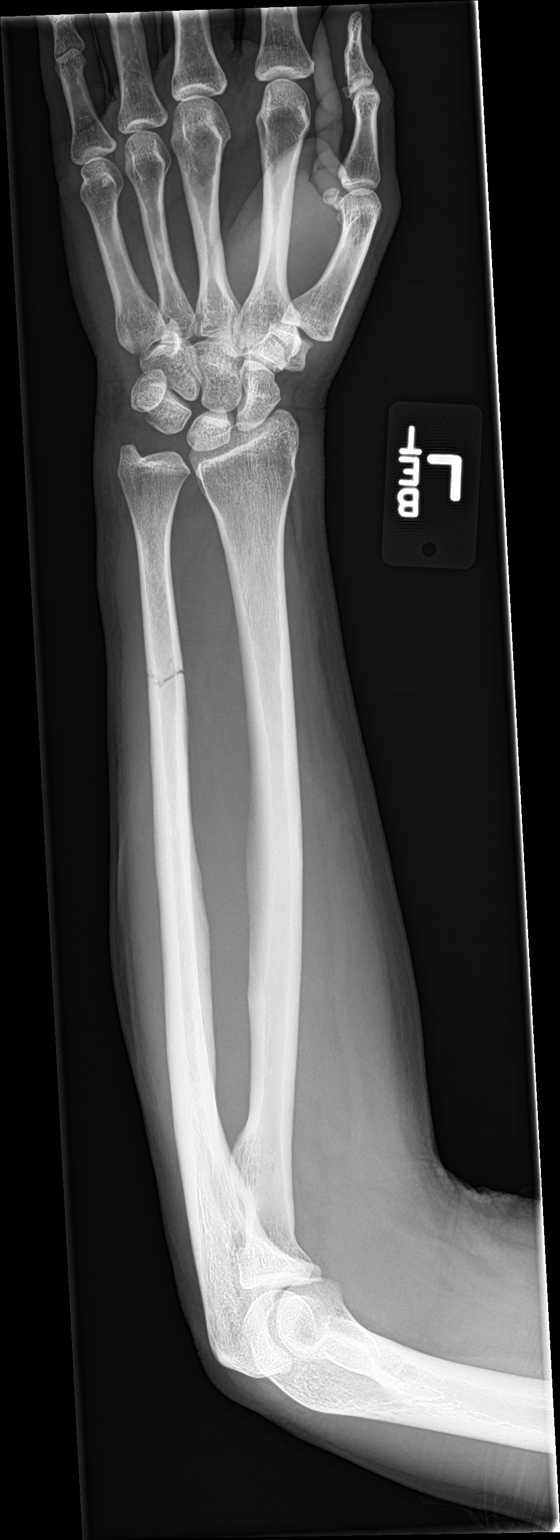

[2 of 2 positions shown; findings below may reference images not displayed]

FINDINGS: Transverse nondisplaced, nonangulated fracture of the distal ulnar
shaft. No other fractures. Elbow and wrist joints are normally
aligned. There is mild ulnar-sided forearm soft tissue swelling.
IMPRESSION: Nondisplaced fracture of the distal left ulnar shaft.

## 2017-03-08 ENCOUNTER — Emergency Department (HOSPITAL_COMMUNITY): Payer: Medicare Other

## 2017-03-08 ENCOUNTER — Emergency Department (HOSPITAL_COMMUNITY)
Admission: EM | Admit: 2017-03-08 | Discharge: 2017-03-08 | Disposition: A | Discharge status: Home / Self Care | Payer: Medicare Other | Attending: Emergency Medicine | Admitting: Emergency Medicine

## 2017-03-08 ENCOUNTER — Encounter (HOSPITAL_COMMUNITY): Payer: Self-pay | Admitting: Emergency Medicine

## 2017-03-08 DIAGNOSIS — R102 Pelvic and perineal pain: Secondary | ICD-10-CM

## 2017-03-08 DIAGNOSIS — N840 Polyp of corpus uteri: Secondary | ICD-10-CM | POA: Insufficient documentation

## 2017-03-08 DIAGNOSIS — Z79899 Other long term (current) drug therapy: Secondary | ICD-10-CM | POA: Diagnosis not present

## 2017-03-08 DIAGNOSIS — N939 Abnormal uterine and vaginal bleeding, unspecified: Secondary | ICD-10-CM

## 2017-03-08 DIAGNOSIS — N898 Other specified noninflammatory disorders of vagina: Secondary | ICD-10-CM | POA: Diagnosis not present

## 2017-03-08 DIAGNOSIS — F17210 Nicotine dependence, cigarettes, uncomplicated: Secondary | ICD-10-CM | POA: Insufficient documentation

## 2017-03-08 LAB — WET PREP, GENITAL
Clue Cells Wet Prep HPF POC: NONE SEEN
SPERM: NONE SEEN
TRICH WET PREP: NONE SEEN
YEAST WET PREP: NONE SEEN

## 2017-03-08 LAB — BASIC METABOLIC PANEL
ANION GAP: 7 (ref 5–15)
BUN: 9 mg/dL (ref 6–20)
CALCIUM: 9.3 mg/dL (ref 8.9–10.3)
CHLORIDE: 108 mmol/L (ref 101–111)
CO2: 29 mmol/L (ref 22–32)
CREATININE: 0.74 mg/dL (ref 0.44–1.00)
GFR calc non Af Amer: >60 mL/min (ref >60)
Glucose, Bld: 106 mg/dL — ABNORMAL HIGH (ref 65–99)
Potassium: 4 mmol/L (ref 3.5–5.1)
SODIUM: 144 mmol/L (ref 135–145)

## 2017-03-08 LAB — CBC WITH DIFFERENTIAL/PLATELET
BASOS PCT: 0 %
Basophils Absolute: 0 10*3/uL (ref 0.0–0.1)
EOS ABS: 0.1 10*3/uL (ref 0.0–0.7)
Eosinophils Relative: 2 %
HEMATOCRIT: 41.6 % (ref 36.0–46.0)
HEMOGLOBIN: 13.8 g/dL (ref 12.0–15.0)
Lymphocytes Relative: 30 %
Lymphs Abs: 2.2 10*3/uL (ref 0.7–4.0)
MCH: 29.6 pg (ref 26.0–34.0)
MCHC: 33.2 g/dL (ref 30.0–36.0)
MCV: 89.1 fL (ref 78.0–100.0)
Monocytes Absolute: 0.2 10*3/uL (ref 0.1–1.0)
Monocytes Relative: 3 %
NEUTROS ABS: 4.6 10*3/uL (ref 1.7–7.7)
NEUTROS PCT: 65 %
Platelets: 193 10*3/uL (ref 150–400)
RBC: 4.67 MIL/uL (ref 3.87–5.11)
RDW: 12.3 % (ref 11.5–15.5)
WBC: 7.2 10*3/uL (ref 4.0–10.5)

## 2017-03-08 LAB — HCG, QUANTITATIVE, PREGNANCY

## 2017-03-08 MED ORDER — KETOROLAC TROMETHAMINE 30 MG/ML IJ SOLN
30.0000 mg | Freq: Once | INTRAMUSCULAR | Status: AC
  Administered 2017-03-08: 30 mg via INTRAMUSCULAR
  Filled 2017-03-08: qty 1

## 2017-03-08 MED ORDER — MORPHINE SULFATE (PF) 4 MG/ML IV SOLN
4.0000 mg | Freq: Once | INTRAVENOUS | Status: AC
  Administered 2017-03-08: 4 mg via INTRAMUSCULAR

## 2017-03-08 MED ORDER — MORPHINE SULFATE (PF) 4 MG/ML IV SOLN
INTRAVENOUS | Status: AC
  Filled 2017-03-08: qty 1

## 2017-03-08 NOTE — ED Notes (Signed)
Pt states her back pain is better at this time.  Continues to c/o lower abdominal pain.

## 2017-03-08 NOTE — ED Provider Notes (Signed)
[facility] DEPT Provider Note   CSN: [id number] Arrival date & time: 03/08/17  1412     History   Chief Complaint Chief Complaint  Patient presents with  . Vaginal Bleeding    HPI [name] is a 46 y.o. female.  The history is provided by the patient.  Vaginal Bleeding  Primary symptoms include vaginal bleeding. There has been no fever. This is a new problem. The current episode started 6 to 12 hours ago. The problem occurs hourly. The problem has not changed since onset.The symptoms occur spontaneously. She is not pregnant. Her LMP was weeks ago. The discharge was normal. Associated symptoms include abdominal pain. Pertinent negatives include no abdominal swelling, no constipation, no diarrhea, no nausea, no vomiting and no frequency. She has tried NSAIDs for the symptoms. The treatment provided no relief. Sexual activity: sexually active. There is no concern regarding sexually transmitted diseases. She uses nothing for contraception.   46 year old female who presents with vaginal bleeding. This week earlier than when she expects her menses. This morning woke up with a puddle of blood, and some intermittent vaginal bleeding since then. With low abdominal pain, suprapubically in the left adnexa. No nausea, vomiting, abnormal vaginal discharge, fevers or chills, dysuria or urinary frequency. Past Medical History:  Diagnosis Date  . Arthritis    hands, knees  . Fibromyalgia   . Headache(784.0)   . Pregnancy   . SVD (spontaneous vaginal delivery)    x 1    Patient Active Problem List   Diagnosis Date Noted  . Adjustment disorder with mixed anxiety and depressed mood 02/21/2015  . Dysmenorrhea 01/08/2015  . Dyspareunia 01/08/2015  . Uterus retroversion 01/08/2015  . Sterilization 06/25/2012    Past Surgical History:  Procedure Laterality Date  . CESAREAN SECTION     x 4  . right thumb surgery     pins  . WISDOM TOOTH EXTRACTION      OB History    Gravida Para  Term Preterm AB Living   7 6 6   1 6    SAB TAB Ectopic Multiple Live Births   1       1       Home Medications    Prior to Admission medications   Medication Sig Start Date End Date Taking? Authorizing Provider  acetaminophen (TYLENOL) 500 MG tablet Take 1,000 mg by mouth every 6 (six) hours as needed for mild pain, moderate pain, fever or headache.   Yes [provider]    Family History Family History  Problem Relation Age of Onset  . Hypertension Mother   . Diabetes Mother   . Cancer Mother        lung  . CAD Father   . Heart failure Father   . Heart disease Father   . Seizures Son   . Diabetes Other   . Heart failure Other   . Hypertension Other   . Cancer Other     Social History Social History  Substance Use Topics  . Smoking status: Current Every Day Smoker    Packs/day: 1.00    Years: 10.00    Types: Cigarettes  . Smokeless tobacco: Never Used  . Alcohol use No     Allergies   Aspirin; Nsaids; Latex; Phenergan [promethazine]; and Penicillins   Review of Systems Review of Systems  Constitutional: Negative for fever.  Cardiovascular: Negative for chest pain.  Gastrointestinal: Positive for abdominal pain. Negative for constipation, diarrhea, nausea and vomiting.  Genitourinary:  Positive for vaginal bleeding. Negative for frequency.  Musculoskeletal: Positive for back pain.  Allergic/Immunologic: Negative for immunocompromised state.  Hematological: Does not bruise/bleed easily.  All other systems reviewed and are negative.    Physical Exam Updated Vital Signs BP 116/62   Pulse 60   Temp 98.1 F (36.7 C) (Oral)   Resp 16   Ht 5' 6.5" (1.689 m)   Wt 68 kg (150 lb)   LMP 02/17/2017   SpO2 100%   BMI 23.85 kg/m   Physical Exam Physical Exam  Nursing note and vitals reviewed. Constitutional: Well developed, well nourished, non-toxic, and in no acute distress Head: Normocephalic and atraumatic.  Mouth/Throat: Oropharynx is clear  and moist.  Neck: Normal range of motion. Neck supple.  Cardiovascular: Normal rate and regular rhythm.   Pulmonary/Chest: Effort normal and breath sounds normal.  Abdominal: Soft. There is suprapubic tenderness. There is no rebound and no guarding.  Pelvic: Normal external genitalia. Normal internal genitalia. No discharge.  Scant blood within the vagina. No significant active bleeding. No cervical motion tenderness. No adnexal masses. Left adnexal tenderness. Musculoskeletal: Normal range of motion.  Neurological: Alert, no facial droop, fluent speech, moves all extremities symmetrically Skin: Skin is warm and dry.  Psychiatric: Cooperative   ED Treatments / Results  Labs (all labs ordered are listed, but only abnormal results are displayed) Labs Reviewed  WET PREP, GENITAL - Abnormal; Notable for the following:       Result Value   WBC, Wet Prep HPF POC RARE (*)    All other components within normal limits  BASIC METABOLIC PANEL - Abnormal; Notable for the following:    Glucose, Bld 106 (*)    All other components within normal limits  CBC WITH DIFFERENTIAL/PLATELET  HCG, QUANTITATIVE, PREGNANCY  URINALYSIS, ROUTINE W REFLEX MICROSCOPIC  GC/CHLAMYDIA PROBE AMP ([place]) NOT AT [facility]    EKG  EKG Interpretation None       Radiology US Transvaginal Non-ob  Result Date: 03/08/2017 CLINICAL DATA:  Patient with lower abdominal pain for 1 day. Heavy vaginal bleeding. Evaluate for torsion. EXAM: TRANSABDOMINAL AND TRANSVAGINAL ULTRASOUND OF PELVIS DOPPLER ULTRASOUND OF OVARIES TECHNIQUE: Both transabdominal and transvaginal ultrasound examinations of the pelvis were performed. Transabdominal technique was performed for global imaging of the pelvis including uterus, ovaries, adnexal regions, and pelvic cul-de-sac. It was necessary to proceed with endovaginal exam following the transabdominal exam to visualize the endometrium and adnexal structures. Color and duplex Doppler  ultrasound was utilized to evaluate blood flow to the ovaries. COMPARISON:  CT abdomen pelvis 12/04/2016 FINDINGS: Uterus Measurements: 9.9 x 4.9 x 6.6 cm. No fibroids or other mass visualized. Endometrium Thickness: 4 mm. There is mobile echogenic material demonstrated within the endometrial canal. Additionally within the endometrial canal near the uterine fundus there is a 4 mm oval echogenic mass. Right ovary Measurements: 3.0 x 1.5 x 1.3 cm. Normal appearance/no adnexal mass. Left ovary Limited evaluation due to location within the pelvis and surrounding mobile bowel. Measurements: 2.7 x 1.6 x 2.0 cm. Normal appearance/no adnexal mass. Pulsed Doppler evaluation of both ovaries demonstrates normal low-resistance arterial and venous waveforms. Other findings No abnormal free fluid. IMPRESSION: Somewhat limited evaluation of the left ovary due to the location within the pelvis and extensive surrounding mobile bowel. Within the above limitation, there is no sonographic evidence to suggest torsion of either ovary. There is a 4 mm mass within the endometrium near the uterine fundus which may represent an endometrial polyp. Consider further  evaluation with sonohysterogram for confirmation prior to hysteroscopy. Endometrial sampling should also be considered if patient is at high risk for endometrial carcinoma. (Ref: Radiological Reasoning: Algorithmic Workup of Abnormal Vaginal Bleeding with Endovaginal Sonography and Sonohysterography. AJR 2008; 850:Y77-41) Additionally, the endometrial canal is distended with mobile echogenic material most compatible with blood products. Electronically Signed   By: Lovey Newcomer M.D.   On: 03/08/2017 19:52   US Pelvis Complete  Result Date: 03/08/2017 CLINICAL DATA:  Patient with lower abdominal pain for 1 day. Heavy vaginal bleeding. Evaluate for torsion. EXAM: TRANSABDOMINAL AND TRANSVAGINAL ULTRASOUND OF PELVIS DOPPLER ULTRASOUND OF OVARIES TECHNIQUE: Both transabdominal and  transvaginal ultrasound examinations of the pelvis were performed. Transabdominal technique was performed for global imaging of the pelvis including uterus, ovaries, adnexal regions, and pelvic cul-de-sac. It was necessary to proceed with endovaginal exam following the transabdominal exam to visualize the endometrium and adnexal structures. Color and duplex Doppler ultrasound was utilized to evaluate blood flow to the ovaries. COMPARISON:  CT abdomen pelvis 12/04/2016 FINDINGS: Uterus Measurements: 9.9 x 4.9 x 6.6 cm. No fibroids or other mass visualized. Endometrium Thickness: 4 mm. There is mobile echogenic material demonstrated within the endometrial canal. Additionally within the endometrial canal near the uterine fundus there is a 4 mm oval echogenic mass. Right ovary Measurements: 3.0 x 1.5 x 1.3 cm. Normal appearance/no adnexal mass. Left ovary Limited evaluation due to location within the pelvis and surrounding mobile bowel. Measurements: 2.7 x 1.6 x 2.0 cm. Normal appearance/no adnexal mass. Pulsed Doppler evaluation of both ovaries demonstrates normal low-resistance arterial and venous waveforms. Other findings No abnormal free fluid. IMPRESSION: Somewhat limited evaluation of the left ovary due to the location within the pelvis and extensive surrounding mobile bowel. Within the above limitation, there is no sonographic evidence to suggest torsion of either ovary. There is a 4 mm mass within the endometrium near the uterine fundus which may represent an endometrial polyp. Consider further evaluation with sonohysterogram for confirmation prior to hysteroscopy. Endometrial sampling should also be considered if patient is at high risk for endometrial carcinoma. (Ref: Radiological Reasoning: Algorithmic Workup of Abnormal Vaginal Bleeding with Endovaginal Sonography and Sonohysterography. AJR 2008; 287:O67-67) Additionally, the endometrial canal is distended with mobile echogenic material most compatible with  blood products. Electronically Signed   By: Lovey Newcomer M.D.   On: 03/08/2017 19:52   [place] Art/ven Flow Abd Pelv Doppler  Result Date: 03/08/2017 CLINICAL DATA:  Patient with lower abdominal pain for 1 day. Heavy vaginal bleeding. Evaluate for torsion. EXAM: TRANSABDOMINAL AND TRANSVAGINAL ULTRASOUND OF PELVIS DOPPLER ULTRASOUND OF OVARIES TECHNIQUE: Both transabdominal and transvaginal ultrasound examinations of the pelvis were performed. Transabdominal technique was performed for global imaging of the pelvis including uterus, ovaries, adnexal regions, and pelvic cul-de-sac. It was necessary to proceed with endovaginal exam following the transabdominal exam to visualize the endometrium and adnexal structures. Color and duplex Doppler ultrasound was utilized to evaluate blood flow to the ovaries. COMPARISON:  CT abdomen pelvis 12/04/2016 FINDINGS: Uterus Measurements: 9.9 x 4.9 x 6.6 cm. No fibroids or other mass visualized. Endometrium Thickness: 4 mm. There is mobile echogenic material demonstrated within the endometrial canal. Additionally within the endometrial canal near the uterine fundus there is a 4 mm oval echogenic mass. Right ovary Measurements: 3.0 x 1.5 x 1.3 cm. Normal appearance/no adnexal mass. Left ovary Limited evaluation due to location within the pelvis and surrounding mobile bowel. Measurements: 2.7 x 1.6 x 2.0 cm. Normal appearance/no adnexal mass. Pulsed Doppler  evaluation of both ovaries demonstrates normal low-resistance arterial and venous waveforms. Other findings No abnormal free fluid. IMPRESSION: Somewhat limited evaluation of the left ovary due to the location within the pelvis and extensive surrounding mobile bowel. Within the above limitation, there is no sonographic evidence to suggest torsion of either ovary. There is a 4 mm mass within the endometrium near the uterine fundus which may represent an endometrial polyp. Consider further evaluation with sonohysterogram for  confirmation prior to hysteroscopy. Endometrial sampling should also be considered if patient is at high risk for endometrial carcinoma. (Ref: Radiological Reasoning: Algorithmic Workup of Abnormal Vaginal Bleeding with Endovaginal Sonography and Sonohysterography. AJR 2008; 680:S81-10) Additionally, the endometrial canal is distended with mobile echogenic material most compatible with blood products. Electronically Signed   By: Lovey Newcomer M.D.   On: 03/08/2017 19:52    Procedures Procedures (including critical care time)  Medications Ordered in ED Medications  ketorolac (TORADOL) 30 MG/ML injection 30 mg (30 mg Intramuscular Given 03/08/17 1541)  morphine 4 MG/ML injection 4 mg (4 mg Intramuscular Given 03/08/17 1828)     Initial Impression / Assessment and Plan / ED Course  I have reviewed the triage vital signs and the nursing notes.  Pertinent labs & imaging results that were available during my care of the patient were reviewed by me and considered in my medical decision making (see chart for details).     Presenting with dysfunctional uterine bleeding and pelvic pain. He is nontoxic in no acute distress with stable vital signs. Overall soft and nonsurgical abdomen, primarily with suprapubic tenderness to palpation and minimal left adnexal tenderness. No severe active bleeding on pelvic exam. Pelvic ultrasound with evidence of endometrial polyp, which patient will follow-up with Dr. Glo Herring, her gynecologist about. Her hemoglobin is normal. She is not pregnant. She will continue supportive care management for home and gynecology follow-up. Strict return and follow-up instructions reviewed. She expressed understanding of all discharge instructions and felt comfortable with the plan of care.  Final Clinical Impressions(s) / ED Diagnoses   Final diagnoses:  Pelvic pain  Vaginal bleeding  Endometrial polyp    New Prescriptions New Prescriptions   No medications on file     Forde Dandy, MD 03/08/17 2012

## 2017-03-08 NOTE — ED Triage Notes (Addendum)
Patient c/o vaginal pain and bleeding intermittent x1 week but progressively gotten worse this morning. Per patient large clots. Denies any nausea or vomiting. Per patient last period 02/17/2017-normal. Denies any type of discharge or lesions. Per patient pain and pressure in lower back and abd. Patient reports taking tylenol and ibuprofen with no relief.

## 2017-03-08 NOTE — Discharge Instructions (Signed)
Please call Dr. Glo Herring for follow-up regarding your vaginal bleeding. There is a endometrial polyp on your pelvic ultrasound that you may need more work-up for. Please return for worsening symptoms, including using > 1 pad per hour, passing out, or any other symptoms concerning to you.

## 2017-03-08 NOTE — ED Notes (Addendum)
Pt refusing to get gown on

## 2017-03-09 LAB — GC/CHLAMYDIA PROBE AMP (~~LOC~~) NOT AT ARMC
Chlamydia: NEGATIVE
Neisseria Gonorrhea: NEGATIVE

## 2017-03-12 ENCOUNTER — Ambulatory Visit: Payer: Medicare Other | Admitting: Obstetrics & Gynecology

## 2017-03-19 ENCOUNTER — Encounter: Payer: Self-pay | Admitting: Obstetrics and Gynecology

## 2017-03-19 ENCOUNTER — Ambulatory Visit (INDEPENDENT_AMBULATORY_CARE_PROVIDER_SITE_OTHER): Payer: Medicare Other | Admitting: Obstetrics and Gynecology

## 2017-03-19 VITALS — BP 142/80 | HR 64 | Wt 184.0 lb

## 2017-03-19 DIAGNOSIS — N920 Excessive and frequent menstruation with regular cycle: Secondary | ICD-10-CM | POA: Diagnosis not present

## 2017-03-19 DIAGNOSIS — N840 Polyp of corpus uteri: Secondary | ICD-10-CM | POA: Diagnosis not present

## 2017-03-19 NOTE — Progress Notes (Signed)
Patient ID: [name], female   DOB: 02-Jul-1971, 46 y.o.   MRN: [id number]   [facility] Visit  @DATE @            Patient name: [name] MRN [id number]  Date of birth: 1971-06-23  CC & HPI:   Chief Complaint  Patient presents with  . Follow-up    ED visit; endometrial polyp     [name] is a 46 y.o. female presenting today for ER f/u. Pt was seen in the ED 03/08/17 for DUB and pelvic pain, had pelvic US showing evidence of endometrial polyp (60mm oval echogenic mass near the uterine fundus) and was referred here. Pt states the heaviness of her periods is a new issue for her. She is an occasional smoker.    ROS:  ROS 10 Systems reviewed and are negative for acute change except as noted in the HPI.   Pertinent History Reviewed:   Reviewed: Significant for cesarean section x4, tubal ligation  Medical         Past Medical History:  Diagnosis Date  . Arthritis    hands, knees  . Endometrial polyp   . Fibromyalgia   . Headache(784.0)   . Pregnancy   . SVD (spontaneous vaginal delivery)    x 1                              Surgical Hx:    Past Surgical History:  Procedure Laterality Date  . CESAREAN SECTION     x 4  . right thumb surgery     pins  . WISDOM TOOTH EXTRACTION     Medications: Reviewed & Updated - see associated section                       Current Outpatient Prescriptions:  .  acetaminophen (TYLENOL) 500 MG tablet, Take 1,000 mg by mouth every 6 (six) hours as needed for mild pain, moderate pain, fever or headache., Disp: , Rfl:    Social History: Reviewed -  reports that she has been smoking Cigarettes.  She has a 5.00 pack-year smoking history. She has never used smokeless tobacco.  Objective Findings:  Vitals: Blood pressure (!) 142/80, pulse 64, weight 184 lb (83.5 kg), last menstrual period 03/08/2017.  Physical Examination: Discussion only   Discussion: 1. Discussed with pt risks and benefits of endometrial ablation. Advised pt  of success/failure rates in controlling periods in a lengthy conversation with risks benefits rationale and alternatives including IUD reviewed. Brochures given. Advised pt she could wait to see if her next period continues to be heavy before she decides.   At end of discussion, pt had opportunity to ask questions and has no further questions at this time.   Specific discussion of period control as noted above. Greater than 50% was spent in counseling and coordination of care with the patient.   Total time greater than: 15 minutes.     Assessment & Plan:   A:  1. Endometrial polyp 2. Recently heavy periods    P:  1. Pt to decide on ablation vs IUD and f/u in 3 weeks.  preop appt Tentatively scheduled for July 5 or July 6  By signing my name below, I, Hansel Feinstein, attest that this documentation has been prepared under the direction and in the presence of Jonnie Kind, MD. Electronically Signed: Hansel Feinstein, ED Scribe.  03/19/17. 11:18 AM.  I personally performed the services described in this documentation, which was SCRIBED in my presence. The recorded information has been reviewed and considered accurate. It has been edited as necessary during review. Jonnie Kind, MD

## 2017-03-19 NOTE — Patient Instructions (Signed)
Endometrial Ablation Endometrial ablation is a procedure that destroys the thin inner layer of the lining of the uterus (endometrium). This procedure may be done:  To stop heavy periods.  To stop bleeding that is causing anemia.  To control irregular bleeding.  To treat bleeding caused by small tumors (fibroids) in the endometrium.  This procedure is often an alternative to major surgery, such as removal of the uterus and cervix (hysterectomy). As a result of this procedure:  You may not be able to have children. However, if you are premenopausal (you have not gone through menopause): ? You may [employer] have a small chance of getting pregnant. ? You will need to use a reliable method of birth control after the procedure to prevent pregnancy.  You may stop having a menstrual period, or you may have only a small amount of bleeding during your period. Menstruation may return several years after the procedure.  Tell a health care provider about:  Any allergies you have.  All medicines you are taking, including vitamins, herbs, eye drops, creams, and over-the-counter medicines.  Any problems you or family members have had with the use of anesthetic medicines.  Any blood disorders you have.  Any surgeries you have had.  Any medical conditions you have. What are the risks? Generally, this is a safe procedure. However, problems may occur, including:  A hole (perforation) in the uterus or bowel.  Infection of the uterus, bladder, or vagina.  Bleeding.  Damage to other structures or organs.  An air bubble in the lung (air embolus).  Problems with pregnancy after the procedure.  Failure of the procedure.  Decreased ability to diagnose cancer in the endometrium.  What happens before the procedure?  You will have tests of your endometrium to make sure there are no pre-cancerous cells or cancer cells present.  You may have an ultrasound of the uterus.  You may be given  medicines to thin the endometrium.  Ask your health care provider about: ? Changing or stopping your regular medicines. This is especially important if you take diabetes medicines or blood thinners. ? Taking medicines such as aspirin and ibuprofen. These medicines can thin your blood. Do not take these medicines before your procedure if your doctor tells you not to.  Plan to have someone take you home from the hospital or clinic. What happens during the procedure?  You will lie on an exam table with your feet and legs supported as in a pelvic exam.  To lower your risk of infection: ? Your health care team will wash or sanitize their hands and put on germ-free (sterile) gloves. ? Your genital area will be washed with soap.  An IV tube will be inserted into one of your veins.  You will be given a medicine to help you relax (sedative).  A surgical instrument with a light and camera (resectoscope) will be inserted into your vagina and moved into your uterus. This allows your surgeon to see inside your uterus.  Endometrial tissue will be removed using one of the following methods: ? Radiofrequency. This method uses a radiofrequency-alternating electric current to remove the endometrium. ? Cryotherapy. This method uses extreme cold to freeze the endometrium. ? Heated-free liquid. This method uses a heated saltwater (saline) solution to remove the endometrium. ? Microwave. This method uses high-energy microwaves to heat up the endometrium and remove it. ? Thermal balloon. This method involves inserting a catheter with a balloon tip into the uterus. The balloon tip is   filled with heated fluid to remove the endometrium. The procedure may vary among health care providers and hospitals. What happens after the procedure?  Your blood pressure, heart rate, breathing rate, and blood oxygen level will be monitored until the medicines you were given have worn off.  As tissue healing occurs, you may  notice vaginal bleeding for 4-6 weeks after the procedure. You may also experience: ? Cramps. ? Thin, watery vaginal discharge that is light pink or brown in color. ? A need to urinate more frequently than usual. ? Nausea.  Do not drive for 24 hours if you were given a sedative.  Do not have sex or insert anything into your vagina until your health care provider approves. Summary  Endometrial ablation is done to treat the many causes of heavy menstrual bleeding.  The procedure may be done only after medications have been tried to control the bleeding.  Plan to have someone take you home from the hospital or clinic. This information is not intended to replace advice given to you by your health care provider. Make sure you discuss any questions you have with your health care provider. Document Released: 08/01/2004 Document Revised: 10/09/2016 Document Reviewed: 10/09/2016 [employer] Interactive Patient Education  2017 [employer]  

## 2017-04-01 NOTE — Patient Instructions (Signed)
[name]  04/01/2017     @PREFPERIOPPHARMACY @   Your procedure is scheduled on  04/14/2017   Report to [facility] at  615  A.M.  Call this number if you have problems the morning of surgery:  [phone]   Remember:  Do not eat food or drink liquids after midnight.  Take these medicines the morning of surgery with A SIP OF WATER   none   Do not wear jewelry, make-up or nail polish.  Do not wear lotions, powders, or perfumes, or deoderant.  Do not shave 48 hours prior to surgery.  Men may shave face and neck.  Do not bring valuables to the hospital.  [facility] is not responsible for any belongings or valuables.  Contacts, dentures or bridgework may not be worn into surgery.  Leave your suitcase in the car.  After surgery it may be brought to your room.  For patients admitted to the hospital, discharge time will be determined by your treatment team.  Patients discharged the day of surgery will not be allowed to drive home.   Name and phone number of your driver:   family Special instructions:  None  Please read over the following fact sheets that you were given. Anesthesia Post-op Instructions and Care and Recovery After Surgery      Hysteroscopy Hysteroscopy is a procedure used for looking inside the womb (uterus). It may be done for various reasons, including:  To evaluate abnormal bleeding, fibroid (benign, noncancerous) tumors, polyps, scar tissue (adhesions), and possibly cancer of the uterus.  To look for lumps (tumors) and other uterine growths.  To look for causes of why a woman cannot get pregnant (infertility), causes of recurrent loss of pregnancy (miscarriages), or a lost intrauterine device (IUD).  To perform a sterilization by blocking the fallopian tubes from inside the uterus.  In this procedure, a thin, flexible tube with a tiny light and camera on the end of it (hysteroscope) is used to look inside the uterus. A hysteroscopy should be done  right after a menstrual period to be sure you are not pregnant. LET [facility] CARE PROVIDER KNOW ABOUT:  Any allergies you have.  All medicines you are taking, including vitamins, herbs, eye drops, creams, and over-the-counter medicines.  Previous problems you or members of your family have had with the use of anesthetics.  Any blood disorders you have.  Previous surgeries you have had.  Medical conditions you have. RISKS AND COMPLICATIONS Generally, this is a safe procedure. However, as with any procedure, complications can occur. Possible complications include:  Putting a hole in the uterus.  Excessive bleeding.  Infection.  Damage to the cervix.  Injury to other organs.  Allergic reaction to medicines.  Too much fluid used in the uterus for the procedure.  BEFORE THE PROCEDURE  Ask your health care provider about changing or stopping any regular medicines.  Do not take aspirin or blood thinners for 1 week before the procedure, or as directed by your health care provider. These can cause bleeding.  If you smoke, do not smoke for 2 weeks before the procedure.  In some cases, a medicine is placed in the cervix the day before the procedure. This medicine makes the cervix have a larger opening (dilate). This makes it easier for the instrument to be inserted into the uterus during the procedure.  Do not eat or drink anything for at least 8 hours before the surgery.  Arrange  for someone to take you home after the procedure. PROCEDURE  You may be given a medicine to relax you (sedative). You may also be given one of the following: ? A medicine that numbs the area around the cervix (local anesthetic). ? A medicine that makes you sleep through the procedure (general anesthetic).  The hysteroscope is inserted through the vagina into the uterus. The camera on the hysteroscope sends a picture to a TV screen. This gives the surgeon a good view inside the uterus.  During the  procedure, air or a liquid is put into the uterus, which allows the surgeon to see better.  Sometimes, tissue is gently scraped from inside the uterus. These tissue samples are sent to a lab for testing. What to expect after the procedure  If you had a general anesthetic, you may be groggy for a couple hours after the procedure.  If you had a local anesthetic, you will be able to go home as soon as you are stable and feel ready.  You may have some cramping. This normally lasts for a couple days.  You may have bleeding, which varies from light spotting for a few days to menstrual-like bleeding for 3-7 days. This is normal.  If your test results are not back during the visit, make an appointment with your health care provider to find out the results. This information is not intended to replace advice given to you by your health care provider. Make sure you discuss any questions you have with your health care provider. Document Released: 12/29/2000 Document Revised: 02/28/2016 Document Reviewed: 04/21/2013 Elsevier Interactive Patient Education  2017 [place].             Hysteroscopy, Care After Refer to this sheet in the next few weeks. These instructions provide you with information on caring for yourself after your procedure. Your health care provider may also give you more specific instructions. Your treatment has been planned according to current medical practices, but problems sometimes occur. Call your health care provider if you have any problems or questions after your procedure. What can I expect after the procedure? After your procedure, it is typical to have the following:  You may have some cramping. This normally lasts for a couple days.  You may have bleeding. This can vary from light spotting for a few days to menstrual-like bleeding for 3-7 days.  Follow these instructions at home:  Rest for the first 1-2 days after the procedure.  Only take  over-the-counter or prescription medicines as directed by your health care provider. Do not take aspirin. It can increase the chances of bleeding.  Take showers instead of baths for 2 weeks or as directed by your health care provider.  Do not drive for 24 hours or as directed.  Do not drink alcohol while taking pain medicine.  Do not use tampons, douche, or have sexual intercourse for 2 weeks or until your health care provider says it is okay.  Take your temperature twice a day for 4-5 days. Write it down each time.  Follow your health care provider's advice about diet, exercise, and lifting.  If you develop constipation, you may: ? Take a mild laxative if your health care provider approves. ? Add bran foods to your diet. ? Drink enough fluids to keep your urine clear or pale yellow.  Try to have someone with you or available to you for the first 24-48 hours, especially if you were given a general anesthetic.  Follow  up with your health care provider as directed. Contact a health care provider if:  You feel dizzy or lightheaded.  You feel sick to your stomach (nauseous).  You have abnormal vaginal discharge.  You have a rash.  You have pain that is not controlled with medicine. Get help right away if:  You have bleeding that is heavier than a normal menstrual period.  You have a fever.  You have increasing cramps or pain, not controlled with medicine.  You have new belly (abdominal) pain.  You pass out.  You have pain in the tops of your shoulders (shoulder strap areas).  You have shortness of breath. This information is not intended to replace advice given to you by your health care provider. Make sure you discuss any questions you have with your health care provider. Document Released: 07/13/2013 Document Revised: 02/28/2016 Document Reviewed: 04/21/2013 Elsevier Interactive Patient Education  2017 [place].  Endometrial Ablation Endometrial ablation is a  procedure that destroys the thin inner layer of the lining of the uterus (endometrium). This procedure may be done:  To stop heavy periods.  To stop bleeding that is causing anemia.  To control irregular bleeding.  To treat bleeding caused by small tumors (fibroids) in the endometrium.  This procedure is often an alternative to major surgery, such as removal of the uterus and cervix (hysterectomy). As a result of this procedure:  You may not be able to have children. However, if you are premenopausal (you have not gone through menopause): ? You may [place] have a small chance of getting pregnant. ? You will need to use a reliable method of birth control after the procedure to prevent pregnancy.  You may stop having a menstrual period, or you may have only a small amount of bleeding during your period. Menstruation may return several years after the procedure.  Tell a health care provider about:  Any allergies you have.  All medicines you are taking, including vitamins, herbs, eye drops, creams, and over-the-counter medicines.  Any problems you or family members have had with the use of anesthetic medicines.  Any blood disorders you have.  Any surgeries you have had.  Any medical conditions you have. What are the risks? Generally, this is a safe procedure. However, problems may occur, including:  A hole (perforation) in the uterus or bowel.  Infection of the uterus, bladder, or vagina.  Bleeding.  Damage to other structures or organs.  An air bubble in the lung (air embolus).  Problems with pregnancy after the procedure.  Failure of the procedure.  Decreased ability to diagnose cancer in the endometrium.  What happens before the procedure?  You will have tests of your endometrium to make sure there are no pre-cancerous cells or cancer cells present.  You may have an ultrasound of the uterus.  You may be given medicines to thin the endometrium.  Ask your health  care provider about: ? Changing or stopping your regular medicines. This is especially important if you take diabetes medicines or blood thinners. ? Taking medicines such as aspirin and ibuprofen. These medicines can thin your blood. Do not take these medicines before your procedure if your doctor tells you not to.  Plan to have someone take you home from the hospital or clinic. What happens during the procedure?  You will lie on an exam table with your feet and legs supported as in a pelvic exam.  To lower your risk of infection: ? Your health care team will  wash or sanitize their hands and put on germ-free (sterile) gloves. ? Your genital area will be washed with soap.  An IV tube will be inserted into one of your veins.  You will be given a medicine to help you relax (sedative).  A surgical instrument with a light and camera (resectoscope) will be inserted into your vagina and moved into your uterus. This allows your surgeon to see inside your uterus.  Endometrial tissue will be removed using one of the following methods: ? Radiofrequency. This method uses a radiofrequency-alternating electric current to remove the endometrium. ? Cryotherapy. This method uses extreme cold to freeze the endometrium. ? Heated-free liquid. This method uses a heated saltwater (saline) solution to remove the endometrium. ? Microwave. This method uses high-energy microwaves to heat up the endometrium and remove it. ? Thermal balloon. This method involves inserting a catheter with a balloon tip into the uterus. The balloon tip is filled with heated fluid to remove the endometrium. The procedure may vary among health care providers and hospitals. What happens after the procedure?  Your blood pressure, heart rate, breathing rate, and blood oxygen level will be monitored until the medicines you were given have worn off.  As tissue healing occurs, you may notice vaginal bleeding for 4-6 weeks after the  procedure. You may also experience: ? Cramps. ? Thin, watery vaginal discharge that is light pink or brown in color. ? A need to urinate more frequently than usual. ? Nausea.  Do not drive for 24 hours if you were given a sedative.  Do not have sex or insert anything into your vagina until your health care provider approves. Summary  Endometrial ablation is done to treat the many causes of heavy menstrual bleeding.  The procedure may be done only after medications have been tried to control the bleeding.  Plan to have someone take you home from the hospital or clinic. This information is not intended to replace advice given to you by your health care provider. Make sure you discuss any questions you have with your health care provider. Document Released: 08/01/2004 Document Revised: 10/09/2016 Document Reviewed: 10/09/2016 Elsevier Interactive Patient Education  2017 [employer]  Dilation and Curettage or Vacuum Curettage, Care After This sheet gives you information about how to care for yourself after your procedure. Your health care provider may also give you more specific instructions. If you have problems or questions, contact your health care provider. What can I expect after the procedure? After your procedure, it is common to have:  Mild pain or cramping.  Some vaginal bleeding or spotting.  These may last for up to 2 weeks after your procedure. Follow these instructions at home: Activity   Do not drive or use heavy machinery while taking prescription pain medicine.  Avoid driving for the first 24 hours after your procedure.  Take frequent, short walks, followed by rest periods, throughout the day. Ask your health care provider what activities are safe for you. After 1-2 days, you may be able to return to your normal activities.  Do not lift anything heavier than 10 lb (4.5 kg) until your health care provider approves.  For at least 2 weeks, or as long as told by  your health care provider, do not: ? Douche. ? Use tampons. ? Have sexual intercourse. General instructions   Take over-the-counter and prescription medicines only as told by your health care provider. This is especially important if you take blood thinning medicine.  Do not take  baths, swim, or use a hot tub until your health care provider approves. Take showers instead of baths.  Wear compression stockings as told by your health care provider. These stockings help to prevent blood clots and reduce swelling in your legs.  It is your responsibility to get the results of your procedure. Ask your health care provider, or the department performing the procedure, when your results will be ready.  Keep all follow-up visits as told by your health care provider. This is important. Contact a health care provider if:  You have severe cramps that get worse or that do not get better with medicine.  You have severe abdominal pain.  You cannot drink fluids without vomiting.  You develop pain in a different area of your pelvis.  You have bad-smelling vaginal discharge.  You have a rash. Get help right away if:  You have vaginal bleeding that soaks more than one sanitary pad in 1 hour, for 2 hours in a row.  You pass large blood clots from your vagina.  You have a fever that is above 100.17F (38.0C).  Your abdomen feels very tender or hard.  You have chest pain.  You have shortness of breath.  You cough up blood.  You feel dizzy or light-headed.  You faint.  You have pain in your neck or shoulder area. This information is not intended to replace advice given to you by your health care provider. Make sure you discuss any questions you have with your health care provider. Document Released: 09/19/2000 Document Revised: 05/21/2016 Document Reviewed: 04/24/2016 Elsevier Interactive Patient Education  2018 [employer].  Dilation and Curettage or Vacuum Curettage, Care  After These instructions give you information about caring for yourself after your procedure. Your doctor may also give you more specific instructions. Call your doctor if you have any problems or questions after your procedure. Follow these instructions at home: Activity  Do not drive or use heavy machinery while taking prescription pain medicine.  For 24 hours after your procedure, avoid driving.  Take short walks often, followed by rest periods. Ask your doctor what activities are safe for you. After one or two days, you may be able to return to your normal activities.  Do not lift anything that is heavier than 10 lb (4.5 kg) until your doctor approves.  For at least 2 weeks, or as long as told by your doctor: ? Do not douche. ? Do not use tampons. ? Do not have sex. General instructions  Take over-the-counter and prescription medicines only as told by your doctor. This is very important if you take blood thinning medicine.  Do not take baths, swim, or use a hot tub until your doctor approves. Take showers instead of baths.  Wear compression stockings as told by your doctor.  It is up to you to get the results of your procedure. Ask your doctor when your results will be ready.  Keep all follow-up visits as told by your doctor. This is important. Contact a doctor if:  You have very bad cramps that get worse or do not get better with medicine.  You have very bad pain in your belly (abdomen).  You cannot drink fluids without throwing up (vomiting).  You get pain in a different part of the area between your belly and thighs (pelvis).  You have bad-smelling discharge from your vagina.  You have a rash. Get help right away if:  You are bleeding a lot from your vagina. A lot  of bleeding means soaking more than one sanitary pad in an hour, for 2 hours in a row.  You have clumps of blood (blood clots) coming from your vagina.  You have a fever or chills.  Your belly feels  very tender or hard.  You have chest pain.  You have trouble breathing.  You cough up blood.  You feel dizzy.  You feel light-headed.  You pass out (faint).  You have pain in your neck or shoulder area. Summary  Take short walks often, followed by rest periods. Ask your doctor what activities are safe for you. After one or two days, you may be able to return to your normal activities.  Do not lift anything that is heavier than 10 lb (4.5 kg) until your doctor approves.  Do not take baths, swim, or use a hot tub until your doctor approves. Take showers instead of baths.  Contact your doctor if you have any symptoms of infection, like bad-smelling discharge from your vagina. This information is not intended to replace advice given to you by your health care provider. Make sure you discuss any questions you have with your health care provider. Document Released: 07/01/2008 Document Revised: 06/09/2016 Document Reviewed: 06/09/2016 Elsevier Interactive Patient Education  2017 [place] Anesthesia, Adult General anesthesia is the use of medicines to make a person "go to sleep" (be unconscious) for a medical procedure. General anesthesia is often recommended when a procedure:  Is long.  Requires you to be [place] or in an unusual position.  Is major and can cause you to lose blood.  Is impossible to do without general anesthesia.  The medicines used for general anesthesia are called general anesthetics. In addition to making you sleep, the medicines:  Prevent pain.  Control your blood pressure.  Relax your muscles.  Tell a health care provider about:  Any allergies you have.  All medicines you are taking, including vitamins, herbs, eye drops, creams, and over-the-counter medicines.  Any problems you or family members have had with anesthetic medicines.  Types of anesthetics you have had in the past.  Any bleeding disorders you have.  Any surgeries you  have had.  Any medical conditions you have.  Any history of heart or lung conditions, such as heart failure, sleep apnea, or chronic obstructive pulmonary disease (COPD).  Whether you are pregnant or may be pregnant.  Whether you use tobacco, alcohol, marijuana, or street drugs.  Any history of Armed forces logistics/support/administrative officer.  Any history of depression or anxiety. What are the risks? Generally, this is a safe procedure. However, problems may occur, including:  Allergic reaction to anesthetics.  Lung and heart problems.  Inhaling food or liquids from your stomach into your lungs (aspiration).  Injury to nerves.  Waking up during your procedure and being unable to move (rare).  Extreme agitation or a state of mental confusion (delirium) when you wake up from the anesthetic.  Air in the bloodstream, which can lead to stroke.  These problems are more likely to develop if you are having a major surgery or if you have an advanced medical condition. You can prevent some of these complications by answering all of your health care provider's questions thoroughly and by following all pre-procedure instructions. General anesthesia can cause side effects, including:  Nausea or vomiting  A sore throat from the breathing tube.  Feeling cold or shivery.  Feeling tired, washed out, or achy.  Sleepiness or drowsiness.  Confusion or agitation.  What  happens before the procedure? Staying hydrated Follow instructions from your health care provider about hydration, which may include:  Up to 2 hours before the procedure - you may continue to drink clear liquids, such as water, clear fruit juice, black coffee, and plain tea.  Eating and drinking restrictions Follow instructions from your health care provider about eating and drinking, which may include:  8 hours before the procedure - stop eating heavy meals or foods such as meat, fried foods, or fatty foods.  6 hours before the procedure - stop  eating light meals or foods, such as toast or cereal.  6 hours before the procedure - stop drinking milk or drinks that contain milk.  2 hours before the procedure - stop drinking clear liquids.  Medicines  Ask your health care provider about: ? Changing or stopping your regular medicines. This is especially important if you are taking diabetes medicines or blood thinners. ? Taking medicines such as aspirin and ibuprofen. These medicines can thin your blood. Do not take these medicines before your procedure if your health care provider instructs you not to. ? Taking new dietary supplements or medicines. Do not take these during the week before your procedure unless your health care provider approves them.  If you are told to take a medicine or to continue taking a medicine on the day of the procedure, take the medicine with sips of water. General instructions   Ask if you will be going home the same day, the following day, or after a longer hospital stay. ? Plan to have someone take you home. ? Plan to have someone stay with you for the first 24 hours after you leave the hospital or clinic.  For 3-6 weeks before the procedure, try not to use any tobacco products, such as cigarettes, chewing tobacco, and e-cigarettes.  You may brush your teeth on the morning of the procedure, but make sure to spit out the toothpaste. What happens during the procedure?  You will be given anesthetics through a mask and through an IV tube in one of your veins.  You may receive medicine to help you relax (sedative).  As soon as you are asleep, a breathing tube may be used to help you breathe.  An anesthesia specialist will stay with you throughout the procedure. He or she will help keep you comfortable and safe by continuing to give you medicines and adjusting the amount of medicine that you get. He or she will also watch your blood pressure, pulse, and oxygen levels to make sure that the anesthetics do  not cause any problems.  If a breathing tube was used to help you breathe, it will be removed before you wake up. The procedure may vary among health care providers and hospitals. What happens after the procedure?  You will wake up, often slowly, after the procedure is complete, usually in a recovery area.  Your blood pressure, heart rate, breathing rate, and blood oxygen level will be monitored until the medicines you were given have worn off.  You may be given medicine to help you calm down if you feel anxious or agitated.  If you will be going home the same day, your health care provider may check to make sure you can stand, drink, and urinate.  Your health care providers will treat your pain and side effects before you go home.  Do not drive for 24 hours if you received a sedative.  You may: ? Feel nauseous and vomit. ?  Have a sore throat. ? Have mental slowness. ? Feel cold or shivery. ? Feel sleepy. ? Feel tired. ? Feel sore or achy, even in parts of your body where you did not have surgery. This information is not intended to replace advice given to you by your health care provider. Make sure you discuss any questions you have with your health care provider. Document Released: 12/30/2007 Document Revised: 03/04/2016 Document Reviewed: 09/06/2015 Elsevier Interactive Patient Education  2018 [place] [facility], Adult, Care After These instructions provide you with information about caring for yourself after your procedure. Your health care provider may also give you more specific instructions. Your treatment has been planned according to current medical practices, but problems sometimes occur. Call your health care provider if you have any problems or questions after your procedure. What can I expect after the procedure? After the procedure, it is common to have:  Vomiting.  A sore throat.  Mental slowness.  It is common to feel:  Nauseous.  Cold or  shivery.  Sleepy.  Tired.  Sore or achy, even in parts of your body where you did not have surgery.  Follow these instructions at home: For at least 24 hours after the procedure:  Do not: ? Participate in activities where you could fall or become injured. ? Drive. ? Use heavy machinery. ? Drink alcohol. ? Take sleeping pills or medicines that cause drowsiness. ? Make important decisions or sign legal documents. ? Take care of children on your own.  Rest. Eating and drinking  If you vomit, drink water, juice, or soup when you can drink without vomiting.  Drink enough fluid to keep your urine clear or pale yellow.  Make sure you have little or no nausea before eating solid foods.  Follow the diet recommended by your health care provider. General instructions  Have a responsible adult stay with you until you are awake and alert.  Return to your normal activities as told by your health care provider. Ask your health care provider what activities are safe for you.  Take over-the-counter and prescription medicines only as told by your health care provider.  If you smoke, do not smoke without supervision.  Keep all follow-up visits as told by your health care provider. This is important. Contact a health care provider if:  You continue to have nausea or vomiting at home, and medicines are not helpful.  You cannot drink fluids or start eating again.  You cannot urinate after 8-12 hours.  You develop a skin rash.  You have fever.  You have increasing redness at the site of your procedure. Get help right away if:  You have difficulty breathing.  You have chest pain.  You have unexpected bleeding.  You feel that you are having a life-threatening or urgent problem. This information is not intended to replace advice given to you by your health care provider. Make sure you discuss any questions you have with your health care provider. Document Released: 12/29/2000  Document Revised: 02/25/2016 Document Reviewed: 09/06/2015 Elsevier Interactive Patient Education  [employer].

## 2017-04-09 ENCOUNTER — Encounter: Payer: Self-pay | Admitting: Obstetrics and Gynecology

## 2017-04-09 ENCOUNTER — Other Ambulatory Visit (HOSPITAL_COMMUNITY)
Admission: RE | Admit: 2017-04-09 | Discharge: 2017-04-09 | Disposition: A | Discharge status: Home / Self Care | Payer: Medicare Other | Source: Ambulatory Visit | Attending: Obstetrics and Gynecology | Admitting: Obstetrics and Gynecology

## 2017-04-09 ENCOUNTER — Ambulatory Visit (INDEPENDENT_AMBULATORY_CARE_PROVIDER_SITE_OTHER): Payer: Medicare Other | Admitting: Obstetrics and Gynecology

## 2017-04-09 VITALS — BP 162/86 | HR 76 | Ht 66.0 in | Wt 183.6 lb

## 2017-04-09 DIAGNOSIS — R102 Pelvic and perineal pain: Secondary | ICD-10-CM

## 2017-04-09 DIAGNOSIS — Z01818 Encounter for other preprocedural examination: Secondary | ICD-10-CM | POA: Insufficient documentation

## 2017-04-09 DIAGNOSIS — Z124 Encounter for screening for malignant neoplasm of cervix: Secondary | ICD-10-CM | POA: Diagnosis present

## 2017-04-09 DIAGNOSIS — N920 Excessive and frequent menstruation with regular cycle: Secondary | ICD-10-CM

## 2017-04-09 NOTE — Progress Notes (Signed)
[facility] Visit  @DATE @            Patient name: [name] MRN [id number]  Date of birth: 01/09/1971  CC & HPI:  [name] is a 46 y.o. female presenting today for heavy periods and management of an questionable endometrial polyp. She states her last period was 6/23-6/24. Periods are off and on for 7 days and heavy. She has associated intermittent pelvic pain that is present with periods as well as when she is not actively bleeding. Her last US showed a fluid filled pocket.  Pt has multiple  Questions and it is clear that she does not fully comprehend the offered hysteroscopy removal of endometrial tissues ROS:  ROS +menorrhagia  + intermittent pelvic pain  Pertinent History Reviewed:   Reviewed: Significant for  Medical         Past Medical History:  Diagnosis Date   Arthritis    hands, knees   Endometrial polyp    Fibromyalgia    Headache(784.0)    Pregnancy    SVD (spontaneous vaginal delivery)    x 1                              Surgical Hx:    Past Surgical History:  Procedure Laterality Date   CESAREAN SECTION     x 4   right thumb surgery     pins   WISDOM TOOTH EXTRACTION     Medications: Reviewed & Updated - see associated section                       Current Outpatient Prescriptions:    fluticasone (FLONASE) 50 MCG/ACT nasal spray, Place 1 spray into both nostrils daily as needed for allergies or rhinitis., Disp: , Rfl:    IBU 800 MG tablet, Take 800 mg by mouth every 6 (six) hours as needed for moderate pain. , Disp: , Rfl:    ondansetron (ZOFRAN) 4 MG tablet, Take 4 mg by mouth every 8 (eight) hours as needed for nausea or vomiting. , Disp: , Rfl:    Social History: Reviewed -  reports that she has been smoking Cigarettes.  She has a 5.00 pack-year smoking history. She has never used smokeless tobacco.  Objective Findings:  Vitals: Blood pressure (!) 162/86, pulse 76, height 5\' 6"  (1.676 m), weight 183 lb 9.6 oz (83.3 kg),  last menstrual period 03/29/2017.  Physical Examination: General appearance - alert, well appearing, and in no distress Mental status - alert, oriented to person, place, and time Pelvic - normal external genitalia, vulva, vagina, cervix, uterus and adnexa. Well supported uterus, multiparous cervix, uterus anterior, normal size, shape, and non tender  Discussion: Discussed with pt options to control heavy periods.    At end of discussion, pt had opportunity to ask questions and has no further questions at this time.   Specific discussion of endometrial ablation vs IUD as noted above. Greater than 50% was spent in counseling and coordination of care with the patient.   Total time greater than: 25 minutes.    Assessment & Plan:   A:  1. Menorrhagia  2. 89mm ?endometrial polpy  P:  1. Repeat US next week not wthat to assess endometrium    By signing my name below, I, Sonum Patel, attest that this documentation has been prepared under the direction and in the presence of [place],  Angelyn Punt, MD. Electronically Signed: Sonum Patel, Education administrator. 04/09/17. 2:48 PM.  I personally performed the services described in this documentation, which was SCRIBED in my presence. The recorded information has been reviewed and considered accurate. It has been edited as necessary during review. Jonnie Kind, MD

## 2017-04-10 ENCOUNTER — Encounter (HOSPITAL_COMMUNITY): Payer: Self-pay

## 2017-04-10 ENCOUNTER — Encounter (HOSPITAL_COMMUNITY)
Admission: RE | Admit: 2017-04-10 | Discharge: 2017-04-10 | Disposition: A | Discharge status: Home / Self Care | Payer: Medicare Other | Source: Ambulatory Visit | Attending: Obstetrics and Gynecology | Admitting: Obstetrics and Gynecology

## 2017-04-12 ENCOUNTER — Other Ambulatory Visit: Payer: Self-pay | Admitting: Adult Health

## 2017-04-12 ENCOUNTER — Other Ambulatory Visit: Payer: Self-pay | Admitting: Obstetrics & Gynecology

## 2017-04-13 ENCOUNTER — Other Ambulatory Visit (HOSPITAL_COMMUNITY): Payer: Self-pay

## 2017-04-13 LAB — CYTOLOGY - PAP
ADEQUACY: ABSENT
Chlamydia: NEGATIVE
Diagnosis: NEGATIVE
HPV: NOT DETECTED
NEISSERIA GONORRHEA: NEGATIVE

## 2017-04-14 ENCOUNTER — Encounter (HOSPITAL_COMMUNITY): Admission: RE | Payer: Self-pay | Source: Ambulatory Visit

## 2017-04-14 ENCOUNTER — Other Ambulatory Visit: Payer: Self-pay | Admitting: Obstetrics and Gynecology

## 2017-04-14 ENCOUNTER — Ambulatory Visit (HOSPITAL_COMMUNITY)
Admission: RE | Admit: 2017-04-14 | Payer: Medicare Other | Source: Ambulatory Visit | Admitting: Obstetrics and Gynecology

## 2017-04-14 DIAGNOSIS — N920 Excessive and frequent menstruation with regular cycle: Secondary | ICD-10-CM

## 2017-04-14 DIAGNOSIS — R102 Pelvic and perineal pain: Secondary | ICD-10-CM

## 2017-04-14 SURGERY — DILATATION AND CURETTAGE /HYSTEROSCOPY
Anesthesia: General

## 2017-04-15 ENCOUNTER — Ambulatory Visit (INDEPENDENT_AMBULATORY_CARE_PROVIDER_SITE_OTHER): Payer: Medicare Other

## 2017-04-15 ENCOUNTER — Ambulatory Visit (INDEPENDENT_AMBULATORY_CARE_PROVIDER_SITE_OTHER): Payer: Medicare Other | Admitting: Obstetrics and Gynecology

## 2017-04-15 ENCOUNTER — Encounter: Payer: Self-pay | Admitting: Obstetrics and Gynecology

## 2017-04-15 VITALS — BP 130/80 | HR 80 | Wt 185.4 lb

## 2017-04-15 DIAGNOSIS — N920 Excessive and frequent menstruation with regular cycle: Secondary | ICD-10-CM

## 2017-04-15 DIAGNOSIS — R102 Pelvic and perineal pain: Secondary | ICD-10-CM | POA: Diagnosis not present

## 2017-04-15 DIAGNOSIS — N854 Malposition of uterus: Secondary | ICD-10-CM

## 2017-04-15 NOTE — Patient Instructions (Signed)

## 2017-04-15 NOTE — Progress Notes (Signed)
   [facility] Visit  04/15/2017            Patient name: [name] MRN [id number]  Date of birth: December 24, 1970  CC & HPI:  [name] is a 46 y.o. female presenting today to repeat an U/S. Pt states she is experiencing lower abdominal pain. Her last menstrual period was very heavy, and she had clots coming out.   ROS:  ROS  +lower abdominal pain -fever   Pertinent History Reviewed:   Reviewed: Significant for endometrial polyp, tubal ligation Medical         Past Medical History:  Diagnosis Date  . Arthritis    hands, knees  . Endometrial polyp   . Fibromyalgia   . Headache(784.0)   . Pregnancy   . SVD (spontaneous vaginal delivery)    x 1                              Surgical Hx:    Past Surgical History:  Procedure Laterality Date  . CESAREAN SECTION     x 4  . right thumb surgery     pins  . WISDOM TOOTH EXTRACTION     Medications: Reviewed & Updated - see associated section                       Current Outpatient Prescriptions:  .  IBU 800 MG tablet, TAKE 1 TABLET BY MOUTH 3 TIMES DAILY AS NEEDED FOR PAIN., Disp: 30 tablet, Rfl: 1 .  ondansetron (ZOFRAN) 4 MG tablet, TAKE (1) TABLET BY MOUTH EVERY (6) HOURS AS NEEDED FOR NAUSEA., Disp: 10 tablet, Rfl: 0 .  fluticasone (FLONASE) 50 MCG/ACT nasal spray, Place 1 spray into both nostrils daily as needed for allergies or rhinitis., Disp: , Rfl:    Social History: Reviewed -  reports that she has been smoking Cigarettes.  She has a 5.00 pack-year smoking history. She has never used smokeless tobacco.  Objective Findings:  Vitals: Blood pressure 130/80, pulse 80, weight 185 lb 6.4 oz (84.1 kg), last menstrual period 03/29/2017.  Physical Examination: General appearance - alert, well appearing, and in no distress Mental status - alert, oriented to person, place, and time Pelvic - not indicated  Discussion: 1. Discussed with pt risks and benefits of IUD vs surgery to manage her heavy periods. Pt would  like to have IUD insertion.   At end of discussion, pt had opportunity to ask questions and has no further questions at this time.   Specific discussion of management of menorrhagia   with IUD as noted above. Greater than 50% was spent in counseling and coordination of care with the patient.   Total time greater than: 25 minutes.    Assessment & Plan:   A:  1. Menorrhagia with normal menses  P:  1. F/u in 3 weeks for IUD insertion when pt is having her menstrual period     By signing my name below, I, [name], attest that this documentation has been prepared under the direction and in the presence of Jonnie Kind, MD. Electronically Signed: Jabier Gauss, ED Scribe. 04/15/17. 2:56 PM.  I personally performed the services described in this documentation, which was SCRIBED in my presence. The recorded information has been reviewed and considered accurate. It has been edited as necessary during review. Jonnie Kind, MD

## 2017-04-15 NOTE — Progress Notes (Signed)
PELVIC US TA/TV: homogeneous anteverted uterus,wnl,EEC 9.54mm normal ovaries bilat,right ovary appears mobile,unable to slide left ovary,no free fluid

## 2017-04-20 ENCOUNTER — Other Ambulatory Visit: Payer: Medicare Other

## 2017-04-21 NOTE — Progress Notes (Signed)
[facility] Visit  04/15/2017            Patient name: [name]         MRN [id number]  Date of birth: [birth year]  CC & HPI:  [name] is a 46 y.o. female presenting today to repeat an U/S. Pt states she is experiencing lower abdominal pain. Her last menstrual period was very heavy, and she had clots coming out. Pt her for followup u/s as last u/s had ? Of polyp versus clot , while pt was on menses.  Pt desires reduciton in severity of flow. Discussed endometrila ablation vs IUD and pt interested in IUD. 90% success rate quoted to pt.  ROS:  ROS  +lower abdominal pain -fever   Pertinent History Reviewed:   Reviewed: Significant for endometrial polyp, tubal ligation Medical             Past Medical History:  Diagnosis Date  . Arthritis    hands, knees  . Endometrial polyp   . Fibromyalgia   . Headache(784.0)   . Pregnancy   . SVD (spontaneous vaginal delivery)    x 1                              Surgical Hx:         Past Surgical History:  Procedure Laterality Date  . CESAREAN SECTION     x 4  . right thumb surgery     pins  . WISDOM TOOTH EXTRACTION     Medications: Reviewed & Updated - see associated section                       Current Outpatient Prescriptions:  .  IBU 800 MG tablet, TAKE 1 TABLET BY MOUTH 3 TIMES DAILY AS NEEDED FOR PAIN., Disp: 30 tablet, Rfl: 1 .  ondansetron (ZOFRAN) 4 MG tablet, TAKE (1) TABLET BY MOUTH EVERY (6) HOURS AS NEEDED FOR NAUSEA., Disp: 10 tablet, Rfl: 0 .  fluticasone (FLONASE) 50 MCG/ACT nasal spray, Place 1 spray into both nostrils daily as needed for allergies or rhinitis., Disp: , Rfl:    Social History: Reviewed -  reports that she has been smoking Cigarettes.  She has a 5.00 pack-year smoking history. She has never used smokeless tobacco.  Objective Findings:  Vitals: Blood pressure 130/80, pulse 80, weight 185 lb 6.4 oz (84.1 kg), last menstrual period  03/29/2017.  Physical Examination: General appearance - alert, well appearing, and in no distress Mental status - alert, oriented to person, place, and time Pelvic - not indicated  Discussion: 1. Discussed with pt risks and benefits of IUD vs surgery to manage her heavy periods. Pt would like to have IUD insertion.   At end of discussion, pt had opportunity to ask questions and has no further questions at this time.   Specific discussion of management of menorrhagia   with IUD as noted above. Greater than 50% was spent in counseling and coordination of care with the patient.   Total time greater than: 25 minutes.    Assessment & Plan:   A:  1. Menorrhagia with normal menses  P:  1. F/u in 3 weeks for IUD insertion when pt is having her menstrual period 2.     By signing my name below, I, [name], attest that this documentation has been prepared under the direction and in the presence  of Jonnie Kind, MD. Electronically Signed: Jabier Gauss, ED Scribe. 04/15/17. 2:56 PM.  I personally performed the services described in this documentation, which was SCRIBED in my presence. The recorded information has been reviewed and considered accurate. It has been edited as necessary during review. Jonnie Kind, MD

## 2017-04-22 ENCOUNTER — Telehealth: Payer: Self-pay | Admitting: Obstetrics and Gynecology

## 2017-04-27 MED ORDER — NORETHINDRONE 0.35 MG PO TABS: 1.0000 | ORAL_TABLET | Freq: Every day | ORAL | 11 refills | Status: DC

## 2017-04-27 NOTE — Telephone Encounter (Signed)
Patient reports she is unable to get an IUD placed because Medicare/Medicare/Medicaid does not cover IUDs for menstrual regulation. He may be possible to pursue a waiver, but this will take times have placed her on Micronor 1 tablet daily

## 2017-04-30 ENCOUNTER — Ambulatory Visit: Payer: Medicare Other | Admitting: Obstetrics and Gynecology

## 2017-08-04 ENCOUNTER — Ambulatory Visit: Payer: Self-pay | Admitting: Physician Assistant

## 2017-08-07 ENCOUNTER — Ambulatory Visit: Payer: Self-pay | Admitting: Nurse Practitioner

## 2017-11-28 IMAGING — US US PELVIS COMPLETE
1 series · 13 of 25 positions shown · non-contrast
Comparison: CT abdomen pelvis 12/04/2016

CLINICAL DATA: Patient with lower abdominal pain for 1 day. Heavy
vaginal bleeding. Evaluate for torsion.

EXAM:
TRANSABDOMINAL AND TRANSVAGINAL ULTRASOUND OF PELVIS
DOPPLER ULTRASOUND OF OVARIES
TECHNIQUE: Both transabdominal and transvaginal ultrasound examinations of the
pelvis were performed. Transabdominal technique was performed for
global imaging of the pelvis including uterus, ovaries, adnexal
regions, and pelvic cul-de-sac.
It was necessary to proceed with endovaginal exam following the
transabdominal exam to visualize the endometrium and adnexal
structures. Color and duplex Doppler ultrasound was utilized to
evaluate blood flow to the ovaries.

[Series 1: us pelvis complete · 0.21mm/px · 13 of 97 slices shown]
[im 1/97]
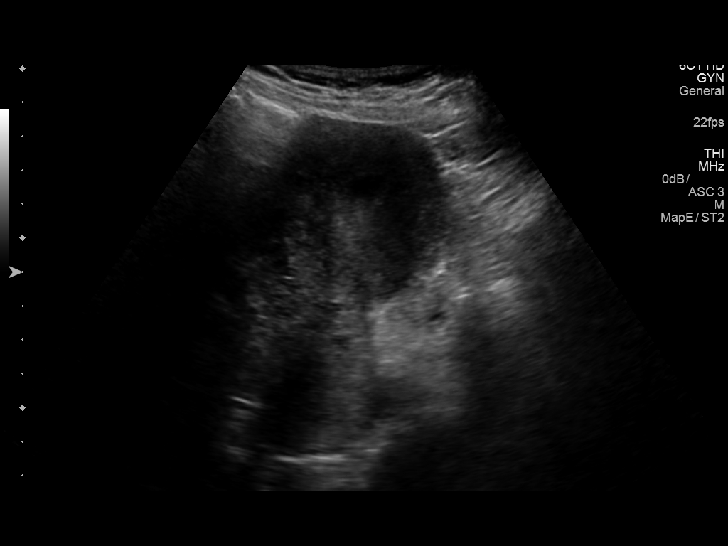
[im 9/97]
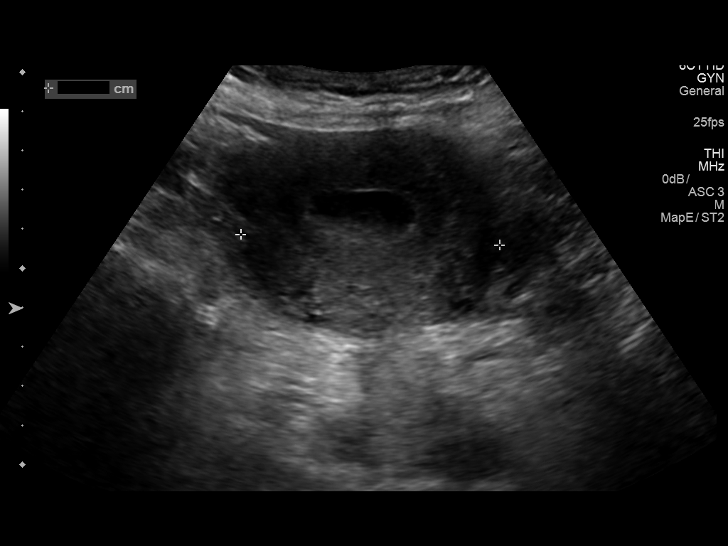
[im 17/97]
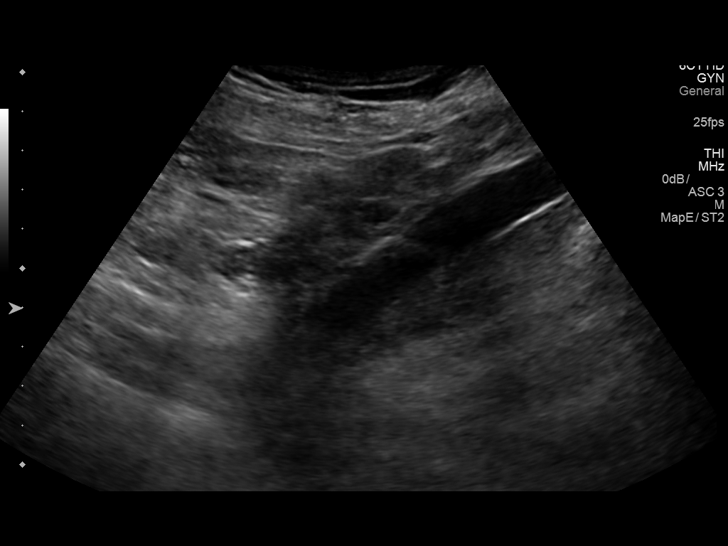
[im 25/97]
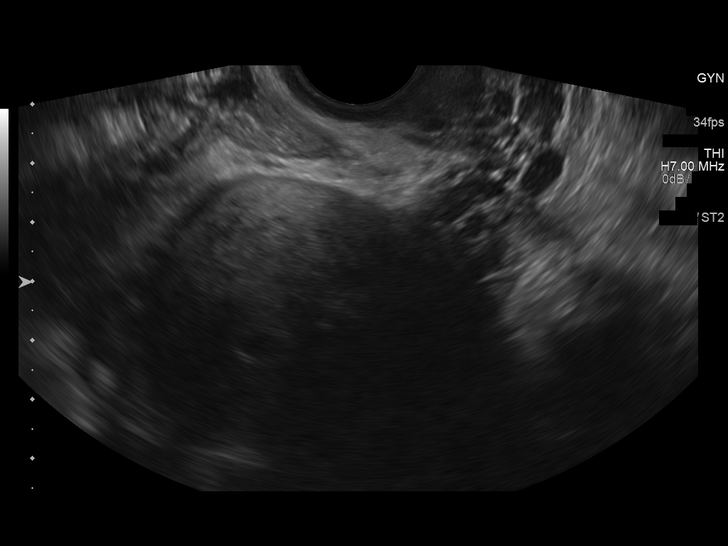
[im 33/97]
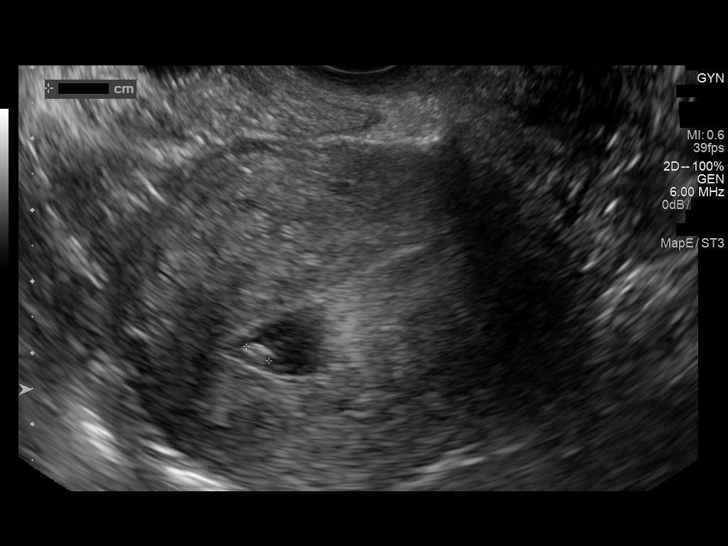
[im 41/97]
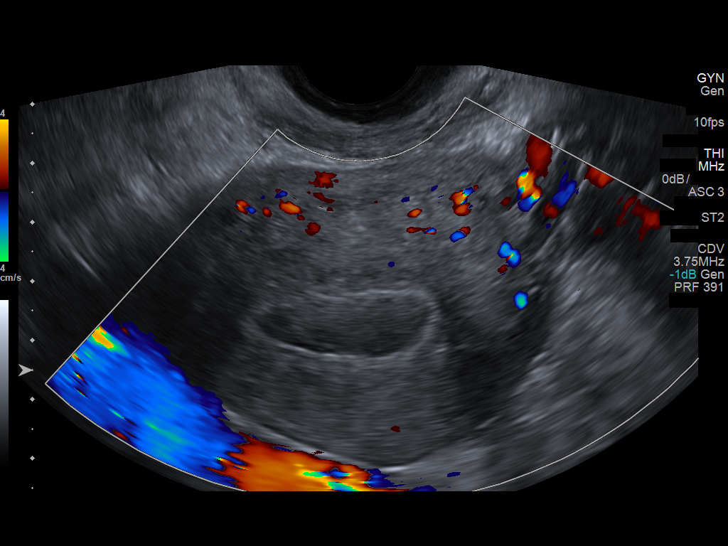
[im 49/97]
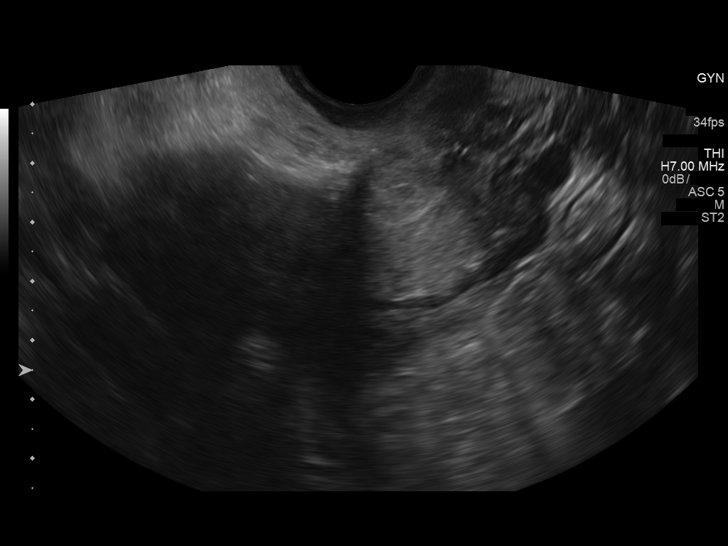
[im 57/97]
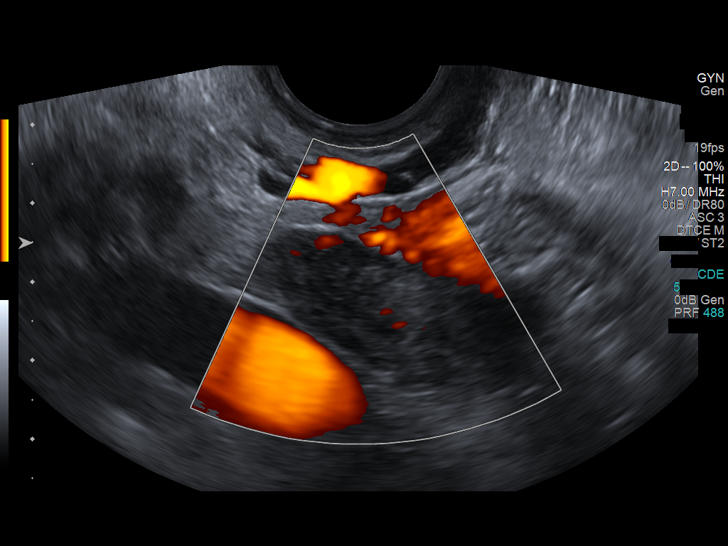
[im 65/97]
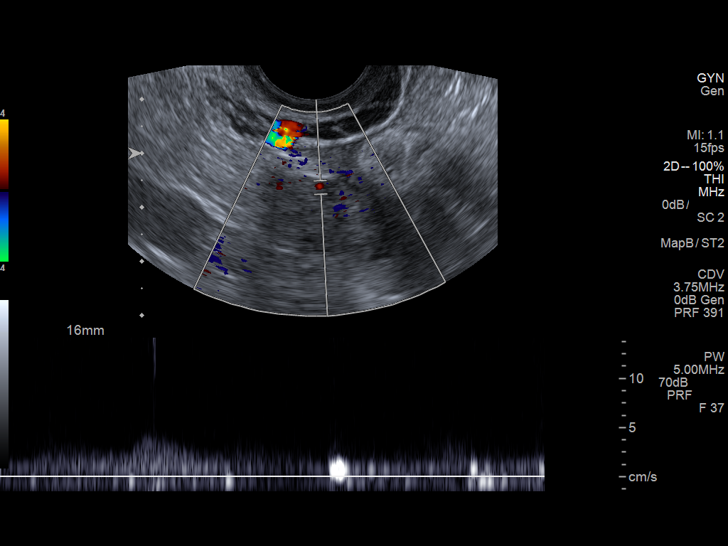
[im 73/97]
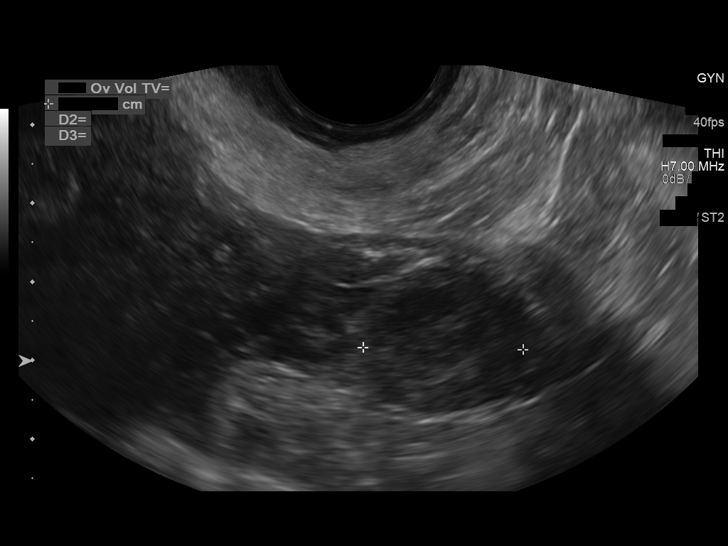
[im 81/97]
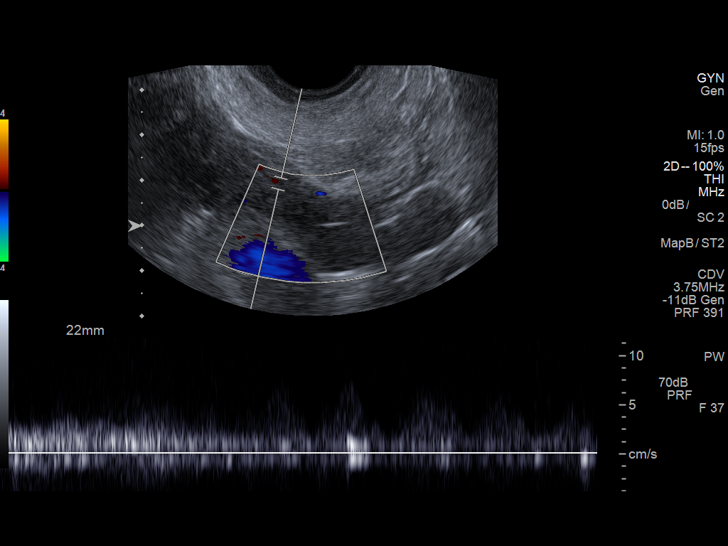
[im 89/97]
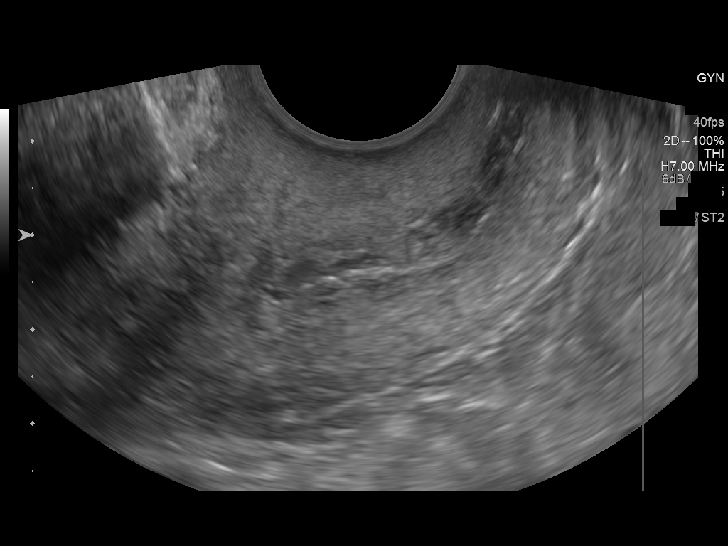
[im 97/97]
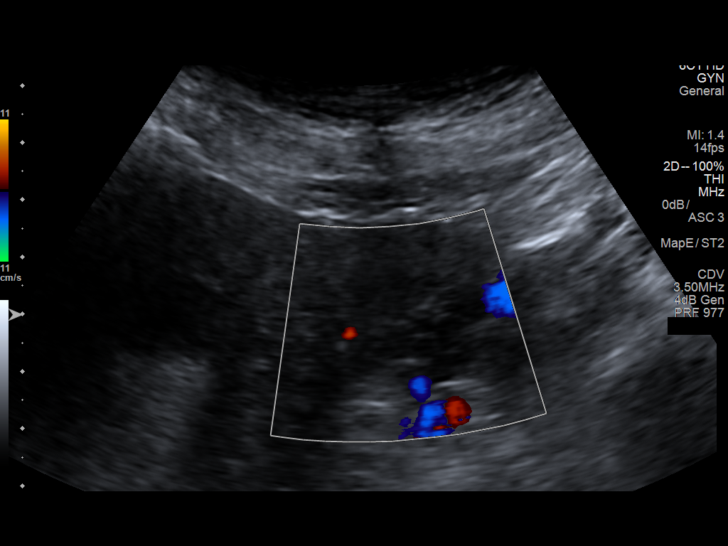

[13 of 25 positions shown; findings below may reference images not displayed]

FINDINGS: Uterus

Measurements: 9.9 x 4.9 x 6.6 cm. No fibroids or other mass
visualized.

Endometrium

Thickness: 4 mm. There is mobile echogenic material demonstrated
within the endometrial canal. Additionally within the endometrial
canal near the uterine fundus there is a 4 mm oval echogenic mass.

Right ovary

Measurements: 3.0 x 1.5 x 1.3 cm. Normal appearance/no adnexal mass.

Left ovary

Limited evaluation due to location within the pelvis and surrounding
mobile bowel.

Measurements: 2.7 x 1.6 x 2.0 cm. Normal appearance/no adnexal mass.

Pulsed Doppler evaluation of both ovaries demonstrates normal
low-resistance arterial and venous waveforms.

Other findings

No abnormal free fluid.
IMPRESSION: Somewhat limited evaluation of the left ovary due to the location
within the pelvis and extensive surrounding mobile bowel. Within the
above limitation, there is no sonographic evidence to suggest
torsion of either ovary.

There is a 4 mm mass within the endometrium near the uterine fundus
which may represent an endometrial polyp. Consider further
evaluation with sonohysterogram for confirmation prior to
hysteroscopy. Endometrial sampling should also be considered if
patient is at high risk for endometrial carcinoma. (Ref:
Radiological Reasoning: Algorithmic Workup of Abnormal Vaginal
Bleeding with Endovaginal Sonography and Sonohysterography. AJR
9003; 191:S68-73)

Additionally, the endometrial canal is distended with mobile
echogenic material most compatible with blood products.

## 2017-12-10 ENCOUNTER — Other Ambulatory Visit: Payer: Self-pay | Admitting: Obstetrics and Gynecology

## 2017-12-14 NOTE — Telephone Encounter (Signed)
Will send recall , as pt was to have an u/s last year, and didn't .  Omeprazole is unrelated, was last Rx'd in 2016.

## 2018-02-22 ENCOUNTER — Other Ambulatory Visit: Payer: Self-pay

## 2018-02-22 ENCOUNTER — Emergency Department (HOSPITAL_COMMUNITY)
Admission: EM | Admit: 2018-02-22 | Discharge: 2018-02-23 | Disposition: A | Discharge status: Home / Self Care | Payer: Medicare Other | Attending: Emergency Medicine | Admitting: Emergency Medicine

## 2018-02-22 ENCOUNTER — Encounter (HOSPITAL_COMMUNITY): Payer: Self-pay | Admitting: *Deleted

## 2018-02-22 DIAGNOSIS — R200 Anesthesia of skin: Secondary | ICD-10-CM | POA: Diagnosis present

## 2018-02-22 DIAGNOSIS — R202 Paresthesia of skin: Secondary | ICD-10-CM | POA: Diagnosis not present

## 2018-02-22 DIAGNOSIS — F17210 Nicotine dependence, cigarettes, uncomplicated: Secondary | ICD-10-CM | POA: Diagnosis not present

## 2018-02-22 NOTE — ED Triage Notes (Addendum)
Pt reports cutting am artery in her right pointer finger and had it repaired at [facility]. Pt reports that she feels like her finger is blue. Pt also reports she had some right ankle swelling yesterday.

## 2018-02-23 NOTE — Discharge Instructions (Addendum)
Please keep the wound clean and dry.  Please see Dr. Grandville Silos for hand specialty evaluation of the numbness, tingling, and pain of your finger.

## 2018-02-23 NOTE — ED Provider Notes (Addendum)
[facility] EMERGENCY DEPARTMENT Provider Note   CSN: [id number] Arrival date & time: 02/22/18  2118     History   Chief Complaint Chief Complaint  Patient presents with  . Wound Check    HPI [name] is a 47 y.o. female.  Patient is a 47 year old female who presents to the emergency department for a wound check concerning her finger.  The patient states that 10 days ago she cut her right index finger with a knife.  She states she fell on the knife.  The patient was seen at the [facility] emergency department.  She was told that she cut an artery.  She says she was not sure if she was told that this would heal on its own or she would need additional care.  She states however she now notices numbness from the suture line to the tip of the finger.  She is also noticed some discoloration.  She has not had any fever or chills related to the finger laceration.  There is been no pus like drainage or signs of infection.  She has not had any new injury since the laceration to the finger.     Past Medical History:  Diagnosis Date  . Arthritis    hands, knees  . Endometrial polyp   . Fibromyalgia   . Headache(784.0)   . Pregnancy   . SVD (spontaneous vaginal delivery)    x 1    Patient Active Problem List   Diagnosis Date Noted  . Menorrhagia with regular cycle 03/19/2017  . Adjustment disorder with mixed anxiety and depressed mood 02/21/2015  . Dysmenorrhea 01/08/2015  . Dyspareunia 01/08/2015  . Uterus retroversion 01/08/2015  . Sterilization 06/25/2012    Past Surgical History:  Procedure Laterality Date  . CESAREAN SECTION     x 4  . right thumb surgery     pins  . WISDOM TOOTH EXTRACTION       OB History    Gravida  7   Para  6   Term  6   Preterm      AB  1   Living  6     SAB  1   TAB      Ectopic      Multiple      Live Births  1            Home Medications    Prior to Admission medications   Medication Sig Start Date  End Date Taking? Authorizing Provider  fluticasone (FLONASE) 50 MCG/ACT nasal spray Place 1 spray into both nostrils daily as needed for allergies or rhinitis.    [provider]  IBU 800 MG tablet TAKE 1 TABLET BY MOUTH 3 TIMES DAILY AS NEEDED FOR PAIN. 04/13/17   Florian Buff, MD  norethindrone (MICRONOR,CAMILA,ERRIN) 0.35 MG tablet Take 1 tablet (0.35 mg total) by mouth daily. 04/27/17   Jonnie Kind, MD  omeprazole (PRILOSEC) 40 MG capsule TAKE ONE CAPSULE BY MOUTH DAILY. 12/14/17   Jonnie Kind, MD  ondansetron (ZOFRAN) 4 MG tablet TAKE (1) TABLET BY MOUTH EVERY (6) HOURS AS NEEDED FOR NAUSEA. 04/13/17   Florian Buff, MD  mirtazapine (REMERON) 15 MG tablet Take 1 tablet (15 mg total) by mouth at bedtime. Patient not taking: Reported on 06/27/2015 02/21/15 06/27/15  Cloria Spring, MD    Family History Family History  Problem Relation Age of Onset  . Hypertension Mother   . Diabetes Mother   .  Cancer Mother        lung  . CAD Father   . Heart failure Father   . Heart disease Father   . Seizures Son   . Diabetes Other   . Heart failure Other   . Hypertension Other   . Cancer Other     Social History Social History   Tobacco Use  . Smoking status: Current Every Day Smoker    Packs/day: 0.50    Years: 10.00    Pack years: 5.00    Types: Cigarettes  . Smokeless tobacco: Never Used  . Tobacco comment: 5-6 cigarettes a day  Substance Use Topics  . Alcohol use: No    Alcohol/week: 0.0 oz  . Drug use: No     Allergies   Aspirin; Nsaids; Latex; Morphine and related; Phenergan [promethazine]; and Penicillins   Review of Systems Review of Systems  Constitutional: Negative for activity change.       All ROS Neg except as noted in HPI  HENT: Negative for nosebleeds.   Eyes: Negative for photophobia and discharge.  Respiratory: Negative for cough, shortness of breath and wheezing.   Cardiovascular: Negative for chest pain and palpitations.    Gastrointestinal: Negative for abdominal pain and blood in stool.  Genitourinary: Negative for dysuria, frequency and hematuria.  Musculoskeletal: Negative for arthralgias, back pain and neck pain.  Skin: Negative.        Sutured finger laceration  Neurological: Positive for numbness. Negative for dizziness, seizures and speech difficulty.  Psychiatric/Behavioral: Negative for confusion and hallucinations.     Physical Exam Updated Vital Signs BP (!) 157/95 (BP Location: Left Arm)   Pulse 91   Temp 98.3 F (36.8 C) (Oral)   Resp 14   Ht 5\' 6"  (1.676 m)   Wt 90.7 kg (200 lb)   LMP 02/15/2018   SpO2 99%   BMI 32.28 kg/m   Physical Exam  Constitutional: She is oriented to person, place, and time. She appears well-developed and well-nourished.  Non-toxic appearance.  HENT:  Head: Normocephalic.  Right Ear: Tympanic membrane and external ear normal.  Left Ear: Tympanic membrane and external ear normal.  Eyes: Pupils are equal, round, and reactive to light. EOM and lids are normal.  Neck: Normal range of motion. Neck supple. Carotid bruit is not present.  Cardiovascular: Normal rate, regular rhythm, normal heart sounds, intact distal pulses and normal pulses.  Pulmonary/Chest: Breath sounds normal. No respiratory distress.  Abdominal: Soft. Bowel sounds are normal. There is no tenderness. There is no guarding.  Musculoskeletal:  There is good flexion and extension of the index finger of the right hand at the MP joint.  There is decreased flexion extension at the PIP and DIP due to pain.  Sutures remain in place.  Lymphadenopathy:       Head (right side): No submandibular adenopathy present.       Head (left side): No submandibular adenopathy present.    She has no cervical adenopathy.  Neurological: She is alert and oriented to person, place, and time. She has normal strength. A sensory deficit is present. No cranial nerve deficit.  Patient states that she has some sensation of  the distal portion of the index finger, but it is numb and tingling.  Skin: Skin is warm and dry.  Psychiatric: She has a normal mood and affect. Her speech is normal.  Nursing note and vitals reviewed.    ED Treatments / Results  Labs (all labs ordered are listed,  but only abnormal results are displayed) Labs Reviewed - No data to display  EKG None  Radiology No results found.  Procedures Procedures (including critical care time)  Medications Ordered in ED Medications - No data to display   Initial Impression / Assessment and Plan / ED Course  I have reviewed the triage vital signs and the nursing notes.  Pertinent labs & imaging results that were available during my care of the patient were reviewed by me and considered in my medical decision making (see chart for details).       Final Clinical Impressions(s) / ED Diagnoses MDM  Patient sustained a laceration to the index finger approximately 10 days ago.  She states that since that time she has been noticing numbness, tingling, and some pain in the area.  She has difficulty with flexion extension of the distal portion of the finger.  The patient does not recall being referred to orthopedic surgery.  We will refer the patient to Dr. Grandville Silos for orthopedic hand specialty surgery.   Final diagnoses:  Paresthesia of finger    ED Discharge Orders    None       Lily Kocher, PA-C 02/23/18 0025    Lily Kocher, PA-C 02/23/18 2751    Merryl Hacker, MD 02/23/18 [phone]

## 2018-03-14 ENCOUNTER — Other Ambulatory Visit: Payer: Self-pay

## 2018-03-14 ENCOUNTER — Encounter (HOSPITAL_COMMUNITY): Payer: Self-pay | Admitting: *Deleted

## 2018-03-14 ENCOUNTER — Emergency Department (HOSPITAL_COMMUNITY)
Admission: EM | Admit: 2018-03-14 | Discharge: 2018-03-14 | Disposition: A | Discharge status: Home / Self Care | Payer: Medicare Other | Attending: Emergency Medicine | Admitting: Emergency Medicine

## 2018-03-14 DIAGNOSIS — Z91040 Latex allergy status: Secondary | ICD-10-CM | POA: Diagnosis not present

## 2018-03-14 DIAGNOSIS — Z4802 Encounter for removal of sutures: Secondary | ICD-10-CM | POA: Diagnosis not present

## 2018-03-14 DIAGNOSIS — S61210D Laceration without foreign body of right index finger without damage to nail, subsequent encounter: Secondary | ICD-10-CM | POA: Diagnosis present

## 2018-03-14 DIAGNOSIS — Y33XXXD Other specified events, undetermined intent, subsequent encounter: Secondary | ICD-10-CM | POA: Diagnosis not present

## 2018-03-14 DIAGNOSIS — F17210 Nicotine dependence, cigarettes, uncomplicated: Secondary | ICD-10-CM | POA: Diagnosis not present

## 2018-03-14 MED ORDER — TRAMADOL HCL 50 MG PO TABS: 50.0000 mg | ORAL_TABLET | Freq: Four times a day (QID) | ORAL | 0 refills | Status: DC | PRN

## 2018-03-14 MED ORDER — ACETAMINOPHEN 500 MG PO TABS
1000.0000 mg | ORAL_TABLET | Freq: Once | ORAL | Status: AC
  Administered 2018-03-14: 1000 mg via ORAL
  Filled 2018-03-14: qty 2

## 2018-03-14 MED ORDER — TRAMADOL HCL 50 MG PO TABS
50.0000 mg | ORAL_TABLET | Freq: Once | ORAL | Status: DC
  Filled 2018-03-14: qty 1

## 2018-03-14 NOTE — ED Notes (Signed)
JI removed sutures as they were embedded and pt was impatient

## 2018-03-14 NOTE — ED Notes (Signed)
Pt reports here for suture removal  Sutures in for several weeks- did not return due to "no ride"  Wound well healed and skin over sutures

## 2018-03-14 NOTE — ED Triage Notes (Signed)
Pt here for suture removal from right pointer finger. Pt's sutures placed on 02/20/18 at [facility].

## 2018-03-14 NOTE — Discharge Instructions (Addendum)
Take tylenol 1 gram (1000 mg) every 6 hours if needed for pain relief.

## 2018-03-14 NOTE — ED Provider Notes (Signed)
[facility] EMERGENCY DEPARTMENT Provider Note   CSN: [id number] Arrival date & time: 03/14/18  1224     History   Chief Complaint Chief Complaint  Patient presents with  . Suture / Staple Removal    HPI [name] is a 48 y.o. female presenting for suture removal of 4 stitches that were placed in her right distal index finger on May 18 by an outside provider.  She was seen here several days after the sutures were placed with complaints of pain in the finger but also numbness along the incision line.  She was told she had a possible artery injury and also a tendon injury of the affected finger and was referred to orthopedics and was told she would need surgical repair but has had back to back illnesses including strep throat and a GI bug which caused her to have to reschedule her surgery twice.  She was then told her injury is too old for surgical repair.  Regardless, she is here today requesting suture removal.  She has persistent pain along the suture line, denies redness, swelling or drainage from the wound site.  She has continued difficulty bending her distal fingertip and also numbness distally and is concerned about this being a permanent problem, she is requesting another orthopedic referral for a second opinion.  The history is provided by the patient.    Past Medical History:  Diagnosis Date  . Arthritis    hands, knees  . Endometrial polyp   . Fibromyalgia   . Headache(784.0)   . Pregnancy   . SVD (spontaneous vaginal delivery)    x 1    Patient Active Problem List   Diagnosis Date Noted  . Menorrhagia with regular cycle 03/19/2017  . Adjustment disorder with mixed anxiety and depressed mood 02/21/2015  . Dysmenorrhea 01/08/2015  . Dyspareunia 01/08/2015  . Uterus retroversion 01/08/2015  . Sterilization 06/25/2012    Past Surgical History:  Procedure Laterality Date  . CESAREAN SECTION     x 4  . right thumb surgery     pins  . WISDOM TOOTH EXTRACTION        OB History    Gravida  7   Para  6   Term  6   Preterm      AB  1   Living  6     SAB  1   TAB      Ectopic      Multiple      Live Births  1            Home Medications    Prior to Admission medications   Medication Sig Start Date End Date Taking? Authorizing Provider  fluticasone (FLONASE) 50 MCG/ACT nasal spray Place 1 spray into both nostrils daily as needed for allergies or rhinitis.    [provider]  IBU 800 MG tablet TAKE 1 TABLET BY MOUTH 3 TIMES DAILY AS NEEDED FOR PAIN. 04/13/17   Florian Buff, MD  norethindrone (MICRONOR,CAMILA,ERRIN) 0.35 MG tablet Take 1 tablet (0.35 mg total) by mouth daily. 04/27/17   Jonnie Kind, MD  omeprazole (PRILOSEC) 40 MG capsule TAKE ONE CAPSULE BY MOUTH DAILY. 12/14/17   Jonnie Kind, MD  ondansetron (ZOFRAN) 4 MG tablet TAKE (1) TABLET BY MOUTH EVERY (6) HOURS AS NEEDED FOR NAUSEA. 04/13/17   Florian Buff, MD  traMADol (ULTRAM) 50 MG tablet Take 1 tablet (50 mg total) by mouth every 6 (six) hours as  needed. 03/14/18   Evalee Jefferson, PA-C    Family History Family History  Problem Relation Age of Onset  . Hypertension Mother   . Diabetes Mother   . Cancer Mother        lung  . CAD Father   . Heart failure Father   . Heart disease Father   . Seizures Son   . Diabetes Other   . Heart failure Other   . Hypertension Other   . Cancer Other     Social History Social History   Tobacco Use  . Smoking status: Current Every Day Smoker    Packs/day: 0.50    Years: 10.00    Pack years: 5.00    Types: Cigarettes  . Smokeless tobacco: Never Used  . Tobacco comment: 5-6 cigarettes a day  Substance Use Topics  . Alcohol use: No    Alcohol/week: 0.0 oz  . Drug use: No     Allergies   Aspirin; Nsaids; Latex; Morphine and related; Phenergan [promethazine]; Penicillins; and Toradol [ketorolac tromethamine]   Review of Systems Review of Systems  Constitutional: Negative for fever.    Musculoskeletal: Positive for arthralgias. Negative for joint swelling and myalgias.  Skin: Positive for wound.  Neurological: Positive for numbness. Negative for weakness.     Physical Exam Updated Vital Signs BP (!) 140/100 (BP Location: Left Arm)   Pulse 72   Temp (!) 97.4 F (36.3 C) (Oral)   Resp 18   Ht 5\' 6"  (1.676 m)   Wt 90.7 kg (200 lb)   LMP 02/15/2018   SpO2 95%   BMI 32.28 kg/m   Physical Exam  Constitutional: She is oriented to person, place, and time. She appears well-developed and well-nourished.  HENT:  Head: Normocephalic.  Cardiovascular: Normal rate.  Pulmonary/Chest: Effort normal.  Musculoskeletal: She exhibits tenderness. She exhibits no edema.  Neurological: She is alert and oriented to person, place, and time. No sensory deficit.  Skin: Laceration noted. No erythema.  Well-healed laceration right volar DIP joint.  There is no edema, erythema or drainage.  Decreased sensation along the suture line.  Patient has no ability to flex at the DIP joint.  Extension is intact.     ED Treatments / Results  Labs (all labs ordered are listed, but only abnormal results are displayed) Labs Reviewed - No data to display  EKG None  Radiology No results found.  Procedures Procedures (including critical care time)  SUTURE REMOVAL Performed by: Evalee Jefferson  Consent: Verbal consent obtained. Patient identity confirmed: provided demographic data Time out: Immediately prior to procedure a "time out" was called to verify the correct patient, procedure, equipment, support staff and site/side marked as required.  Location details: right index finger  Wound Appearance: clean  Sutures/Staples Removed: #4  Facility: sutures placed at outside facility Patient tolerance: Patient tolerated the procedure well with no immediate complications.     Medications Ordered in ED Medications  acetaminophen (TYLENOL) tablet 1,000 mg (1,000 mg Oral Given 03/14/18  1523)     Initial Impression / Assessment and Plan / ED Course  I have reviewed the triage vital signs and the nursing notes.  Pertinent labs & imaging results that were available during my care of the patient were reviewed by me and considered in my medical decision making (see chart for details).     Pt with now old laceration with presumptive flexor tendon injury.  She was given a referral to another orthopedist for additional guidance regarding her  deficit.  There is no signs of infection in the finger.  Pt appeared to have an exaggerated pain response to suture removal. Requested pain medicine.  Review of narcotic database positive for suboxone, not in current months.  Tylenol recommended.  Final Clinical Impressions(s) / ED Diagnoses   Final diagnoses:  Visit for suture removal       Landis Martins 03/14/18 1703    Nat Christen, MD 03/17/18 1019

## 2018-03-16 ENCOUNTER — Encounter (HOSPITAL_COMMUNITY): Payer: Self-pay | Admitting: *Deleted

## 2018-03-16 ENCOUNTER — Emergency Department (HOSPITAL_COMMUNITY): Payer: Medicare Other

## 2018-03-16 ENCOUNTER — Emergency Department (HOSPITAL_COMMUNITY)
Admission: EM | Admit: 2018-03-16 | Discharge: 2018-03-16 | Disposition: A | Discharge status: Home / Self Care | Payer: Medicare Other | Attending: Emergency Medicine | Admitting: Emergency Medicine

## 2018-03-16 ENCOUNTER — Other Ambulatory Visit: Payer: Self-pay

## 2018-03-16 DIAGNOSIS — F17210 Nicotine dependence, cigarettes, uncomplicated: Secondary | ICD-10-CM | POA: Diagnosis not present

## 2018-03-16 DIAGNOSIS — K529 Noninfective gastroenteritis and colitis, unspecified: Secondary | ICD-10-CM

## 2018-03-16 DIAGNOSIS — Z91040 Latex allergy status: Secondary | ICD-10-CM | POA: Insufficient documentation

## 2018-03-16 DIAGNOSIS — R1084 Generalized abdominal pain: Secondary | ICD-10-CM | POA: Diagnosis not present

## 2018-03-16 DIAGNOSIS — R109 Unspecified abdominal pain: Secondary | ICD-10-CM

## 2018-03-16 DIAGNOSIS — R112 Nausea with vomiting, unspecified: Secondary | ICD-10-CM

## 2018-03-16 DIAGNOSIS — R197 Diarrhea, unspecified: Secondary | ICD-10-CM

## 2018-03-16 DIAGNOSIS — Z79899 Other long term (current) drug therapy: Secondary | ICD-10-CM | POA: Insufficient documentation

## 2018-03-16 LAB — CBC WITH DIFFERENTIAL/PLATELET
BASOS ABS: 0 10*3/uL (ref 0.0–0.1)
Basophils Relative: 0 %
Eosinophils Absolute: 0.1 10*3/uL (ref 0.0–0.7)
Eosinophils Relative: 1 %
HEMATOCRIT: 46.4 % — AB (ref 36.0–46.0)
Hemoglobin: 15.3 g/dL — ABNORMAL HIGH (ref 12.0–15.0)
Lymphocytes Relative: 9 %
Lymphs Abs: 1.2 10*3/uL (ref 0.7–4.0)
MCH: 29.9 pg (ref 26.0–34.0)
MCHC: 33 g/dL (ref 30.0–36.0)
MCV: 90.8 fL (ref 78.0–100.0)
MONO ABS: 0.7 10*3/uL (ref 0.1–1.0)
MONOS PCT: 5 %
NEUTROS ABS: 10.8 10*3/uL — AB (ref 1.7–7.7)
Neutrophils Relative %: 85 %
Platelets: 199 10*3/uL (ref 150–400)
RBC: 5.11 MIL/uL (ref 3.87–5.11)
RDW: 12.7 % (ref 11.5–15.5)
WBC: 12.8 10*3/uL — ABNORMAL HIGH (ref 4.0–10.5)

## 2018-03-16 LAB — URINALYSIS, ROUTINE W REFLEX MICROSCOPIC
BILIRUBIN URINE: NEGATIVE
Glucose, UA: NEGATIVE mg/dL
Ketones, ur: NEGATIVE mg/dL
LEUKOCYTES UA: NEGATIVE
NITRITE: NEGATIVE
Protein, ur: NEGATIVE mg/dL
RBC / HPF: >50 RBC/hpf — ABNORMAL HIGH (ref 0–5)
Specific Gravity, Urine: 1.019 (ref 1.005–1.030)
pH: 5 (ref 5.0–8.0)

## 2018-03-16 LAB — LIPASE, BLOOD: LIPASE: 30 U/L (ref 11–51)

## 2018-03-16 LAB — COMPREHENSIVE METABOLIC PANEL
ALBUMIN: 3.5 g/dL (ref 3.5–5.0)
ALT: 11 U/L — AB (ref 14–54)
AST: 13 U/L — AB (ref 15–41)
Alkaline Phosphatase: 74 U/L (ref 38–126)
Anion gap: 8 (ref 5–15)
BUN: 8 mg/dL (ref 6–20)
CO2: 26 mmol/L (ref 22–32)
CREATININE: 0.81 mg/dL (ref 0.44–1.00)
Calcium: 8.8 mg/dL — ABNORMAL LOW (ref 8.9–10.3)
Chloride: 103 mmol/L (ref 101–111)
GFR calc Af Amer: >60 mL/min (ref >60)
GFR calc non Af Amer: >60 mL/min (ref >60)
Glucose, Bld: 146 mg/dL — ABNORMAL HIGH (ref 65–99)
POTASSIUM: 3.4 mmol/L — AB (ref 3.5–5.1)
Sodium: 137 mmol/L (ref 135–145)
Total Bilirubin: 1.1 mg/dL (ref 0.3–1.2)
Total Protein: 6.8 g/dL (ref 6.5–8.1)

## 2018-03-16 LAB — PREGNANCY, URINE: Preg Test, Ur: NEGATIVE

## 2018-03-16 MED ORDER — FAMOTIDINE IN NACL 20-0.9 MG/50ML-% IV SOLN
20.0000 mg | Freq: Once | INTRAVENOUS | Status: AC
Start: 2018-03-16 — End: 2018-03-16
  Administered 2018-03-16: 20 mg via INTRAVENOUS
  Filled 2018-03-16: qty 50

## 2018-03-16 MED ORDER — IOPAMIDOL (ISOVUE-300) INJECTION 61%
100.0000 mL | Freq: Once | INTRAVENOUS | Status: AC | PRN
  Administered 2018-03-16: 100 mL via INTRAVENOUS

## 2018-03-16 MED ORDER — DICYCLOMINE HCL 10 MG/ML IM SOLN
20.0000 mg | Freq: Once | INTRAMUSCULAR | Status: AC
  Administered 2018-03-16: 20 mg via INTRAMUSCULAR
  Filled 2018-03-16: qty 2

## 2018-03-16 MED ORDER — CIPROFLOXACIN HCL 500 MG PO TABS: 500.0000 mg | ORAL_TABLET | Freq: Two times a day (BID) | ORAL | 0 refills | Status: DC

## 2018-03-16 MED ORDER — CIPROFLOXACIN IN D5W 400 MG/200ML IV SOLN
400.0000 mg | Freq: Once | INTRAVENOUS | Status: AC
  Administered 2018-03-16: 400 mg via INTRAVENOUS
  Filled 2018-03-16: qty 200

## 2018-03-16 MED ORDER — OXYCODONE-ACETAMINOPHEN 5-325 MG PO TABS
1.0000 | ORAL_TABLET | Freq: Once | ORAL | Status: AC
  Administered 2018-03-16: 1 via ORAL
  Filled 2018-03-16: qty 1

## 2018-03-16 MED ORDER — POTASSIUM CHLORIDE CRYS ER 20 MEQ PO TBCR
40.0000 meq | EXTENDED_RELEASE_TABLET | Freq: Once | ORAL | Status: AC
  Administered 2018-03-16: 40 meq via ORAL
  Filled 2018-03-16: qty 2

## 2018-03-16 MED ORDER — SODIUM CHLORIDE 0.9 % IV BOLUS
1000.0000 mL | Freq: Once | INTRAVENOUS | Status: AC
Start: 2018-03-16 — End: 2018-03-16
  Administered 2018-03-16: 1000 mL via INTRAVENOUS

## 2018-03-16 MED ORDER — ONDANSETRON HCL 4 MG/2ML IJ SOLN
4.0000 mg | INTRAMUSCULAR | Status: DC | PRN
  Administered 2018-03-16: 4 mg via INTRAVENOUS
  Filled 2018-03-16: qty 2

## 2018-03-16 MED ORDER — METRONIDAZOLE IN NACL 5-0.79 MG/ML-% IV SOLN
500.0000 mg | Freq: Once | INTRAVENOUS | Status: AC
  Administered 2018-03-16: 500 mg via INTRAVENOUS
  Filled 2018-03-16: qty 100

## 2018-03-16 MED ORDER — HYDROCODONE-ACETAMINOPHEN 5-325 MG PO TABS: ORAL_TABLET | ORAL | 0 refills | Status: DC

## 2018-03-16 MED ORDER — METOCLOPRAMIDE HCL 10 MG PO TABS: 10.0000 mg | ORAL_TABLET | Freq: Three times a day (TID) | ORAL | 0 refills | Status: DC | PRN

## 2018-03-16 MED ORDER — METRONIDAZOLE 500 MG PO TABS: 500.0000 mg | ORAL_TABLET | Freq: Three times a day (TID) | ORAL | 0 refills | Status: DC

## 2018-03-16 NOTE — ED Notes (Signed)
Pt back from CT

## 2018-03-16 NOTE — ED Notes (Signed)
Notified CT that her urine was back. Stated they would come get her when they could, she would now be put behind the list.

## 2018-03-16 NOTE — ED Notes (Signed)
Pt tolerated fluid challenge. No vomiting or diarrhea noted.

## 2018-03-16 NOTE — ED Notes (Signed)
Pt states she does not need to urinate at this time, aware of DO  

## 2018-03-16 NOTE — Discharge Instructions (Signed)
Take the prescriptions as directed.  Increase your fluid intake (ie:  Gatoraide) for the next few days.  Eat a bland diet and advance to your regular diet slowly as you can tolerate it.   Avoid full strength juices, as well as milk and milk products until your diarrhea has resolved.   Call your regular medical doctor tomorrow to schedule a follow up appointment this week.  Return to the Emergency Department immediately sooner if worsening.

## 2018-03-16 NOTE — ED Triage Notes (Signed)
Pt c/o abdominal pain, n/v/d that started yesterday.

## 2018-03-16 NOTE — ED Provider Notes (Signed)
[facility] EMERGENCY DEPARTMENT Provider Note   CSN: [id number] Arrival date & time: 03/16/18  1424     History   Chief Complaint Chief Complaint  Patient presents with  . Abdominal Pain    HPI [name] is a 47 y.o. female.  HPI  Pt was seen at 1510.  Per pt, c/o gradual onset and persistence of multiple intermittent episodes of N/V/D that began yesterday.   Describes the stools as "watery."  Has been associated with generalized abd "pain." Describes the abd pain as "sharp." Denies CP/SOB, no back pain, no fevers, no black or blood in stools or emesis. Denies recent travel or antibiotic use.     Past Medical History:  Diagnosis Date  . Arthritis    hands, knees  . Endometrial polyp   . Fibromyalgia   . Headache(784.0)   . Pregnancy   . SVD (spontaneous vaginal delivery)    x 1    Patient Active Problem List   Diagnosis Date Noted  . Menorrhagia with regular cycle 03/19/2017  . Adjustment disorder with mixed anxiety and depressed mood 02/21/2015  . Dysmenorrhea 01/08/2015  . Dyspareunia 01/08/2015  . Uterus retroversion 01/08/2015  . Sterilization 06/25/2012    Past Surgical History:  Procedure Laterality Date  . CESAREAN SECTION     x 4  . right thumb surgery     pins  . WISDOM TOOTH EXTRACTION       OB History    Gravida  7   Para  6   Term  6   Preterm      AB  1   Living  6     SAB  1   TAB      Ectopic      Multiple      Live Births  1            Home Medications    Prior to Admission medications   Medication Sig Start Date End Date Taking? Authorizing Provider  fluticasone (FLONASE) 50 MCG/ACT nasal spray Place 1 spray into both nostrils daily as needed for allergies or rhinitis.    [provider]  IBU 800 MG tablet TAKE 1 TABLET BY MOUTH 3 TIMES DAILY AS NEEDED FOR PAIN. 04/13/17   Florian Buff, MD  omeprazole (PRILOSEC) 40 MG capsule TAKE ONE CAPSULE BY MOUTH DAILY. 12/14/17   Jonnie Kind, MD    ondansetron (ZOFRAN) 4 MG tablet TAKE (1) TABLET BY MOUTH EVERY (6) HOURS AS NEEDED FOR NAUSEA. 04/13/17   Florian Buff, MD  traMADol (ULTRAM) 50 MG tablet Take 1 tablet (50 mg total) by mouth every 6 (six) hours as needed. 03/14/18   Evalee Jefferson, PA-C    Family History Family History  Problem Relation Age of Onset  . Hypertension Mother   . Diabetes Mother   . Cancer Mother        lung  . CAD Father   . Heart failure Father   . Heart disease Father   . Seizures Son   . Diabetes Other   . Heart failure Other   . Hypertension Other   . Cancer Other     Social History Social History   Tobacco Use  . Smoking status: Current Every Day Smoker    Packs/day: 0.50    Years: 10.00    Pack years: 5.00    Types: Cigarettes  . Smokeless tobacco: Never Used  . Tobacco comment: 5-6 cigarettes a day  Substance Use Topics  . Alcohol use: No    Alcohol/week: 0.0 oz  . Drug use: No     Allergies   Aspirin; Nsaids; Latex; Morphine and related; Phenergan [promethazine]; Penicillins; and Toradol [ketorolac tromethamine]   Review of Systems Review of Systems ROS: Statement: All systems negative except as marked or noted in the HPI; Constitutional: Negative for fever and chills. ; ; Eyes: Negative for eye pain, redness and discharge. ; ; ENMT: Negative for ear pain, hoarseness, nasal congestion, sinus pressure and sore throat. ; ; Cardiovascular: Negative for chest pain, palpitations, diaphoresis, dyspnea and peripheral edema. ; ; Respiratory: Negative for cough, wheezing and stridor. ; ; Gastrointestinal: +N/V/D, abd pain. Negative for blood in stool, hematemesis, jaundice and rectal bleeding. . ; ; Genitourinary: Negative for dysuria, flank pain and hematuria. ; ; Musculoskeletal: Negative for back pain and neck pain. Negative for swelling and trauma.; ; Skin: Negative for pruritus, rash, abrasions, blisters, bruising and skin lesion.; ; Neuro: Negative for headache, lightheadedness and neck  stiffness. Negative for weakness, altered level of consciousness, altered mental status, extremity weakness, paresthesias, involuntary movement, seizure and syncope.      Physical Exam Updated Vital Signs BP (!) 143/96 (BP Location: Right Arm)   Pulse (!) 111   Temp 99 F (37.2 C) (Oral)   Resp 18   Ht 5\' 6"  (1.676 m)   Wt 90.7 kg (200 lb)   LMP 02/14/2018   SpO2 96%   BMI 32.28 kg/m   Physical Exam 1515: Physical examination:  Nursing notes reviewed; Vital signs and O2 SAT reviewed;  Constitutional: Well developed, Well nourished, Well hydrated, In no acute distress; Head:  Normocephalic, atraumatic; Eyes: EOMI, PERRL, No scleral icterus; ENMT: Mouth and pharynx normal, Mucous membranes moist; Neck: Supple, Full range of motion, No lymphadenopathy; Cardiovascular: Regular rate and rhythm, No gallop; Respiratory: Breath sounds clear & equal bilaterally, No wheezes.  Speaking full sentences with ease, Normal respiratory effort/excursion; Chest: Nontender, Movement normal; Abdomen: Soft, +diffuse tenderness to palp. No rebound or guarding. Nondistended, Normal bowel sounds; Genitourinary: No CVA tenderness; Extremities: Peripheral pulses normal, No tenderness, No edema, No calf edema or asymmetry.; Neuro: AA&Ox3, Major CN grossly intact.  Speech clear. No gross focal motor or sensory deficits in extremities.; Skin: Color normal, Warm, Dry.   ED Treatments / Results  Labs (all labs ordered are listed, but only abnormal results are displayed)   EKG None  Radiology    Procedures Procedures (including critical care time)  Medications Ordered in ED Medications  sodium chloride 0.9 % bolus 1,000 mL (has no administration in time range)  famotidine (PEPCID) IVPB 20 mg premix (has no administration in time range)  ondansetron (ZOFRAN) injection 4 mg (has no administration in time range)  dicyclomine (BENTYL) injection 20 mg (has no administration in time range)     Initial  Impression / Assessment and Plan / ED Course  I have reviewed the triage vital signs and the nursing notes.  Pertinent labs & imaging results that were available during my care of the patient were reviewed by me and considered in my medical decision making (see chart for details).  MDM Reviewed: previous chart, nursing note and vitals Reviewed previous: labs Interpretation: labs and CT scan    Results for orders placed or performed during the hospital encounter of 03/16/18  Comprehensive metabolic panel  Result Value Ref Range   Sodium 137 135 - 145 mmol/L   Potassium 3.4 (L) 3.5 - 5.1 mmol/L  Chloride 103 101 - 111 mmol/L   CO2 26 22 - 32 mmol/L   Glucose, Bld 146 (H) 65 - 99 mg/dL   BUN 8 6 - 20 mg/dL   Creatinine, Ser 0.81 0.44 - 1.00 mg/dL   Calcium 8.8 (L) 8.9 - 10.3 mg/dL   Total Protein 6.8 6.5 - 8.1 g/dL   Albumin 3.5 3.5 - 5.0 g/dL   AST 13 (L) 15 - 41 U/L   ALT 11 (L) 14 - 54 U/L   Alkaline Phosphatase 74 38 - 126 U/L   Total Bilirubin 1.1 0.3 - 1.2 mg/dL   GFR calc non Af Amer >60 >60 mL/min   GFR calc Af Amer >60 >60 mL/min   Anion gap 8 5 - 15  Lipase, blood  Result Value Ref Range   Lipase 30 11 - 51 U/L  CBC with Differential  Result Value Ref Range   WBC 12.8 (H) 4.0 - 10.5 K/uL   RBC 5.11 3.87 - 5.11 MIL/uL   Hemoglobin 15.3 (H) 12.0 - 15.0 g/dL   HCT 46.4 (H) 36.0 - 46.0 %   MCV 90.8 78.0 - 100.0 fL   MCH 29.9 26.0 - 34.0 pg   MCHC 33.0 30.0 - 36.0 g/dL   RDW 12.7 11.5 - 15.5 %   Platelets 199 150 - 400 K/uL   Neutrophils Relative % 85 %   Neutro Abs 10.8 (H) 1.7 - 7.7 K/uL   Lymphocytes Relative 9 %   Lymphs Abs 1.2 0.7 - 4.0 K/uL   Monocytes Relative 5 %   Monocytes Absolute 0.7 0.1 - 1.0 K/uL   Eosinophils Relative 1 %   Eosinophils Absolute 0.1 0.0 - 0.7 K/uL   Basophils Relative 0 %   Basophils Absolute 0.0 0.0 - 0.1 K/uL  Pregnancy, urine  Result Value Ref Range   Preg Test, Ur NEGATIVE NEGATIVE  Urinalysis, Routine w reflex  microscopic  Result Value Ref Range   Color, Urine YELLOW YELLOW   APPearance CLEAR CLEAR   Specific Gravity, Urine 1.019 1.005 - 1.030   pH 5.0 5.0 - 8.0   Glucose, UA NEGATIVE NEGATIVE mg/dL   Hgb urine dipstick LARGE (A) NEGATIVE   Bilirubin Urine NEGATIVE NEGATIVE   Ketones, ur NEGATIVE NEGATIVE mg/dL   Protein, ur NEGATIVE NEGATIVE mg/dL   Nitrite NEGATIVE NEGATIVE   Leukocytes, UA NEGATIVE NEGATIVE   RBC / HPF >50 (H) 0 - 5 RBC/hpf   WBC, UA 11-20 0 - 5 WBC/hpf   Bacteria, UA RARE (A) NONE SEEN   Squamous Epithelial / LPF 0-5 0 - 5   Mucus PRESENT    Ct Abdomen Pelvis W Contrast Result Date: 03/16/2018 CLINICAL DATA:  Abdominal pain. Nausea vomiting and diarrhea starting yesterday. EXAM: CT ABDOMEN AND PELVIS WITH CONTRAST TECHNIQUE: Multidetector CT imaging of the abdomen and pelvis was performed using the standard protocol following bolus administration of intravenous contrast. CONTRAST:  147mL ISOVUE-300 IOPAMIDOL (ISOVUE-300) INJECTION 61% COMPARISON:  12/04/2016 FINDINGS: Lower chest: Clear lung bases. Normal heart size without pericardial or pleural effusion. Hepatobiliary: Too small to characterize tiny low-density right hepatic lobe lesion was present on the prior and can be presumed benign. Normal gallbladder, without biliary ductal dilatation. Pancreas: Normal, without mass or ductal dilatation. Spleen: Normal in size, without focal abnormality. Adrenals/Urinary Tract: Normal adrenal glands. Moderate right renal atrophy. Normal appearance of the left kidney, without hydronephrosis. Decompressed urinary bladder. Stomach/Bowel: Gastric antral underdistention. The colon is fluid-filled, suggesting a diarrheal state. There is moderate  wall thickening of and submucosal edema throughout the mid and distal small bowel, including within the terminal ileum. The jejunum is relatively spared. Vascular/Lymphatic: Normal caliber of the aorta and branch vessels. Patent portal and mesenteric  veins. No abdominopelvic adenopathy. Reproductive: Tubal ligation.  Normal uterus and adnexa. Other: Small volume abdominopelvic fluid, likely secondary to the small bowel process. No free intraperitoneal air. Musculoskeletal: No acute osseous abnormality. IMPRESSION: 1. Enteritis involving the mid and distal small bowel, including the terminal ileum. Favor infection. Inflammatory bowel disease, such as Crohn disease, felt less likely given the length of bowel segment involved. 2. Small volume abdominopelvic ascites, likely secondary. Electronically Signed   By: Abigail Miyamoto M.D.   On: 03/16/2018 19:01    2125:  Pt has tol PO well while in the ED without N/V.  No stooling while in the ED.  VSS.  CT as above; IV abx given. Feels better and wants to go home now. Dx and testing d/w pt.  Questions answered.  Verb understanding, agreeable to d/c home with outpt f/u.     Final Clinical Impressions(s) / ED Diagnoses   Final diagnoses:  None    ED Discharge Orders    None       Francine Graven, DO 03/20/18 2335

## 2018-03-18 ENCOUNTER — Emergency Department (HOSPITAL_COMMUNITY)
Admission: EM | Admit: 2018-03-18 | Discharge: 2018-03-18 | Disposition: A | Discharge status: Home / Self Care | Payer: Medicare Other | Attending: Emergency Medicine | Admitting: Emergency Medicine

## 2018-03-18 ENCOUNTER — Other Ambulatory Visit: Payer: Self-pay

## 2018-03-18 ENCOUNTER — Encounter (HOSPITAL_COMMUNITY): Payer: Self-pay | Admitting: Emergency Medicine

## 2018-03-18 DIAGNOSIS — R1084 Generalized abdominal pain: Secondary | ICD-10-CM | POA: Diagnosis present

## 2018-03-18 DIAGNOSIS — Z91040 Latex allergy status: Secondary | ICD-10-CM | POA: Insufficient documentation

## 2018-03-18 DIAGNOSIS — K529 Noninfective gastroenteritis and colitis, unspecified: Secondary | ICD-10-CM | POA: Insufficient documentation

## 2018-03-18 DIAGNOSIS — F17210 Nicotine dependence, cigarettes, uncomplicated: Secondary | ICD-10-CM | POA: Insufficient documentation

## 2018-03-18 LAB — COMPREHENSIVE METABOLIC PANEL
ALT: 12 U/L — ABNORMAL LOW (ref 14–54)
AST: 13 U/L — ABNORMAL LOW (ref 15–41)
Albumin: 3.4 g/dL — ABNORMAL LOW (ref 3.5–5.0)
Alkaline Phosphatase: 63 U/L (ref 38–126)
Anion gap: 9 (ref 5–15)
BUN: 9 mg/dL (ref 6–20)
CO2: 26 mmol/L (ref 22–32)
Calcium: 8.8 mg/dL — ABNORMAL LOW (ref 8.9–10.3)
Chloride: 102 mmol/L (ref 101–111)
Creatinine, Ser: 0.81 mg/dL (ref 0.44–1.00)
GFR calc Af Amer: >60 mL/min (ref >60)
GFR calc non Af Amer: >60 mL/min (ref >60)
Glucose, Bld: 118 mg/dL — ABNORMAL HIGH (ref 65–99)
Potassium: 3.4 mmol/L — ABNORMAL LOW (ref 3.5–5.1)
Sodium: 137 mmol/L (ref 135–145)
Total Bilirubin: 0.8 mg/dL (ref 0.3–1.2)
Total Protein: 6.8 g/dL (ref 6.5–8.1)

## 2018-03-18 LAB — CBC
HCT: 47.3 % — ABNORMAL HIGH (ref 36.0–46.0)
Hemoglobin: 15.5 g/dL — ABNORMAL HIGH (ref 12.0–15.0)
MCH: 29.8 pg (ref 26.0–34.0)
MCHC: 32.8 g/dL (ref 30.0–36.0)
MCV: 91 fL (ref 78.0–100.0)
Platelets: 217 10*3/uL (ref 150–400)
RBC: 5.2 MIL/uL — ABNORMAL HIGH (ref 3.87–5.11)
RDW: 12.6 % (ref 11.5–15.5)
WBC: 15 10*3/uL — ABNORMAL HIGH (ref 4.0–10.5)

## 2018-03-18 LAB — LIPASE, BLOOD: Lipase: 25 U/L (ref 11–51)

## 2018-03-18 MED ORDER — ONDANSETRON 4 MG PO TBDP: 4.0000 mg | ORAL_TABLET | Freq: Three times a day (TID) | ORAL | 0 refills | Status: DC | PRN

## 2018-03-18 MED ORDER — SODIUM CHLORIDE 0.9 % IV BOLUS
1000.0000 mL | Freq: Once | INTRAVENOUS | Status: AC
  Administered 2018-03-18: 1000 mL via INTRAVENOUS

## 2018-03-18 MED ORDER — ONDANSETRON HCL 4 MG/2ML IJ SOLN
4.0000 mg | Freq: Once | INTRAMUSCULAR | Status: AC
  Administered 2018-03-18: 4 mg via INTRAVENOUS
  Filled 2018-03-18: qty 2

## 2018-03-18 MED ORDER — FENTANYL CITRATE (PF) 100 MCG/2ML IJ SOLN
25.0000 ug | Freq: Once | INTRAMUSCULAR | Status: AC
  Administered 2018-03-18: 25 ug via INTRAVENOUS
  Filled 2018-03-18: qty 2

## 2018-03-18 NOTE — ED Triage Notes (Addendum)
Patient c/o generalized abd pain with nausea, vomiting, diarrhea, and fever/chills. Denies any urinary symptoms. Per patient symptoms x2 days. Patient seen here in ER 2 days ago and diagnosed with enteritis- patient prescribed cipro 500mg , hydrocodone 5/325mg , metronidazole 500mg , and reglan 10mg . Per patient no relief with medications.

## 2018-03-18 NOTE — ED Provider Notes (Signed)
[facility] Provider Note   CSN: [id number] Arrival date & time: 03/18/18  1157     History   Chief Complaint Chief Complaint  Patient presents with  . Abdominal Pain    HPI [name] is a 47 y.o. female with a past medical history of fibromyalgia, who presents to ED for evaluation of 4-day history of ongoing generalized abdominal pain, nausea, vomiting, diarrhea and chills.  She was seen and evaluated here in the ED 2 days ago and was diagnosed with CT findings consistent with enteritis.  She was given ciprofloxacin, Flagyl, Reglan and hydrocodone.  She states that she has not had any improvement with these medications.  States anytime she tries to eat or drink anything she vomits it up.  Describes the abdominal pain as sharp and generalized.  Last emesis was prior to arrival and last bowel movement was this morning.  Denies any hematochezia, melena or hematemesis.  No sick contacts with similar symptoms.  Denies any urinary symptoms, vaginal complaints, fever, recent travel.  Reports compliance with the medications that were given to her.  HPI  Past Medical History:  Diagnosis Date  . Arthritis    hands, knees  . Endometrial polyp   . Fibromyalgia   . Headache(784.0)   . Pregnancy   . SVD (spontaneous vaginal delivery)    x 1    Patient Active Problem List   Diagnosis Date Noted  . Menorrhagia with regular cycle 03/19/2017  . Adjustment disorder with mixed anxiety and depressed mood 02/21/2015  . Dysmenorrhea 01/08/2015  . Dyspareunia 01/08/2015  . Uterus retroversion 01/08/2015  . Sterilization 06/25/2012    Past Surgical History:  Procedure Laterality Date  . CESAREAN SECTION     x 4  . right thumb surgery     pins  . WISDOM TOOTH EXTRACTION       OB History    Gravida  7   Para  6   Term  6   Preterm      AB  1   Living  6     SAB  1   TAB      Ectopic      Multiple      Live Births  1            Home  Medications    Prior to Admission medications   Medication Sig Start Date End Date Taking? Authorizing Provider  ciprofloxacin (CIPRO) 500 MG tablet Take 1 tablet (500 mg total) by mouth 2 (two) times daily. 03/16/18  Yes Francine Graven, DO  fluticasone (FLONASE) 50 MCG/ACT nasal spray Place 1 spray into both nostrils daily as needed for allergies or rhinitis.   Yes [provider]  HYDROcodone-acetaminophen (NORCO/VICODIN) 5-325 MG tablet 1 or 2 tabs PO q6 hours prn pain 03/16/18  Yes Francine Graven, DO  metoCLOPramide (REGLAN) 10 MG tablet Take 1 tablet (10 mg total) by mouth every 8 (eight) hours as needed for nausea or vomiting. 03/16/18  Yes Francine Graven, DO  metroNIDAZOLE (FLAGYL) 500 MG tablet Take 1 tablet (500 mg total) by mouth 3 (three) times daily. 03/16/18  Yes Francine Graven, DO  omeprazole (PRILOSEC) 40 MG capsule TAKE ONE CAPSULE BY MOUTH DAILY. Patient taking differently: TAKE ONE CAPSULE BY MOUTH DAILY AS NEEDED FOR ACID REFLUX 12/14/17  Yes Jonnie Kind, MD  ondansetron (ZOFRAN) 4 MG tablet TAKE (1) TABLET BY MOUTH EVERY (6) HOURS AS NEEDED FOR NAUSEA. 04/13/17  Yes Eure,  Mertie Clause, MD  ondansetron (ZOFRAN ODT) 4 MG disintegrating tablet Take 1 tablet (4 mg total) by mouth every 8 (eight) hours as needed for nausea or vomiting. 03/18/18   Delia Heady, PA-C    Family History Family History  Problem Relation Age of Onset  . Hypertension Mother   . Diabetes Mother   . Cancer Mother        lung  . CAD Father   . Heart failure Father   . Heart disease Father   . Seizures Son   . Diabetes Other   . Heart failure Other   . Hypertension Other   . Cancer Other     Social History Social History   Tobacco Use  . Smoking status: Current Every Day Smoker    Packs/day: 0.50    Years: 10.00    Pack years: 5.00    Types: Cigarettes  . Smokeless tobacco: Never Used  . Tobacco comment: 5-6 cigarettes a day  Substance Use Topics  . Alcohol use: No     Alcohol/week: 0.0 oz  . Drug use: No     Allergies   Aspirin; Nsaids; Latex; Morphine and related; Phenergan [promethazine]; Penicillins; and Toradol [ketorolac tromethamine]   Review of Systems Review of Systems  Constitutional: Positive for appetite change and chills. Negative for fever.  HENT: Negative for ear pain, rhinorrhea, sneezing and sore throat.   Eyes: Negative for photophobia and visual disturbance.  Respiratory: Negative for cough, chest tightness, shortness of breath and wheezing.   Cardiovascular: Negative for chest pain and palpitations.  Gastrointestinal: Positive for abdominal pain, diarrhea, nausea and vomiting. Negative for blood in stool and constipation.  Genitourinary: Negative for dysuria, hematuria and urgency.  Musculoskeletal: Negative for myalgias.  Skin: Negative for rash.  Neurological: Negative for dizziness, weakness and light-headedness.     Physical Exam Updated Vital Signs BP 122/67   Pulse 91   Temp 99.1 F (37.3 C) (Oral)   Resp 18   Ht 5\' 6"  (1.676 m)   Wt 90.7 kg (200 lb)   LMP 03/16/2018   SpO2 96%   BMI 32.28 kg/m   Physical Exam  Constitutional: She appears well-developed and well-nourished. No distress.  Does not appear dehydrated. NAD. Nontoxic appearing.  HENT:  Head: Normocephalic and atraumatic.  Nose: Nose normal.  Eyes: Conjunctivae and EOM are normal. Left eye exhibits no discharge. No scleral icterus.  Neck: Normal range of motion. Neck supple.  Cardiovascular: Normal rate, regular rhythm, normal heart sounds and intact distal pulses. Exam reveals no gallop and no friction rub.  No murmur heard. Pulmonary/Chest: Effort normal and breath sounds normal. No respiratory distress.  Abdominal: Soft. Bowel sounds are normal. She exhibits no distension. There is generalized tenderness. There is no guarding.  Musculoskeletal: Normal range of motion. She exhibits no edema.  Neurological: She is alert. She exhibits normal  muscle tone. Coordination normal.  Skin: Skin is warm and dry. No rash noted.  Psychiatric: She has a normal mood and affect.  Nursing note and vitals reviewed.    ED Treatments / Results  Labs (all labs ordered are listed, but only abnormal results are displayed) Labs Reviewed  COMPREHENSIVE METABOLIC PANEL - Abnormal; Notable for the following components:      Result Value   Potassium 3.4 (*)    Glucose, Bld 118 (*)    Calcium 8.8 (*)    Albumin 3.4 (*)    AST 13 (*)    ALT 12 (*)  All other components within normal limits  CBC - Abnormal; Notable for the following components:   WBC 15.0 (*)    RBC 5.20 (*)    Hemoglobin 15.5 (*)    HCT 47.3 (*)    All other components within normal limits  LIPASE, BLOOD    EKG None  Radiology Ct Abdomen Pelvis W Contrast  Result Date: 03/16/2018 CLINICAL DATA:  Abdominal pain. Nausea vomiting and diarrhea starting yesterday. EXAM: CT ABDOMEN AND PELVIS WITH CONTRAST TECHNIQUE: Multidetector CT imaging of the abdomen and pelvis was performed using the standard protocol following bolus administration of intravenous contrast. CONTRAST:  143mL ISOVUE-300 IOPAMIDOL (ISOVUE-300) INJECTION 61% COMPARISON:  12/04/2016 FINDINGS: Lower chest: Clear lung bases. Normal heart size without pericardial or pleural effusion. Hepatobiliary: Too small to characterize tiny low-density right hepatic lobe lesion was present on the prior and can be presumed benign. Normal gallbladder, without biliary ductal dilatation. Pancreas: Normal, without mass or ductal dilatation. Spleen: Normal in size, without focal abnormality. Adrenals/Urinary Tract: Normal adrenal glands. Moderate right renal atrophy. Normal appearance of the left kidney, without hydronephrosis. Decompressed urinary bladder. Stomach/Bowel: Gastric antral underdistention. The colon is fluid-filled, suggesting a diarrheal state. There is moderate wall thickening of and submucosal edema throughout the mid  and distal small bowel, including within the terminal ileum. The jejunum is relatively spared. Vascular/Lymphatic: Normal caliber of the aorta and branch vessels. Patent portal and mesenteric veins. No abdominopelvic adenopathy. Reproductive: Tubal ligation.  Normal uterus and adnexa. Other: Small volume abdominopelvic fluid, likely secondary to the small bowel process. No free intraperitoneal air. Musculoskeletal: No acute osseous abnormality. IMPRESSION: 1. Enteritis involving the mid and distal small bowel, including the terminal ileum. Favor infection. Inflammatory bowel disease, such as Crohn disease, felt less likely given the length of bowel segment involved. 2. Small volume abdominopelvic ascites, likely secondary. Electronically Signed   By: Abigail Miyamoto M.D.   On: 03/16/2018 19:01    Procedures Procedures (including critical care time)  Medications Ordered in ED Medications  sodium chloride 0.9 % bolus 1,000 mL (1,000 mLs Intravenous New Bag/Given 03/18/18 1345)  ondansetron (ZOFRAN) injection 4 mg (4 mg Intravenous Given 03/18/18 1345)  fentaNYL (SUBLIMAZE) injection 25 mcg (25 mcg Intravenous Given 03/18/18 1345)     Initial Impression / Assessment and Plan / ED Course  I have reviewed the triage vital signs and the nursing notes.  Pertinent labs & imaging results that were available during my care of the patient were reviewed by me and considered in my medical decision making (see chart for details).     47 year old female with a past medical history of fibromyalgia presents for 4-day history of ongoing generalized abdominal pain, nausea, vomiting, diarrhea and chills.  She was seen and evaluated here in the ED 2 days ago and had CT findings consistent with enteritis.  She was given ciprofloxacin, metronidazole, Reglan and hydrocodone.  She states that she has not had any improvement in his symptoms.  Continues to have nonbloody, nonbilious emesis and diarrhea.  States she cannot keep  the medication down.  Denies any recent travel, fever, urinary symptoms or vaginal complaints.  On physical exam patient is overall well-appearing.  She does have some generalized abdominal tenderness to palpation with no rebound or guarding noted.  She does not appear dehydrated.  Patient is unsure which medication is what so she may not be a very accurate historian.  Repeat lab work consistent with lab work 2 days ago.  Increasing leukocytosis at 15  which could be part of her disease process.  I did educate her that her symptoms may likely go on for several more days.  CT findings did have concern for possibility of inflammatory colitis such as Crohn's disease.  At this point I think it is too early for her to determine whether or not the antibiotics are working.  It may be useful for her to switch to a Zofran ODT instead of Reglan to help with her nausea.  She believes that part of the problem is swallowing the pills.  She was given fluids, Zofran and pain medication here with improvement in her symptoms.  Vital signs remained stable.  Will give her contact information for GI follow-up if symptoms persist for further imaging as warranted.  Patient is agreeable to this plan.  Advised to return to ED for any severe worsening symptoms.  Portions of this note were generated with [employer]. Dictation errors may occur despite best attempts at proofreading.   Final Clinical Impressions(s) / ED Diagnoses   Final diagnoses:  Enteritis    ED Discharge Orders        Ordered    ondansetron (ZOFRAN ODT) 4 MG disintegrating tablet  Every 8 hours PRN     03/18/18 1401       Delia Heady, PA-C 03/18/18 1423    Virgel Manifold, MD 03/19/18 1410

## 2018-03-18 NOTE — Discharge Instructions (Addendum)
Your lab work today was similar to your lab work 2 days ago. Please continue taking the medications prescribed to you including your ciprofloxacin, metronidazole and hydrocodone. You can instead take the Zofran I am prescribing to you today instead of the Reglan. It may take a few days for your pain to improve. If your symptoms do not improve in 1 week please follow-up with a GI specialist listed below for a possible colonoscopy or further evaluation. Follow-up at the wellness center for further evaluation.

## 2018-03-19 ENCOUNTER — Encounter (HOSPITAL_COMMUNITY): Payer: Self-pay | Admitting: Emergency Medicine

## 2018-03-19 ENCOUNTER — Emergency Department (HOSPITAL_COMMUNITY)
Admission: EM | Admit: 2018-03-19 | Discharge: 2018-03-20 | Disposition: A | Discharge status: Home / Self Care | Payer: Medicare Other | Attending: Emergency Medicine | Admitting: Emergency Medicine

## 2018-03-19 DIAGNOSIS — R109 Unspecified abdominal pain: Secondary | ICD-10-CM | POA: Diagnosis present

## 2018-03-19 DIAGNOSIS — F17210 Nicotine dependence, cigarettes, uncomplicated: Secondary | ICD-10-CM | POA: Diagnosis not present

## 2018-03-19 DIAGNOSIS — Z79899 Other long term (current) drug therapy: Secondary | ICD-10-CM | POA: Insufficient documentation

## 2018-03-19 DIAGNOSIS — R197 Diarrhea, unspecified: Secondary | ICD-10-CM | POA: Diagnosis not present

## 2018-03-19 DIAGNOSIS — R112 Nausea with vomiting, unspecified: Secondary | ICD-10-CM | POA: Diagnosis not present

## 2018-03-19 LAB — COMPREHENSIVE METABOLIC PANEL
ALBUMIN: 3.1 g/dL — AB (ref 3.5–5.0)
ALT: 16 U/L (ref 14–54)
AST: 17 U/L (ref 15–41)
Alkaline Phosphatase: 56 U/L (ref 38–126)
Anion gap: 9 (ref 5–15)
BUN: 6 mg/dL (ref 6–20)
CHLORIDE: 104 mmol/L (ref 101–111)
CO2: 23 mmol/L (ref 22–32)
Calcium: 8.7 mg/dL — ABNORMAL LOW (ref 8.9–10.3)
Creatinine, Ser: 0.74 mg/dL (ref 0.44–1.00)
GFR calc Af Amer: >60 mL/min (ref >60)
GFR calc non Af Amer: >60 mL/min (ref >60)
GLUCOSE: 138 mg/dL — AB (ref 65–99)
POTASSIUM: 3.2 mmol/L — AB (ref 3.5–5.1)
SODIUM: 136 mmol/L (ref 135–145)
TOTAL PROTEIN: 5.9 g/dL — AB (ref 6.5–8.1)
Total Bilirubin: 0.6 mg/dL (ref 0.3–1.2)

## 2018-03-19 LAB — CBC
HEMATOCRIT: 43.9 % (ref 36.0–46.0)
HEMOGLOBIN: 14.6 g/dL (ref 12.0–15.0)
MCH: 29.7 pg (ref 26.0–34.0)
MCHC: 33.3 g/dL (ref 30.0–36.0)
MCV: 89.4 fL (ref 78.0–100.0)
Platelets: 229 10*3/uL (ref 150–400)
RBC: 4.91 MIL/uL (ref 3.87–5.11)
RDW: 12.2 % (ref 11.5–15.5)
WBC: 10.6 10*3/uL — ABNORMAL HIGH (ref 4.0–10.5)

## 2018-03-19 LAB — URINALYSIS, ROUTINE W REFLEX MICROSCOPIC
Bacteria, UA: NONE SEEN
Bilirubin Urine: NEGATIVE
Glucose, UA: NEGATIVE mg/dL
Ketones, ur: 80 mg/dL — AB
Leukocytes, UA: NEGATIVE
Nitrite: NEGATIVE
PROTEIN: 30 mg/dL — AB
SPECIFIC GRAVITY, URINE: 1.026 (ref 1.005–1.030)
pH: 5 (ref 5.0–8.0)

## 2018-03-19 LAB — I-STAT BETA HCG BLOOD, ED (MC, WL, AP ONLY)

## 2018-03-19 LAB — LIPASE, BLOOD: Lipase: 32 U/L (ref 11–51)

## 2018-03-19 NOTE — ED Triage Notes (Signed)
Patient to ED for 4th time for N/V/D and abd pain over the last week - had CT scan on 6/11 that showed enteritis. Patient states she's given medications and fluids, is fine, goes home, and then the symptoms come right back. Patient in no apparent distress at this time.

## 2018-03-19 NOTE — ED Provider Notes (Signed)
Patient placed in Quick Look pathway, seen and evaluated   Chief Complaint: Abdominal pain  HPI:   Presents with complaint of epigastric abdominal pain for the past week.  Pain is cramping, intermittent, and radiates throughout the abdomen.  Accompanied by nausea, nonbloody nonbilious vomiting, and diarrhea.  Endorses 4-5 episodes of emesis and 4-5 nonbloody, watery stools.  Denies fever.  ROS: Abdominal pain   Physical Exam:   Gen: No distress  Neuro: Awake and Alert  Skin: Warm    Focused Exam:   No diaphoresis.  No pallor.  Pulmonary: No increased work of breathing.  Speaks in full sentences without difficulty. No tachypnea.  Cardiac: Normal rate and regular. Peripheral pulses intact.  Abdominal: Tenderness throughout the abdomen, but worse in epigastric region.  No peritoneal signs.  No rebound tenderness.  No guarding.  Bowel sounds increased.  Initiation of care has begun. The patient has been counseled on the process, plan, and necessity for staying for the completion/evaluation, and the remainder of the medical screening examination   [name] 03/19/18 1941    Lacretia Leigh, MD 03/21/18 [phone]

## 2018-03-20 DIAGNOSIS — R112 Nausea with vomiting, unspecified: Secondary | ICD-10-CM | POA: Diagnosis not present

## 2018-03-20 MED ORDER — DICYCLOMINE HCL 10 MG/ML IM SOLN
20.0000 mg | Freq: Once | INTRAMUSCULAR | Status: AC
  Administered 2018-03-20: 20 mg via INTRAMUSCULAR
  Filled 2018-03-20: qty 2

## 2018-03-20 MED ORDER — METRONIDAZOLE 50 MG/ML ORAL SUSPENSION: 500.0000 mg | Freq: Two times a day (BID) | ORAL | 0 refills | Status: DC

## 2018-03-20 MED ORDER — HYDROCODONE-ACETAMINOPHEN 7.5-325 MG/15ML PO SOLN: 15.0000 mL | Freq: Three times a day (TID) | ORAL | 0 refills | Status: DC | PRN

## 2018-03-20 MED ORDER — GI COCKTAIL ~~LOC~~
30.0000 mL | Freq: Once | ORAL | Status: AC
  Administered 2018-03-20: 30 mL via ORAL
  Filled 2018-03-20: qty 30

## 2018-03-20 MED ORDER — FENTANYL CITRATE (PF) 100 MCG/2ML IJ SOLN
25.0000 ug | Freq: Once | INTRAMUSCULAR | Status: AC
  Administered 2018-03-20: 25 ug via INTRAVENOUS
  Filled 2018-03-20: qty 2

## 2018-03-20 MED ORDER — CIPROFLOXACIN 500 MG/5ML (10%) PO SUSR: 500.0000 mg | Freq: Two times a day (BID) | ORAL | 0 refills | Status: DC

## 2018-03-20 MED ORDER — ONDANSETRON HCL 4 MG/2ML IJ SOLN
4.0000 mg | Freq: Once | INTRAMUSCULAR | Status: AC
  Administered 2018-03-20: 4 mg via INTRAVENOUS
  Filled 2018-03-20: qty 2

## 2018-03-20 MED ORDER — SODIUM CHLORIDE 0.9 % IV BOLUS
1000.0000 mL | Freq: Once | INTRAVENOUS | Status: AC
Start: 2018-03-20 — End: 2018-03-20
  Administered 2018-03-20: 1000 mL via INTRAVENOUS

## 2018-03-20 MED ORDER — ONDANSETRON 4 MG PO TBDP: 4.0000 mg | ORAL_TABLET | Freq: Three times a day (TID) | ORAL | 0 refills | Status: DC | PRN

## 2018-03-20 MED ORDER — SODIUM CHLORIDE 0.9 % IV BOLUS
1000.0000 mL | Freq: Once | INTRAVENOUS | Status: AC
  Administered 2018-03-20: 1000 mL via INTRAVENOUS

## 2018-03-20 NOTE — ED Provider Notes (Signed)
[facility]PART[facility] Provider Note   CSN: [id number] Arrival date & time: 03/19/18  1712     History   Chief Complaint Chief Complaint  Patient presents with  . Abdominal Pain    HPI [name] is a 47 y.o. female.  The history is provided by the patient and medical records.  Abdominal Pain   Associated symptoms include diarrhea, nausea and vomiting.    47 year old female with history of arthritis, fibromyalgia, adjustment disorder, dysmenorrhea, presenting to the ED for 3rd visit in 5 days for abdominal pain with nausea, vomiting, and diarrhea.  She had a CT scan on 03/16/2018 which showed enteritis involving the mid and distal small bowel.  This was favored to represent infection.  She was started on antibiotics as well as symptomatic management, however reports she does not feel any better.  Reports she has had difficulty taking her medications at home as she vomits up the pills as soon as she swallows them.  She has not had any fever or chills.  She continues to report some ongoing abdominal cramping, throughout her lower abdomen mostly but starting to become diffuse.  States pain seems to come in waves and feels like "contractions".  States she is seen in the ED given fluids and feels better but then begins feeling worse when she goes home again.  She does not have a primary care doctor.  Does not have a GI doctor either.  She has not tried any other medications or home remedies prior to arrival.  Past Medical History:  Diagnosis Date  . Arthritis    hands, knees  . Endometrial polyp   . Fibromyalgia   . Headache(784.0)   . Pregnancy   . SVD (spontaneous vaginal delivery)    x 1    Patient Active Problem List   Diagnosis Date Noted  . Menorrhagia with regular cycle 03/19/2017  . Adjustment disorder with mixed anxiety and depressed mood 02/21/2015  . Dysmenorrhea 01/08/2015  . Dyspareunia 01/08/2015  . Uterus retroversion 01/08/2015  .  Sterilization 06/25/2012    Past Surgical History:  Procedure Laterality Date  . CESAREAN SECTION     x 4  . right thumb surgery     pins  . WISDOM TOOTH EXTRACTION       OB History    Gravida  7   Para  6   Term  6   Preterm      AB  1   Living  6     SAB  1   TAB      Ectopic      Multiple      Live Births  1            Home Medications    Prior to Admission medications   Medication Sig Start Date End Date Taking? Authorizing Provider  ciprofloxacin (CIPRO) 500 MG tablet Take 1 tablet (500 mg total) by mouth 2 (two) times daily. 03/16/18  Yes Francine Graven, DO  fluticasone (FLONASE) 50 MCG/ACT nasal spray Place 1 spray into both nostrils daily as needed for allergies or rhinitis.   Yes [provider]  HYDROcodone-acetaminophen (NORCO/VICODIN) 5-325 MG tablet 1 or 2 tabs PO q6 hours prn pain 03/16/18  Yes Francine Graven, DO  metoCLOPramide (REGLAN) 10 MG tablet Take 1 tablet (10 mg total) by mouth every 8 (eight) hours as needed for nausea or vomiting. 03/16/18  Yes Francine Graven, DO  metroNIDAZOLE (FLAGYL) 500 MG tablet  Take 1 tablet (500 mg total) by mouth 3 (three) times daily. 03/16/18  Yes Francine Graven, DO  omeprazole (PRILOSEC) 40 MG capsule TAKE ONE CAPSULE BY MOUTH DAILY. Patient taking differently: TAKE ONE CAPSULE BY MOUTH DAILY AS NEEDED FOR ACID REFLUX 12/14/17  Yes Jonnie Kind, MD  ondansetron (ZOFRAN ODT) 4 MG disintegrating tablet Take 1 tablet (4 mg total) by mouth every 8 (eight) hours as needed for nausea or vomiting. 03/18/18  Yes Khatri, Hina, PA-C  ondansetron (ZOFRAN) 4 MG tablet TAKE (1) TABLET BY MOUTH EVERY (6) HOURS AS NEEDED FOR NAUSEA. 04/13/17  Yes Florian Buff, MD    Family History Family History  Problem Relation Age of Onset  . Hypertension Mother   . Diabetes Mother   . Cancer Mother        lung  . CAD Father   . Heart failure Father   . Heart disease Father   . Seizures Son   . Diabetes  Other   . Heart failure Other   . Hypertension Other   . Cancer Other     Social History Social History   Tobacco Use  . Smoking status: Current Every Day Smoker    Packs/day: 0.50    Years: 10.00    Pack years: 5.00    Types: Cigarettes  . Smokeless tobacco: Never Used  . Tobacco comment: 5-6 cigarettes a day  Substance Use Topics  . Alcohol use: No    Alcohol/week: 0.0 oz  . Drug use: No     Allergies   Aspirin; Nsaids; Latex; Morphine and related; Phenergan [promethazine]; Penicillins; and Toradol [ketorolac tromethamine]   Review of Systems Review of Systems  Gastrointestinal: Positive for abdominal pain, diarrhea, nausea and vomiting.  All other systems reviewed and are negative.    Physical Exam Updated Vital Signs BP (!) 151/90 (BP Location: Left Arm)   Pulse 72   Temp 98.3 F (36.8 C) (Oral)   Resp 18   Ht 5\' 6"  (1.676 m)   Wt 81.6 kg (180 lb)   LMP 03/16/2018   SpO2 100%   BMI 29.05 kg/m   Physical Exam  Constitutional: She is oriented to person, place, and time. She appears well-developed and well-nourished.  HENT:  Head: Normocephalic and atraumatic.  Mouth/Throat: Oropharynx is clear and moist.  Eyes: Pupils are equal, round, and reactive to light. Conjunctivae and EOM are normal.  Neck: Normal range of motion.  Cardiovascular: Normal rate, regular rhythm and normal heart sounds.  Pulmonary/Chest: Effort normal and breath sounds normal.  Abdominal: Soft. Bowel sounds are normal. She exhibits no distension and no fluid wave. There is no tenderness. There is no rigidity and no guarding.  Musculoskeletal: Normal range of motion.  Neurological: She is alert and oriented to person, place, and time.  Skin: Skin is warm and dry.  Psychiatric: She has a normal mood and affect.  Nursing note and vitals reviewed.    ED Treatments / Results  Labs (all labs ordered are listed, but only abnormal results are displayed) Labs Reviewed  COMPREHENSIVE  METABOLIC PANEL - Abnormal; Notable for the following components:      Result Value   Potassium 3.2 (*)    Glucose, Bld 138 (*)    Calcium 8.7 (*)    Total Protein 5.9 (*)    Albumin 3.1 (*)    All other components within normal limits  CBC - Abnormal; Notable for the following components:   WBC 10.6 (*)  All other components within normal limits  URINALYSIS, ROUTINE W REFLEX MICROSCOPIC - Abnormal; Notable for the following components:   Hgb urine dipstick SMALL (*)    Ketones, ur 80 (*)    Protein, ur 30 (*)    All other components within normal limits  C DIFFICILE QUICK SCREEN W PCR REFLEX  GASTROINTESTINAL PANEL BY PCR, STOOL (REPLACES STOOL CULTURE)  LIPASE, BLOOD  I-STAT BETA HCG BLOOD, ED ([facility], [facility], [facility] ONLY)    EKG None  Radiology No results found.  Procedures Procedures (including critical care time)  Medications Ordered in ED Medications  sodium chloride 0.9 % bolus 1,000 mL (0 mLs Intravenous Stopped 03/20/18 0250)    Followed by  sodium chloride 0.9 % bolus 1,000 mL (0 mLs Intravenous Stopped 03/20/18 0247)  dicyclomine (BENTYL) injection 20 mg (20 mg Intramuscular Given 03/20/18 0210)  ondansetron (ZOFRAN) injection 4 mg (4 mg Intravenous Given 03/20/18 0210)  gi cocktail (Maalox,Lidocaine,Donnatal) (30 mLs Oral Given 03/20/18 0231)  fentaNYL (SUBLIMAZE) injection 25 mcg (25 mcg Intravenous Given 03/20/18 0306)     Initial Impression / Assessment and Plan / ED Course  I have reviewed the triage vital signs and the nursing notes.  Pertinent labs & imaging results that were available during my care of the patient were reviewed by me and considered in my medical decision making (see chart for details).  47 y.o. F here with abdominal pain.  This is her 3rd visit in 5 days for same.  Diagnosed with enteritis on CT on 03/16/18, has not been tolerating oral medications or fluids well at home.  Here she is afebrile and nontoxic.  Her abdomen is soft and benign.  She  does report some continued cramping and what sounds like "spasms".  Screening labs are overall reassuring.  I do not feel she needs repeat CT at this time is etiology of her pain has already been identified.  She does have ketones in the urine consistent with some dehydration.  We will give IV fluids and aim for symptomatic control.  Patient feeling better after medications here.  She has tolerated full cup of ginger-ale.  No active emesis or diarrhea here in the ED. states she feels okay to go home.  She did express some continued difficulty taking her medications as the pills are very large.  We will switch her antibiotics to liquid.  Have also given her small supply of liquid pain medicine.  Can continue the ODT Zofran.  Does not currently have PCP, given information for some local clinics and 800-number that she can call to try to help establish care as she does have medicare and medicaid.  Discussed plan with patient, she acknowledged understanding and agreed with plan of care.  Return precautions given for new or worsening symptoms.  Final Clinical Impressions(s) / ED Diagnoses   Final diagnoses:  Nausea vomiting and diarrhea    ED Discharge Orders        Ordered    ciprofloxacin (CIPRO) 500 MG/5ML (10%) suspension  2 times daily     03/20/18 0419    metroNIDAZOLE (FLAGYL) 50 mg/ml oral suspension  2 times daily     03/20/18 0419    ondansetron (ZOFRAN ODT) 4 MG disintegrating tablet  Every 8 hours PRN     03/20/18 0419    HYDROcodone-acetaminophen (HYCET) 7.5-325 mg/15 ml solution  Every 8 hours PRN     03/20/18 0419       Larene Pickett, PA-C 03/20/18 [phone]  Mesner, Corene Cornea, MD 03/20/18 [phone]

## 2018-03-20 NOTE — Discharge Instructions (Signed)
We have switched all your medications to liquid, try to take as directed.  Continue to push oral fluids. Follow-up with primary care doctor--- you can call the 800 number on paperwork to try to establish care. Return to the ED for new or worsening symptoms.

## 2018-03-20 NOTE — ED Notes (Addendum)
Wasted Fentanyl 75mg  WIS. Witnessed by Otila Kluver, Therapist, sports.

## 2018-05-07 ENCOUNTER — Encounter (HOSPITAL_COMMUNITY): Payer: Self-pay | Admitting: Emergency Medicine

## 2018-05-07 ENCOUNTER — Emergency Department (HOSPITAL_COMMUNITY)
Admission: EM | Admit: 2018-05-07 | Discharge: 2018-05-07 | Disposition: A | Discharge status: Home / Self Care | Payer: Medicare Other | Source: Home / Self Care | Attending: Emergency Medicine | Admitting: Emergency Medicine

## 2018-05-07 ENCOUNTER — Other Ambulatory Visit: Payer: Self-pay

## 2018-05-07 DIAGNOSIS — R1084 Generalized abdominal pain: Secondary | ICD-10-CM | POA: Insufficient documentation

## 2018-05-07 DIAGNOSIS — Z79899 Other long term (current) drug therapy: Secondary | ICD-10-CM

## 2018-05-07 DIAGNOSIS — Z91040 Latex allergy status: Secondary | ICD-10-CM

## 2018-05-07 DIAGNOSIS — K8000 Calculus of gallbladder with acute cholecystitis without obstruction: Secondary | ICD-10-CM | POA: Diagnosis not present

## 2018-05-07 DIAGNOSIS — R52 Pain, unspecified: Secondary | ICD-10-CM | POA: Diagnosis not present

## 2018-05-07 DIAGNOSIS — R112 Nausea with vomiting, unspecified: Secondary | ICD-10-CM

## 2018-05-07 DIAGNOSIS — R197 Diarrhea, unspecified: Secondary | ICD-10-CM

## 2018-05-07 DIAGNOSIS — F17210 Nicotine dependence, cigarettes, uncomplicated: Secondary | ICD-10-CM | POA: Insufficient documentation

## 2018-05-07 LAB — CBC WITH DIFFERENTIAL/PLATELET
Basophils Absolute: 0 10*3/uL (ref 0.0–0.1)
Basophils Relative: 0 %
Eosinophils Absolute: 0.2 10*3/uL (ref 0.0–0.7)
Eosinophils Relative: 1 %
HCT: 43.7 % (ref 36.0–46.0)
Hemoglobin: 14.6 g/dL (ref 12.0–15.0)
Lymphocytes Relative: 13 %
Lymphs Abs: 1.4 10*3/uL (ref 0.7–4.0)
MCH: 30.2 pg (ref 26.0–34.0)
MCHC: 33.4 g/dL (ref 30.0–36.0)
MCV: 90.3 fL (ref 78.0–100.0)
Monocytes Absolute: 0.4 10*3/uL (ref 0.1–1.0)
Monocytes Relative: 4 %
Neutro Abs: 9.3 10*3/uL — ABNORMAL HIGH (ref 1.7–7.7)
Neutrophils Relative %: 82 %
Platelets: 186 10*3/uL (ref 150–400)
RBC: 4.84 MIL/uL (ref 3.87–5.11)
RDW: 12.4 % (ref 11.5–15.5)
WBC: 11.4 10*3/uL — ABNORMAL HIGH (ref 4.0–10.5)

## 2018-05-07 LAB — COMPREHENSIVE METABOLIC PANEL
ALT: 11 U/L (ref 0–44)
AST: 12 U/L — ABNORMAL LOW (ref 15–41)
Albumin: 3.4 g/dL — ABNORMAL LOW (ref 3.5–5.0)
Alkaline Phosphatase: 66 U/L (ref 38–126)
Anion gap: 5 (ref 5–15)
BUN: 10 mg/dL (ref 6–20)
CO2: 27 mmol/L (ref 22–32)
Calcium: 8.4 mg/dL — ABNORMAL LOW (ref 8.9–10.3)
Chloride: 107 mmol/L (ref 98–111)
Creatinine, Ser: 0.81 mg/dL (ref 0.44–1.00)
GFR calc Af Amer: >60 mL/min (ref >60)
GFR calc non Af Amer: >60 mL/min (ref >60)
Glucose, Bld: 128 mg/dL — ABNORMAL HIGH (ref 70–99)
Potassium: 4.1 mmol/L (ref 3.5–5.1)
Sodium: 139 mmol/L (ref 135–145)
Total Bilirubin: 1 mg/dL (ref 0.3–1.2)
Total Protein: 6.3 g/dL — ABNORMAL LOW (ref 6.5–8.1)

## 2018-05-07 LAB — URINALYSIS, ROUTINE W REFLEX MICROSCOPIC
Bilirubin Urine: NEGATIVE
Glucose, UA: NEGATIVE mg/dL
Hgb urine dipstick: NEGATIVE
Ketones, ur: NEGATIVE mg/dL
Leukocytes, UA: NEGATIVE
Nitrite: NEGATIVE
Protein, ur: NEGATIVE mg/dL
Specific Gravity, Urine: 1.017 (ref 1.005–1.030)
pH: 6 (ref 5.0–8.0)

## 2018-05-07 LAB — I-STAT BETA HCG BLOOD, ED (MC, WL, AP ONLY): I-stat hCG, quantitative: <5 m[IU]/mL (ref <5)

## 2018-05-07 LAB — LIPASE, BLOOD: Lipase: 33 U/L (ref 11–51)

## 2018-05-07 MED ORDER — DICYCLOMINE HCL 20 MG PO TABS: 20.0000 mg | ORAL_TABLET | Freq: Four times a day (QID) | ORAL | 0 refills | Status: DC | PRN

## 2018-05-07 MED ORDER — MORPHINE SULFATE (PF) 4 MG/ML IV SOLN
4.0000 mg | Freq: Once | INTRAVENOUS | Status: DC
  Filled 2018-05-07: qty 1

## 2018-05-07 MED ORDER — LOPERAMIDE HCL 2 MG PO CAPS
4.0000 mg | ORAL_CAPSULE | Freq: Once | ORAL | Status: AC
  Administered 2018-05-07: 4 mg via ORAL
  Filled 2018-05-07: qty 2

## 2018-05-07 MED ORDER — HYDROMORPHONE HCL 1 MG/ML IJ SOLN
0.7500 mg | Freq: Once | INTRAMUSCULAR | Status: AC
  Administered 2018-05-07: 0.75 mg via INTRAVENOUS
  Filled 2018-05-07: qty 1

## 2018-05-07 MED ORDER — DICYCLOMINE HCL 10 MG PO CAPS
10.0000 mg | ORAL_CAPSULE | Freq: Once | ORAL | Status: AC
  Administered 2018-05-07: 10 mg via ORAL
  Filled 2018-05-07: qty 1

## 2018-05-07 MED ORDER — ONDANSETRON HCL 4 MG PO TABS: 4.0000 mg | ORAL_TABLET | Freq: Four times a day (QID) | ORAL | 0 refills | Status: DC

## 2018-05-07 MED ORDER — LACTATED RINGERS IV BOLUS
1000.0000 mL | Freq: Once | INTRAVENOUS | Status: AC
  Administered 2018-05-07: 1000 mL via INTRAVENOUS

## 2018-05-07 MED ORDER — PROCHLORPERAZINE EDISYLATE 10 MG/2ML IJ SOLN
5.0000 mg | Freq: Once | INTRAMUSCULAR | Status: AC
  Administered 2018-05-07: 5 mg via INTRAVENOUS
  Filled 2018-05-07: qty 2

## 2018-05-07 MED ORDER — ONDANSETRON HCL 4 MG/2ML IJ SOLN
4.0000 mg | Freq: Once | INTRAMUSCULAR | Status: AC
  Administered 2018-05-07: 4 mg via INTRAVENOUS
  Filled 2018-05-07: qty 2

## 2018-05-07 NOTE — ED Triage Notes (Signed)
Onset 3 days, lower abdominal pain, vomiting and diarrhea.

## 2018-05-07 NOTE — ED Provider Notes (Signed)
[facility] EMERGENCY DEPARTMENT Provider Note   CSN: [id number] Arrival date & time: 05/07/18  0109     History   Chief Complaint Chief Complaint  Patient presents with  . Abdominal Pain    HPI [name] is a 47 y.o. female.  HPI  71yF with abdominal pain. Onset 2d ago. Sharp stabbing pain in lower abdomen. Feels like she is "riding a contraction out." Associated with n/v/d. Subjective fever. Feels like pain is across incision from previous c-sections. Is sexually active. Denies urinary complaints. No unusual vaginal bleeding or discharge.   Past Medical History:  Diagnosis Date  . Arthritis    hands, knees  . Endometrial polyp   . Fibromyalgia   . Headache(784.0)   . Pregnancy   . SVD (spontaneous vaginal delivery)    x 1    Patient Active Problem List   Diagnosis Date Noted  . Menorrhagia with regular cycle 03/19/2017  . Adjustment disorder with mixed anxiety and depressed mood 02/21/2015  . Dysmenorrhea 01/08/2015  . Dyspareunia 01/08/2015  . Uterus retroversion 01/08/2015  . Sterilization 06/25/2012    Past Surgical History:  Procedure Laterality Date  . CESAREAN SECTION     x 4  . right thumb surgery     pins  . WISDOM TOOTH EXTRACTION       OB History    Gravida  7   Para  6   Term  6   Preterm      AB  1   Living  6     SAB  1   TAB      Ectopic      Multiple      Live Births  1            Home Medications    Prior to Admission medications   Medication Sig Start Date End Date Taking? Authorizing Provider  ciprofloxacin (CIPRO) 500 MG/5ML (10%) suspension Take 5 mLs (500 mg total) by mouth 2 (two) times daily. 03/20/18   Larene Pickett, PA-C  fluticasone (FLONASE) 50 MCG/ACT nasal spray Place 1 spray into both nostrils daily as needed for allergies or rhinitis.    [provider]  HYDROcodone-acetaminophen (HYCET) 7.5-325 mg/15 ml solution Take 15 mLs by mouth every 8 (eight) hours as needed for moderate  pain. 03/20/18   Larene Pickett, PA-C  metoCLOPramide (REGLAN) 10 MG tablet Take 1 tablet (10 mg total) by mouth every 8 (eight) hours as needed for nausea or vomiting. 03/16/18   Francine Graven, DO  metroNIDAZOLE (FLAGYL) 50 mg/ml oral suspension Take 10 mLs (500 mg total) by mouth 2 (two) times daily. 03/20/18   Larene Pickett, PA-C  omeprazole (PRILOSEC) 40 MG capsule TAKE ONE CAPSULE BY MOUTH DAILY. Patient taking differently: TAKE ONE CAPSULE BY MOUTH DAILY AS NEEDED FOR ACID REFLUX 12/14/17   Jonnie Kind, MD  ondansetron (ZOFRAN ODT) 4 MG disintegrating tablet Take 1 tablet (4 mg total) by mouth every 8 (eight) hours as needed for nausea. 03/20/18   Larene Pickett, PA-C  ondansetron (ZOFRAN) 4 MG tablet TAKE (1) TABLET BY MOUTH EVERY (6) HOURS AS NEEDED FOR NAUSEA. 04/13/17   Florian Buff, MD    Family History Family History  Problem Relation Age of Onset  . Hypertension Mother   . Diabetes Mother   . Cancer Mother        lung  . CAD Father   . Heart failure Father   .  Heart disease Father   . Seizures Son   . Diabetes Other   . Heart failure Other   . Hypertension Other   . Cancer Other     Social History Social History   Tobacco Use  . Smoking status: Current Every Day Smoker    Packs/day: 0.50    Years: 10.00    Pack years: 5.00    Types: Cigarettes  . Smokeless tobacco: Never Used  . Tobacco comment: 5-6 cigarettes a day  Substance Use Topics  . Alcohol use: No    Alcohol/week: 0.0 oz  . Drug use: No     Allergies   Aspirin; Nsaids; Latex; Morphine and related; Phenergan [promethazine]; Penicillins; and Toradol [ketorolac tromethamine]   Review of Systems Review of Systems  All systems reviewed and negative, other than as noted in HPI.  Physical Exam Updated Vital Signs BP 130/76 (BP Location: Left Arm)   Pulse 64   Temp 98.2 F (36.8 C) (Oral)   Resp 18   Wt 83.9 kg (185 lb)   LMP 04/19/2018   SpO2 99%   BMI 29.86 kg/m   Physical Exam   Constitutional: She appears well-developed and well-nourished. No distress.  HENT:  Head: Normocephalic and atraumatic.  Eyes: Conjunctivae are normal. Right eye exhibits no discharge. Left eye exhibits no discharge.  Neck: Neck supple.  Cardiovascular: Normal rate, regular rhythm and normal heart sounds. Exam reveals no gallop and no friction rub.  No murmur heard. Pulmonary/Chest: Effort normal and breath sounds normal. No respiratory distress.  Abdominal: Soft. She exhibits no distension. There is tenderness.  Mild diffuse tenderness without rebound or guarding.  No distention.  Musculoskeletal: She exhibits no edema or tenderness.  Neurological: She is alert.  Skin: Skin is warm and dry.  Psychiatric: She has a normal mood and affect. Her behavior is normal. Thought content normal.  Nursing note and vitals reviewed.    ED Treatments / Results  Labs (all labs ordered are listed, but only abnormal results are displayed) Labs Reviewed  CBC WITH DIFFERENTIAL/PLATELET - Abnormal; Notable for the following components:      Result Value   WBC 11.4 (*)    Neutro Abs 9.3 (*)    All other components within normal limits  COMPREHENSIVE METABOLIC PANEL - Abnormal; Notable for the following components:   Glucose, Bld 128 (*)    Calcium 8.4 (*)    Total Protein 6.3 (*)    Albumin 3.4 (*)    AST 12 (*)    All other components within normal limits  URINALYSIS, ROUTINE W REFLEX MICROSCOPIC  LIPASE, BLOOD  I-STAT BETA HCG BLOOD, ED ([facility], [facility], [facility] ONLY)    EKG None  Radiology  Procedures Procedures (including critical care time)  Medications Ordered in ED Medications - No data to display   Initial Impression / Assessment and Plan / ED Course  I have reviewed the triage vital signs and the nursing notes.  Pertinent labs & imaging results that were available during my care of the patient were reviewed by me and considered in my medical decision making (see chart for details).       47 year old female with abdominal pain and nausea, vomiting vomiting and diarrhea.  Pain is diffuse.  Tenderness is diffuse on exam but fairly mild.  Labs fairly unremarkable aside from mild leukocytosis.  This is nonspecific.  She is afebrile.  LFTs are normal.  UA looks okay.  She is treated symptomatically with significant improvement.  Suspect  that this may be viral GI illness.  Plan continue symptomatic treatment.  Imaging was deferred on this visit.  Of note she was seen last month for similar type symptoms and had imaging which showed enteritis.  She reports that her symptoms completely resolved prior to beginning again a few days ago.  She is [place] symptomatic, although much improvement from presentation.  She needs to leave to pick her child up.  I was planning on discharging her regardless.  Plan continue symptomatic treatment.  Return precautions were discussed.  Outpatient follow-up as needed otherwise.  Final Clinical Impressions(s) / ED Diagnoses   Final diagnoses:  Generalized abdominal pain  Nausea vomiting and diarrhea    ED Discharge Orders    None       Virgel Manifold, MD 05/09/18 [phone]

## 2018-05-08 ENCOUNTER — Emergency Department (HOSPITAL_COMMUNITY)
Admission: EM | Admit: 2018-05-08 | Discharge: 2018-05-08 | Disposition: A | Discharge status: Home / Self Care | Payer: Medicare Other | Source: Home / Self Care | Attending: Emergency Medicine | Admitting: Emergency Medicine

## 2018-05-08 ENCOUNTER — Other Ambulatory Visit: Payer: Self-pay

## 2018-05-08 ENCOUNTER — Emergency Department (HOSPITAL_COMMUNITY): Payer: Medicare Other

## 2018-05-08 ENCOUNTER — Encounter (HOSPITAL_COMMUNITY): Payer: Self-pay | Admitting: Emergency Medicine

## 2018-05-08 DIAGNOSIS — F17210 Nicotine dependence, cigarettes, uncomplicated: Secondary | ICD-10-CM

## 2018-05-08 DIAGNOSIS — R1010 Upper abdominal pain, unspecified: Secondary | ICD-10-CM

## 2018-05-08 DIAGNOSIS — Z79899 Other long term (current) drug therapy: Secondary | ICD-10-CM | POA: Insufficient documentation

## 2018-05-08 LAB — URINALYSIS, ROUTINE W REFLEX MICROSCOPIC
BILIRUBIN URINE: NEGATIVE
Glucose, UA: NEGATIVE mg/dL
Hgb urine dipstick: NEGATIVE
KETONES UR: 5 mg/dL — AB
Leukocytes, UA: NEGATIVE
Nitrite: NEGATIVE
Protein, ur: NEGATIVE mg/dL
Specific Gravity, Urine: 1.016 (ref 1.005–1.030)
pH: 6 (ref 5.0–8.0)

## 2018-05-08 LAB — COMPREHENSIVE METABOLIC PANEL
ALT: 10 U/L (ref 0–44)
AST: 11 U/L — ABNORMAL LOW (ref 15–41)
Albumin: 3.5 g/dL (ref 3.5–5.0)
Alkaline Phosphatase: 64 U/L (ref 38–126)
Anion gap: 6 (ref 5–15)
BUN: 7 mg/dL (ref 6–20)
CALCIUM: 8.8 mg/dL — AB (ref 8.9–10.3)
CO2: 27 mmol/L (ref 22–32)
Chloride: 105 mmol/L (ref 98–111)
Creatinine, Ser: 0.75 mg/dL (ref 0.44–1.00)
GFR calc non Af Amer: >60 mL/min (ref >60)
Glucose, Bld: 136 mg/dL — ABNORMAL HIGH (ref 70–99)
Potassium: 3.4 mmol/L — ABNORMAL LOW (ref 3.5–5.1)
Sodium: 138 mmol/L (ref 135–145)
TOTAL PROTEIN: 6.6 g/dL (ref 6.5–8.1)
Total Bilirubin: 1.1 mg/dL (ref 0.3–1.2)

## 2018-05-08 LAB — CBC WITH DIFFERENTIAL/PLATELET
Basophils Absolute: 0 10*3/uL (ref 0.0–0.1)
Basophils Relative: 0 %
EOS ABS: 0.2 10*3/uL (ref 0.0–0.7)
EOS PCT: 2 %
HCT: 42.2 % (ref 36.0–46.0)
HEMOGLOBIN: 14 g/dL (ref 12.0–15.0)
Lymphocytes Relative: 21 %
Lymphs Abs: 1.7 10*3/uL (ref 0.7–4.0)
MCH: 29.9 pg (ref 26.0–34.0)
MCHC: 33.2 g/dL (ref 30.0–36.0)
MCV: 90.2 fL (ref 78.0–100.0)
Monocytes Absolute: 0.4 10*3/uL (ref 0.1–1.0)
Monocytes Relative: 5 %
NEUTROS PCT: 72 %
Neutro Abs: 6 10*3/uL (ref 1.7–7.7)
Platelets: 178 10*3/uL (ref 150–400)
RBC: 4.68 MIL/uL (ref 3.87–5.11)
RDW: 12.4 % (ref 11.5–15.5)
WBC: 8.4 10*3/uL (ref 4.0–10.5)

## 2018-05-08 LAB — LIPASE, BLOOD: Lipase: 36 U/L (ref 11–51)

## 2018-05-08 MED ORDER — FAMOTIDINE IN NACL 20-0.9 MG/50ML-% IV SOLN
20.0000 mg | Freq: Once | INTRAVENOUS | Status: AC
Start: 2018-05-08 — End: 2018-05-08
  Administered 2018-05-08: 20 mg via INTRAVENOUS
  Filled 2018-05-08: qty 50

## 2018-05-08 MED ORDER — SODIUM CHLORIDE 0.9 % IV BOLUS
1000.0000 mL | Freq: Once | INTRAVENOUS | Status: AC
Start: 2018-05-08 — End: 2018-05-08
  Administered 2018-05-08: 1000 mL via INTRAVENOUS

## 2018-05-08 MED ORDER — HYDROMORPHONE HCL 1 MG/ML IJ SOLN
1.0000 mg | Freq: Once | INTRAMUSCULAR | Status: AC
  Administered 2018-05-08: 1 mg via INTRAVENOUS
  Filled 2018-05-08: qty 1

## 2018-05-08 MED ORDER — OXYCODONE-ACETAMINOPHEN 5-325 MG PO TABS: 2.0000 | ORAL_TABLET | Freq: Four times a day (QID) | ORAL | 0 refills | Status: DC | PRN

## 2018-05-08 MED ORDER — ONDANSETRON HCL 4 MG/2ML IJ SOLN
4.0000 mg | Freq: Once | INTRAMUSCULAR | Status: AC
  Administered 2018-05-08: 4 mg via INTRAVENOUS
  Filled 2018-05-08: qty 2

## 2018-05-08 MED ORDER — IOPAMIDOL (ISOVUE-300) INJECTION 61%
100.0000 mL | Freq: Once | INTRAVENOUS | Status: AC | PRN
  Administered 2018-05-08: 100 mL via INTRAVENOUS

## 2018-05-08 NOTE — ED Provider Notes (Signed)
[facility] EMERGENCY DEPARTMENT Provider Note   CSN: [id number] Arrival date & time: 05/08/18  1537     History   Chief Complaint Chief Complaint  Patient presents with  . Abdominal Pain    HPI Marget Outten Toth is a 47 y.o. female.  She started with spasmodic abdominal pain 2 days ago.  It is associated with nausea and vomiting.  She had diarrhea yesterday.  She feels hot and cold but did not check her fever.  No urinary symptoms.  She was seen here yesterday and had normal sets of labs and urinalysis and pregnancy test.  States she was improved when she left but then the symptoms all recurred except for the diarrhea.  She states she is getting burning sensation in her throat which causes her to vomit and then gives her upper abdominal spasm severe pain.  She denies prior history of this. The history is provided by the patient.  Abdominal Pain   This is a new problem. The current episode started 2 days ago. The problem occurs constantly. The problem has not changed since onset.The pain is associated with an unknown factor. The pain is located in the generalized abdominal region. The quality of the pain is cramping. The pain is severe. Associated symptoms include anorexia, fever, diarrhea, nausea and vomiting. Pertinent negatives include hematochezia, melena, constipation, dysuria, frequency, hematuria, headaches, arthralgias and myalgias. The symptoms are aggravated by eating. Nothing relieves the symptoms.  She has a prior history C-sections but denies other abdominal surgeries.  Past Medical History:  Diagnosis Date  . Arthritis    hands, knees  . Endometrial polyp   . Fibromyalgia   . Headache(784.0)   . Pregnancy   . SVD (spontaneous vaginal delivery)    x 1    Patient Active Problem List   Diagnosis Date Noted  . Menorrhagia with regular cycle 03/19/2017  . Adjustment disorder with mixed anxiety and depressed mood 02/21/2015  . Dysmenorrhea 01/08/2015  . Dyspareunia  01/08/2015  . Uterus retroversion 01/08/2015  . Sterilization 06/25/2012    Past Surgical History:  Procedure Laterality Date  . CESAREAN SECTION     x 4  . right thumb surgery     pins  . WISDOM TOOTH EXTRACTION       OB History    Gravida  7   Para  6   Term  6   Preterm      AB  1   Living  6     SAB  1   TAB      Ectopic      Multiple      Live Births  1            Home Medications    Prior to Admission medications   Medication Sig Start Date End Date Taking? Authorizing Provider  ciprofloxacin (CIPRO) 500 MG/5ML (10%) suspension Take 5 mLs (500 mg total) by mouth 2 (two) times daily. Patient not taking: Reported on 05/07/2018 03/20/18   Larene Pickett, PA-C  dicyclomine (BENTYL) 20 MG tablet Take 1 tablet (20 mg total) by mouth every 6 (six) hours as needed for spasms. 05/07/18   Virgel Manifold, MD  fluticasone (FLONASE) 50 MCG/ACT nasal spray Place 1 spray into both nostrils daily as needed for allergies or rhinitis.    [provider]  HYDROcodone-acetaminophen (HYCET) 7.5-325 mg/15 ml solution Take 15 mLs by mouth every 8 (eight) hours as needed for moderate pain. 03/20/18   Quincy Carnes  M, PA-C  metoCLOPramide (REGLAN) 10 MG tablet Take 1 tablet (10 mg total) by mouth every 8 (eight) hours as needed for nausea or vomiting. 03/16/18   Francine Graven, DO  metroNIDAZOLE (FLAGYL) 50 mg/ml oral suspension Take 10 mLs (500 mg total) by mouth 2 (two) times daily. Patient not taking: Reported on 05/07/2018 03/20/18   Larene Pickett, PA-C  omeprazole (PRILOSEC) 40 MG capsule TAKE ONE CAPSULE BY MOUTH DAILY. Patient taking differently: TAKE ONE CAPSULE BY MOUTH DAILY AS NEEDED FOR ACID REFLUX 12/14/17   Jonnie Kind, MD  ondansetron (ZOFRAN ODT) 4 MG disintegrating tablet Take 1 tablet (4 mg total) by mouth every 8 (eight) hours as needed for nausea. 03/20/18   Larene Pickett, PA-C  ondansetron (ZOFRAN) 4 MG tablet TAKE (1) TABLET BY MOUTH EVERY (6)  HOURS AS NEEDED FOR NAUSEA. 04/13/17   Florian Buff, MD  ondansetron (ZOFRAN) 4 MG tablet Take 1 tablet (4 mg total) by mouth every 6 (six) hours. 05/07/18   Virgel Manifold, MD  SUBOXONE 8-2 MG FILM Place 2 Film under the tongue daily. 05/01/18   [provider]    Family History Family History  Problem Relation Age of Onset  . Hypertension Mother   . Diabetes Mother   . Cancer Mother        lung  . CAD Father   . Heart failure Father   . Heart disease Father   . Seizures Son   . Diabetes Other   . Heart failure Other   . Hypertension Other   . Cancer Other     Social History Social History   Tobacco Use  . Smoking status: Current Every Day Smoker    Packs/day: 0.50    Years: 10.00    Pack years: 5.00    Types: Cigarettes  . Smokeless tobacco: Never Used  . Tobacco comment: 5-6 cigarettes a day  Substance Use Topics  . Alcohol use: No    Alcohol/week: 0.0 oz  . Drug use: No     Allergies   Aspirin; Nsaids; Latex; Morphine and related; Phenergan [promethazine]; Penicillins; and Toradol [ketorolac tromethamine]   Review of Systems Review of Systems  Constitutional: Positive for appetite change, chills and fever.  HENT: Negative for sore throat.   Eyes: Negative for visual disturbance.  Respiratory: Negative for shortness of breath.   Cardiovascular: Negative for chest pain.  Gastrointestinal: Positive for abdominal pain, anorexia, diarrhea, nausea and vomiting. Negative for constipation, hematochezia and melena.  Genitourinary: Negative for dysuria, frequency and hematuria.  Musculoskeletal: Negative for arthralgias and myalgias.  Skin: Negative for rash.  Neurological: Negative for headaches.     Physical Exam Updated Vital Signs BP (!) 152/95 (BP Location: Right Arm)   Pulse 82   Temp 98.1 F (36.7 C) (Oral)   Resp 17   Ht 5\' 6"  (1.676 m)   Wt 83.9 kg (185 lb)   LMP 04/19/2018   SpO2 98%   BMI 29.86 kg/m   Physical Exam  Constitutional:  She appears well-developed and well-nourished. No distress.  HENT:  Head: Normocephalic and atraumatic.  Mouth/Throat: Oropharynx is clear and moist.  Eyes: Pupils are equal, round, and reactive to light. Conjunctivae and EOM are normal.  Neck: Neck supple.  Cardiovascular: Normal rate, regular rhythm, normal heart sounds and intact distal pulses.  No murmur heard. Pulmonary/Chest: Effort normal and breath sounds normal. No respiratory distress.  Abdominal: Soft. Normal appearance and bowel sounds are normal. There is generalized  tenderness. There is no rigidity, no rebound and no guarding.  Musculoskeletal: She exhibits no edema or tenderness.  Neurological: She is alert.  Skin: Skin is warm and dry. Capillary refill takes less than 2 seconds.  Psychiatric: She has a normal mood and affect.  Nursing note and vitals reviewed.    ED Treatments / Results  Labs (all labs ordered are listed, but only abnormal results are displayed) Labs Reviewed  COMPREHENSIVE METABOLIC PANEL - Abnormal; Notable for the following components:      Result Value   Potassium 3.4 (*)    Glucose, Bld 136 (*)    Calcium 8.8 (*)    AST 11 (*)    All other components within normal limits  URINALYSIS, ROUTINE W REFLEX MICROSCOPIC - Abnormal; Notable for the following components:   Ketones, ur 5 (*)    All other components within normal limits  CBC WITH DIFFERENTIAL/PLATELET  LIPASE, BLOOD    EKG None  Radiology Ct Abdomen Pelvis W Contrast  Result Date: 05/08/2018 CLINICAL DATA:  Per ED notes: Patient c/o upper abd pain/spasms x2 days. Patient states nausea, vomiting, diarrhea, and intermittent fevers. Denies any urinary symptoms EXAM: CT ABDOMEN AND PELVIS WITH CONTRAST TECHNIQUE: Multidetector CT imaging of the abdomen and pelvis was performed using the standard protocol following bolus administration of intravenous contrast. CONTRAST:  159mL ISOVUE-300 IOPAMIDOL (ISOVUE-300) INJECTION 61% COMPARISON:   CT of the abdomen and pelvis on 03/16/2018 FINDINGS: Lower chest: No pulmonary nodules, pleural effusions, or infiltrates. Heart size is normal. Hepatobiliary: No pericardial effusion or significant coronary artery calcifications. The there is mild intrahepatic biliary duct dilatation. Common bile duct is 10 millimeters. Gallbladder appears contracted. Gallbladder wall is thickened or there is small amount of pericholecystic fluid. Pancreas: There is increased, mild dilatation of the pancreatic duct. Spleen: Normal in size without focal abnormality. Adrenals/Urinary Tract: Adrenal glands are normal in appearance. The RIGHT kidney is diminutive and there are linear areas of scarring throughout the kidney. No hydronephrosis. The LEFT kidney is normal in appearance. No suspicious renal mass. The urinary bladder is decompressed. Stomach/Bowel: The stomach is normal in appearance. There is bowel wall thickening and inflammation involving the majority of the small bowel, beginning with the jejunum and extending to the terminal ileum. The findings are diffuse and symmetric. There are small collections of mesenteric fluid. The colon is unremarkable. There is a small amount of fluid surrounding the appendix but this appears to be a secondary process. The appendix is normal in diameter. Vascular/Lymphatic: No significant vascular findings are present. No enlarged abdominal or pelvic lymph nodes. Reproductive: Uterus is present.  No adnexal mass. Other: There is a small amount of free pelvic fluid, increased since prior study. Musculoskeletal: No acute or significant osseous findings. IMPRESSION: 1. Significant small bowel abnormality without change in distribution. Findings are consistent with enteritis of the mid and distal small bowel. No evidence for perforation or abscess. 2. Increased ascites. 3. Increased intrahepatic and pancreatic duct dilatation. 4. Question of gallbladder wall thickening versus pericholecystic  fluid. Consider further evaluation with RIGHT UPPER QUADRANT ultrasound. Electronically Signed   By: Nolon Nations M.D.   On: 05/08/2018 18:01   US Abdomen Limited Ruq  Result Date: 05/09/2018 CLINICAL DATA:  Right upper quadrant abdominal pain for 1 week with nausea, vomiting and diarrhea. EXAM: ULTRASOUND ABDOMEN LIMITED RIGHT UPPER QUADRANT COMPARISON:  CT 05/08/2018. FINDINGS: Gallbladder: Multiple gallstones are again noted. The gallbladder is better distended and demonstrates no wall thickening. However, the patient  is reported to have a sonographic Murphy's sign by the sonographer. Common bile duct: Diameter: 7 mm.  Less dilated than yesterday's CT. Liver: No focal lesion identified. Within normal limits in parenchymal echogenicity. Portal vein is patent on color Doppler imaging with normal direction of blood flow towards the liver. IMPRESSION: 1. Cholelithiasis without objective signs of cholecystitis. The patient is reported to have a sonographic Murphy sign, however. 2. Mild biliary dilatation, improved from recent CT. Electronically Signed   By: Richardean Sale M.D.   On: 05/09/2018 10:17    Procedures Procedures (including critical care time)  Medications Ordered in ED Medications  HYDROmorphone (DILAUDID) injection 1 mg (has no administration in time range)  ondansetron (ZOFRAN) injection 4 mg (has no administration in time range)  sodium chloride 0.9 % bolus 1,000 mL (has no administration in time range)  famotidine (PEPCID) IVPB 20 mg premix (has no administration in time range)     Initial Impression / Assessment and Plan / ED Course  I have reviewed the triage vital signs and the nursing notes.  Pertinent labs & imaging results that were available during my care of the patient were reviewed by me and considered in my medical decision making (see chart for details).  Clinical Course as of May 09 1056  Sat May 08, 2018  1608 Patient here with severe crampy upper abdominal  pain and reflux symptoms.  She had benign set of labs here yesterday.  We will repeat her labs and order an abdominal CT along with fluids and symptomatic medication.   [MB]  1655 Does not feel significantly better.  Her lab work is fairly unremarkable but her CT did show some small bowel thickening like a possible enteritis along with a contracted gallbladder may be some wall thickening or pericholecystic fluid.  A right upper quadrant ultrasound was recommended.  They are not here today to do not critical studies.   [MB]  1822 I offered the patient admission for continued management of this but she states she is unable to due to childcare issues.  She was agreeable to go home with some nausea and pain medicine and we can set her up for an ultrasound tomorrow.  At that point she can see how she is doing and whether she may consider admission.   [MB]    Clinical Course User Index [MB] Hayden Rasmussen, MD     Final Clinical Impressions(s) / ED Diagnoses   Final diagnoses:  Pain of upper abdomen    ED Discharge Orders    None       Hayden Rasmussen, MD 05/09/18 1057

## 2018-05-08 NOTE — Discharge Instructions (Addendum)
You were evaluated in the emergency department for upper abdominal pain and nausea.  Your lab work was unremarkable but your CT showed some inflammatory changes in your small bowel and possibly your gallbladder.  You were offered admission but could not stay.  You should return tomorrow morning between 9 and 12 for an ultrasound and reevaluation.

## 2018-05-08 NOTE — ED Triage Notes (Signed)
Patient c/o upper abd pain/spasms x2 days. Patient states nausea, vomiting, diarrhea, and intermittent fevers. Denies any urinary symptoms.

## 2018-05-09 ENCOUNTER — Inpatient Hospital Stay (HOSPITAL_COMMUNITY): Admit: 2018-05-09 | Payer: Self-pay

## 2018-05-09 ENCOUNTER — Emergency Department (HOSPITAL_COMMUNITY): Payer: Medicare Other

## 2018-05-09 ENCOUNTER — Inpatient Hospital Stay (HOSPITAL_COMMUNITY)
Admission: EM | Admit: 2018-05-09 | Discharge: 2018-05-12 | DRG: 418 | Disposition: A | Discharge status: Home / Self Care | Payer: Medicare Other | Attending: Internal Medicine | Admitting: Internal Medicine

## 2018-05-09 ENCOUNTER — Encounter (HOSPITAL_COMMUNITY): Payer: Self-pay | Admitting: Emergency Medicine

## 2018-05-09 ENCOUNTER — Other Ambulatory Visit: Payer: Self-pay

## 2018-05-09 DIAGNOSIS — K810 Acute cholecystitis: Secondary | ICD-10-CM | POA: Diagnosis present

## 2018-05-09 DIAGNOSIS — Z886 Allergy status to analgesic agent status: Secondary | ICD-10-CM

## 2018-05-09 DIAGNOSIS — M62838 Other muscle spasm: Secondary | ICD-10-CM | POA: Diagnosis not present

## 2018-05-09 DIAGNOSIS — Z888 Allergy status to other drugs, medicaments and biological substances status: Secondary | ICD-10-CM

## 2018-05-09 DIAGNOSIS — K529 Noninfective gastroenteritis and colitis, unspecified: Secondary | ICD-10-CM

## 2018-05-09 DIAGNOSIS — Z79899 Other long term (current) drug therapy: Secondary | ICD-10-CM

## 2018-05-09 DIAGNOSIS — Z885 Allergy status to narcotic agent status: Secondary | ICD-10-CM

## 2018-05-09 DIAGNOSIS — Z91040 Latex allergy status: Secondary | ICD-10-CM

## 2018-05-09 DIAGNOSIS — R1011 Right upper quadrant pain: Secondary | ICD-10-CM

## 2018-05-09 DIAGNOSIS — R52 Pain, unspecified: Secondary | ICD-10-CM | POA: Diagnosis present

## 2018-05-09 DIAGNOSIS — K8000 Calculus of gallbladder with acute cholecystitis without obstruction: Principal | ICD-10-CM

## 2018-05-09 DIAGNOSIS — F17210 Nicotine dependence, cigarettes, uncomplicated: Secondary | ICD-10-CM | POA: Diagnosis present

## 2018-05-09 DIAGNOSIS — Z880 Allergy status to penicillin: Secondary | ICD-10-CM

## 2018-05-09 DIAGNOSIS — K8020 Calculus of gallbladder without cholecystitis without obstruction: Secondary | ICD-10-CM | POA: Diagnosis not present

## 2018-05-09 DIAGNOSIS — Z716 Tobacco abuse counseling: Secondary | ICD-10-CM

## 2018-05-09 DIAGNOSIS — M797 Fibromyalgia: Secondary | ICD-10-CM | POA: Diagnosis present

## 2018-05-09 DIAGNOSIS — G894 Chronic pain syndrome: Secondary | ICD-10-CM | POA: Diagnosis present

## 2018-05-09 DIAGNOSIS — Z01818 Encounter for other preprocedural examination: Secondary | ICD-10-CM

## 2018-05-09 DIAGNOSIS — K219 Gastro-esophageal reflux disease without esophagitis: Secondary | ICD-10-CM | POA: Diagnosis present

## 2018-05-09 DIAGNOSIS — E876 Hypokalemia: Secondary | ICD-10-CM | POA: Diagnosis present

## 2018-05-09 DIAGNOSIS — Z8249 Family history of ischemic heart disease and other diseases of the circulatory system: Secondary | ICD-10-CM | POA: Diagnosis not present

## 2018-05-09 DIAGNOSIS — F1120 Opioid dependence, uncomplicated: Secondary | ICD-10-CM | POA: Diagnosis present

## 2018-05-09 DIAGNOSIS — Z720 Tobacco use: Secondary | ICD-10-CM | POA: Diagnosis not present

## 2018-05-09 HISTORY — DX: Adjustment disorder, unspecified: F43.20

## 2018-05-09 LAB — COMPREHENSIVE METABOLIC PANEL
ALK PHOS: 61 U/L (ref 38–126)
ALT: 11 U/L (ref 0–44)
AST: 10 U/L — AB (ref 15–41)
Albumin: 3.6 g/dL (ref 3.5–5.0)
Anion gap: 6 (ref 5–15)
BILIRUBIN TOTAL: 0.9 mg/dL (ref 0.3–1.2)
BUN: 7 mg/dL (ref 6–20)
CALCIUM: 8.5 mg/dL — AB (ref 8.9–10.3)
CHLORIDE: 106 mmol/L (ref 98–111)
CO2: 25 mmol/L (ref 22–32)
CREATININE: 0.68 mg/dL (ref 0.44–1.00)
GFR calc Af Amer: >60 mL/min (ref >60)
Glucose, Bld: 124 mg/dL — ABNORMAL HIGH (ref 70–99)
Potassium: 3.4 mmol/L — ABNORMAL LOW (ref 3.5–5.1)
Sodium: 137 mmol/L (ref 135–145)
TOTAL PROTEIN: 6.6 g/dL (ref 6.5–8.1)

## 2018-05-09 LAB — CBC WITH DIFFERENTIAL/PLATELET
Basophils Absolute: 0 10*3/uL (ref 0.0–0.1)
Basophils Relative: 0 %
EOS ABS: 0.2 10*3/uL (ref 0.0–0.7)
EOS PCT: 2 %
HCT: 41.3 % (ref 36.0–46.0)
Hemoglobin: 13.9 g/dL (ref 12.0–15.0)
LYMPHS PCT: 11 %
Lymphs Abs: 1.4 10*3/uL (ref 0.7–4.0)
MCH: 30.3 pg (ref 26.0–34.0)
MCHC: 33.7 g/dL (ref 30.0–36.0)
MCV: 90 fL (ref 78.0–100.0)
MONO ABS: 0.4 10*3/uL (ref 0.1–1.0)
Monocytes Relative: 3 %
Neutro Abs: 10.8 10*3/uL — ABNORMAL HIGH (ref 1.7–7.7)
Neutrophils Relative %: 84 %
PLATELETS: 192 10*3/uL (ref 150–400)
RBC: 4.59 MIL/uL (ref 3.87–5.11)
RDW: 12.4 % (ref 11.5–15.5)
WBC: 12.9 10*3/uL — ABNORMAL HIGH (ref 4.0–10.5)

## 2018-05-09 LAB — RAPID URINE DRUG SCREEN, HOSP PERFORMED
Amphetamines: NOT DETECTED
BENZODIAZEPINES: NOT DETECTED
Barbiturates: NOT DETECTED
Cocaine: NOT DETECTED
OPIATES: POSITIVE — AB
Tetrahydrocannabinol: NOT DETECTED

## 2018-05-09 LAB — LIPASE, BLOOD: Lipase: 33 U/L (ref 11–51)

## 2018-05-09 MED ORDER — ONDANSETRON HCL 4 MG PO TABS
4.0000 mg | ORAL_TABLET | Freq: Four times a day (QID) | ORAL | Status: DC | PRN
  Administered 2018-05-11 – 2018-05-12 (×2): 4 mg via ORAL
  Filled 2018-05-09 (×2): qty 1

## 2018-05-09 MED ORDER — ACETAMINOPHEN 325 MG PO TABS
650.0000 mg | ORAL_TABLET | Freq: Four times a day (QID) | ORAL | Status: DC | PRN
  Administered 2018-05-10: 650 mg via ORAL
  Filled 2018-05-09: qty 2

## 2018-05-09 MED ORDER — POTASSIUM CHLORIDE IN NACL 20-0.9 MEQ/L-% IV SOLN
INTRAVENOUS | Status: DC
  Administered 2018-05-09 – 2018-05-12 (×3): via INTRAVENOUS

## 2018-05-09 MED ORDER — HYDROMORPHONE HCL 1 MG/ML IJ SOLN
0.5000 mg | INTRAMUSCULAR | Status: DC | PRN
  Administered 2018-05-09 – 2018-05-11 (×10): 0.5 mg via INTRAVENOUS
  Filled 2018-05-09 (×4): qty 0.5
  Filled 2018-05-09: qty 1
  Filled 2018-05-09 (×5): qty 0.5

## 2018-05-09 MED ORDER — SODIUM CHLORIDE 0.9 % IV BOLUS
1000.0000 mL | Freq: Once | INTRAVENOUS | Status: AC
  Administered 2018-05-09: 1000 mL via INTRAVENOUS

## 2018-05-09 MED ORDER — SODIUM CHLORIDE 0.9 % IV SOLN
1.0000 g | Freq: Three times a day (TID) | INTRAVENOUS | Status: DC
  Administered 2018-05-09 – 2018-05-11 (×7): 1 g via INTRAVENOUS
  Filled 2018-05-09 (×11): qty 1

## 2018-05-09 MED ORDER — ONDANSETRON HCL 4 MG/2ML IJ SOLN
4.0000 mg | Freq: Four times a day (QID) | INTRAMUSCULAR | Status: DC | PRN
  Administered 2018-05-09 – 2018-05-12 (×8): 4 mg via INTRAVENOUS
  Filled 2018-05-09 (×7): qty 2

## 2018-05-09 MED ORDER — HYDROMORPHONE HCL 1 MG/ML IJ SOLN
0.5000 mg | Freq: Once | INTRAMUSCULAR | Status: AC
  Administered 2018-05-09: 0.5 mg via INTRAVENOUS
  Filled 2018-05-09: qty 1

## 2018-05-09 MED ORDER — LORAZEPAM 2 MG/ML IJ SOLN
1.0000 mg | INTRAMUSCULAR | Status: DC | PRN
  Administered 2018-05-10: 1 mg via INTRAVENOUS
  Filled 2018-05-09: qty 1

## 2018-05-09 MED ORDER — ACETAMINOPHEN 650 MG RE SUPP: 650.0000 mg | Freq: Four times a day (QID) | RECTAL | Status: DC | PRN

## 2018-05-09 MED ORDER — HEPARIN SODIUM (PORCINE) 5000 UNIT/ML IJ SOLN
5000.0000 [IU] | Freq: Three times a day (TID) | INTRAMUSCULAR | Status: DC
  Administered 2018-05-09 – 2018-05-12 (×5): 5000 [IU] via SUBCUTANEOUS
  Filled 2018-05-09 (×6): qty 1

## 2018-05-09 MED ORDER — ONDANSETRON HCL 4 MG/2ML IJ SOLN
4.0000 mg | Freq: Once | INTRAMUSCULAR | Status: AC
  Administered 2018-05-09: 4 mg via INTRAVENOUS
  Filled 2018-05-09: qty 2

## 2018-05-09 NOTE — H&P (Signed)
History and Physical  [name] SJG:[id number] DOB: October 14, 1970 DOA: 05/09/2018   PCP: Patient, No Pcp Per   Patient coming from: Home  Chief Complaint: abdominal pain with n/v  HPI:  [name] is a 47 y.o. female with medical history of chronic pain syndrome, tobacco abuse, fibromyalgia presenting with 1 week history of generalized abdominal pain with associated nausea and vomiting.  The patient complained of low-grade temperature up to 100.0 F at home.  She denies any recent sick contacts.  The patient also developed loose stools, but this has resolved within the last 48 hours.  She denies any hematochezia, melena, hematemesis.  The patient denies any new medications.  There is no dysuria, hematuria, chest pain, shortness breath, coughing, hemoptysis.  The patient visited the emergency department on 05/07/2018.  She was treated symptomatically and released home.  She returned on 06/04/2018.  CT of the abdomen and pelvis at that time showed mild intrahepatic ductal dilatation with common bile duct of 10 mm.  There was also gallbladder wall thickening with a small amount of pericholecystic fluid.  There was also bowel wall thickening involving the jejunum to the terminal ileum.  The patient was offered admission, but because of childcare issues, the patient did not want to stay in the hospital.  She returned the morning of 05/09/2018 because of continued vomiting and abdominal pain.  WBC was noted to be 12.9.  BMP was essentially unremarkable with potassium 3.4.  Abdominal ultrasound was obtained and showed cholelithiasis with positive Murphy sign but without any gallbladder wall thickening.  Common bile duct was noted to be 7 mm.  Assessment/Plan: Symptomatic cholelithiasis/cholecystitis -Concerned about choledocholithiasis -MRCP -Start meropenem due to penicillin allergy -IV fluids -Pain control -Clear liquid diet, n.p.o. after midnight -General surgery consult  Chronic pain  syndrome -The patient gives a history that is inconsistent with what is documented in the [place] opioid database -she states she is no longer taking suboxone, but PMP Aware shows that suboxone was filled on 05/07/18, 04/26/18, 04/19/18, and 04/07/18 -nevertheless, judicious pain controlled will be required  SB wall thickening -enteritis vs reactive due to process in GB -diarrhea has resolved -monitor clinically  Hypokalemia -replete -check mag  Tobacco abuse -nicoderm patch deferred -I have discussed tobacco cessation with the patient.  I have counseled the patient regarding the negative impacts of continued tobacco use including but not limited to lung cancer, COPD, and cardiovascular disease.  I have discussed alternatives to tobacco and modalities that may help facilitate tobacco cessation including but not limited to biofeedback, hypnosis, and medications.  Total time spent with tobacco counseling was 4 minutes.          Past Medical History:  Diagnosis Date  . Arthritis    hands, knees  . Endometrial polyp   . Fibromyalgia   . Headache(784.0)   . Pregnancy   . SVD (spontaneous vaginal delivery)    x 1   Past Surgical History:  Procedure Laterality Date  . CESAREAN SECTION     x 4  . right thumb surgery     pins  . WISDOM TOOTH EXTRACTION     Social History:  reports that she has been smoking cigarettes.  She has a 5.00 pack-year smoking history. She has never used smokeless tobacco. She reports that she does not drink alcohol or use drugs.   Family History  Problem Relation Age of Onset  . Hypertension Mother   . Diabetes Mother   .  Cancer Mother        lung  . CAD Father   . Heart failure Father   . Heart disease Father   . Seizures Son   . Diabetes Other   . Heart failure Other   . Hypertension Other   . Cancer Other      Allergies  Allergen Reactions  . Aspirin Shortness Of Breath  . Nsaids Shortness Of Breath  . Latex Swelling  . Morphine And  Related Itching  . Phenergan [Promethazine] Other (See Comments)    Somnolence   . Penicillins Rash and Other (See Comments)    Pt states that she is allergic to all -cillins.  Has patient had a PCN reaction causing immediate rash, facial/tongue/throat swelling, SOB or lightheadedness with hypotension: No Has patient had a PCN reaction causing severe rash involving mucus membranes or skin necrosis: No Has patient had a PCN reaction that required hospitalization: No Has patient had a PCN reaction occurring within the last 10 years: No If all of the above answers are "NO", then may proceed with Cephalosporin use.  . Toradol [Ketorolac Tromethamine] Nausea And Vomiting     Prior to Admission medications   Medication Sig Start Date End Date Taking? Authorizing Provider  omeprazole (PRILOSEC) 40 MG capsule TAKE ONE CAPSULE BY MOUTH DAILY. 12/14/17  Yes Jonnie Kind, MD  calcium carbonate (TUMS - DOSED IN MG ELEMENTAL CALCIUM) 500 MG chewable tablet Chew 1 tablet by mouth 3 (three) times daily as needed for indigestion or heartburn.    [provider]  ciprofloxacin (CIPRO) 500 MG/5ML (10%) suspension Take 5 mLs (500 mg total) by mouth 2 (two) times daily. Patient not taking: Reported on 05/07/2018 03/20/18   Larene Pickett, PA-C  dicyclomine (BENTYL) 20 MG tablet Take 1 tablet (20 mg total) by mouth every 6 (six) hours as needed for spasms. Patient not taking: Reported on 05/09/2018 05/07/18   Virgel Manifold, MD  HYDROcodone-acetaminophen (HYCET) 7.5-325 mg/15 ml solution Take 15 mLs by mouth every 8 (eight) hours as needed for moderate pain. Patient not taking: Reported on 05/08/2018 03/20/18   Larene Pickett, PA-C  metoCLOPramide (REGLAN) 10 MG tablet Take 1 tablet (10 mg total) by mouth every 8 (eight) hours as needed for nausea or vomiting. Patient not taking: Reported on 05/08/2018 03/16/18   Francine Graven, DO  metroNIDAZOLE (FLAGYL) 50 mg/ml oral suspension Take 10 mLs (500 mg total)  by mouth 2 (two) times daily. Patient not taking: Reported on 05/07/2018 03/20/18   Larene Pickett, PA-C  ondansetron (ZOFRAN ODT) 4 MG disintegrating tablet Take 1 tablet (4 mg total) by mouth every 8 (eight) hours as needed for nausea. Patient not taking: Reported on 05/08/2018 03/20/18   Larene Pickett, PA-C  ondansetron (ZOFRAN) 4 MG tablet TAKE (1) TABLET BY MOUTH EVERY (6) HOURS AS NEEDED FOR NAUSEA. Patient not taking: Reported on 05/08/2018 04/13/17   Florian Buff, MD  ondansetron (ZOFRAN) 4 MG tablet Take 1 tablet (4 mg total) by mouth every 6 (six) hours. Patient not taking: Reported on 05/09/2018 05/07/18   Virgel Manifold, MD  oxyCODONE-acetaminophen (PERCOCET/ROXICET) 5-325 MG tablet Take 2 tablets by mouth every 6 (six) hours as needed for severe pain. Patient not taking: Reported on 05/09/2018 05/08/18   Hayden Rasmussen, MD  SUBOXONE 8-2 MG FILM Place 2 Film under the tongue daily. 05/01/18   [provider]    Review of Systems:  Constitutional:  No weight loss, night sweats,  Fevers, chills, fatigue.  Head&Eyes: No headache.  No vision loss.  No eye pain or scotoma ENT:  No Difficulty swallowing,Tooth/dental problems,Sore throat,  No ear ache, post nasal drip,  Cardio-vascular:  No chest pain, Orthopnea, PND, swelling in lower extremities,  dizziness, palpitations  GI:  No  hematochezia, melena, heartburn, indigestion, Resp:  No shortness of breath with exertion or at rest. No cough. No coughing up of blood .No wheezing.No chest wall deformity  Skin:  no rash or lesions.  GU:  no dysuria, change in color of urine, no urgency or frequency. No flank pain.  Musculoskeletal:  No joint pain or swelling. No decreased range of motion. No back pain.  Psych:  No change in mood or affect. No depression or anxiety. Neurologic: No headache, no dysesthesia, no focal weakness, no vision loss. No syncope  Physical Exam: Vitals:   05/09/18 0840 05/09/18 0900 05/09/18 0930 05/09/18  1011  BP:  (!) 150/84 125/73 121/74  Pulse:  61 60 (!) 59  Resp:  18  15  Temp:  98.1 F (36.7 C)    TempSrc:  Oral    SpO2:  96% 98% 94%  Weight: 83.9 kg (185 lb)     Height: 5\' 6"  (1.676 m)      General:  A&O x 3, NAD, nontoxic, pleasant/cooperative Head/Eye: No conjunctival hemorrhage, no icterus, [place]/AT, No nystagmus ENT:  No icterus,  No thrush, good dentition, no pharyngeal exudate Neck:  No masses, no lymphadenpathy, no bruits CV:  RRR, no rub, no gallop, no S3 Lung:  CTAB, good air movement, no wheeze, no rhonchi Abdomen: soft/generalized abd pain, +BS, nondistended, no peritoneal signs Ext: No cyanosis, No rashes, No petechiae, No lymphangitis, No edema Neuro: CNII-XII intact, strength 4/5 in bilateral upper and lower extremities, no dysmetria  Labs on Admission:  Basic Metabolic Panel: Recent Labs  Lab 05/07/18 1045 05/08/18 1628 05/09/18 0945  NA 139 138 137  K 4.1 3.4* 3.4*  CL 107 105 106  CO2 27 27 25   GLUCOSE 128* 136* 124*  BUN 10 7 7   CREATININE 0.81 0.75 0.68  CALCIUM 8.4* 8.8* 8.5*   Liver Function Tests: Recent Labs  Lab 05/07/18 1045 05/08/18 1628 05/09/18 0945  AST 12* 11* 10*  ALT 11 10 11   ALKPHOS 66 64 61  BILITOT 1.0 1.1 0.9  PROT 6.3* 6.6 6.6  ALBUMIN 3.4* 3.5 3.6   Recent Labs  Lab 05/07/18 1045 05/08/18 1628 05/09/18 0945  LIPASE 33 36 33   No results for input(s): AMMONIA in the last 168 hours. CBC: Recent Labs  Lab 05/07/18 1045 05/08/18 1628 05/09/18 0945  WBC 11.4* 8.4 12.9*  NEUTROABS 9.3* 6.0 10.8*  HGB 14.6 14.0 13.9  HCT 43.7 42.2 41.3  MCV 90.3 90.2 90.0  PLT 186 178 192   Coagulation Profile: No results for input(s): INR, PROTIME in the last 168 hours. Cardiac Enzymes: No results for input(s): CKTOTAL, CKMB, CKMBINDEX, TROPONINI in the last 168 hours. BNP: Invalid input(s): POCBNP CBG: No results for input(s): GLUCAP in the last 168 hours. Urine analysis:    Component Value Date/Time   COLORURINE  YELLOW 05/08/2018 1630   APPEARANCEUR CLEAR 05/08/2018 1630   LABSPEC 1.016 05/08/2018 1630   PHURINE 6.0 05/08/2018 1630   GLUCOSEU NEGATIVE 05/08/2018 1630   HGBUR NEGATIVE 05/08/2018 1630   BILIRUBINUR NEGATIVE 05/08/2018 1630   KETONESUR 5 (A) 05/08/2018 1630   PROTEINUR NEGATIVE 05/08/2018 1630   UROBILINOGEN 0.2 06/27/2015 1614   NITRITE  NEGATIVE 05/08/2018 1630   LEUKOCYTESUR NEGATIVE 05/08/2018 1630   Sepsis Labs: @LABRCNTIP (procalcitonin:4,lacticidven:4) )No results found for this or any previous visit (from the past 240 hour(s)).   Radiological Exams on Admission: Ct Abdomen Pelvis W Contrast  Result Date: 05/08/2018 CLINICAL DATA:  Per ED notes: Patient c/o upper abd pain/spasms x2 days. Patient states nausea, vomiting, diarrhea, and intermittent fevers. Denies any urinary symptoms EXAM: CT ABDOMEN AND PELVIS WITH CONTRAST TECHNIQUE: Multidetector CT imaging of the abdomen and pelvis was performed using the standard protocol following bolus administration of intravenous contrast. CONTRAST:  142mL ISOVUE-300 IOPAMIDOL (ISOVUE-300) INJECTION 61% COMPARISON:  CT of the abdomen and pelvis on 03/16/2018 FINDINGS: Lower chest: No pulmonary nodules, pleural effusions, or infiltrates. Heart size is normal. Hepatobiliary: No pericardial effusion or significant coronary artery calcifications. The there is mild intrahepatic biliary duct dilatation. Common bile duct is 10 millimeters. Gallbladder appears contracted. Gallbladder wall is thickened or there is small amount of pericholecystic fluid. Pancreas: There is increased, mild dilatation of the pancreatic duct. Spleen: Normal in size without focal abnormality. Adrenals/Urinary Tract: Adrenal glands are normal in appearance. The RIGHT kidney is diminutive and there are linear areas of scarring throughout the kidney. No hydronephrosis. The LEFT kidney is normal in appearance. No suspicious renal mass. The urinary bladder is decompressed.  Stomach/Bowel: The stomach is normal in appearance. There is bowel wall thickening and inflammation involving the majority of the small bowel, beginning with the jejunum and extending to the terminal ileum. The findings are diffuse and symmetric. There are small collections of mesenteric fluid. The colon is unremarkable. There is a small amount of fluid surrounding the appendix but this appears to be a secondary process. The appendix is normal in diameter. Vascular/Lymphatic: No significant vascular findings are present. No enlarged abdominal or pelvic lymph nodes. Reproductive: Uterus is present.  No adnexal mass. Other: There is a small amount of free pelvic fluid, increased since prior study. Musculoskeletal: No acute or significant osseous findings. IMPRESSION: 1. Significant small bowel abnormality without change in distribution. Findings are consistent with enteritis of the mid and distal small bowel. No evidence for perforation or abscess. 2. Increased ascites. 3. Increased intrahepatic and pancreatic duct dilatation. 4. Question of gallbladder wall thickening versus pericholecystic fluid. Consider further evaluation with RIGHT UPPER QUADRANT ultrasound. Electronically Signed   By: Nolon Nations M.D.   On: 05/08/2018 18:01   US Abdomen Limited Ruq  Result Date: 05/09/2018 CLINICAL DATA:  Right upper quadrant abdominal pain for 1 week with nausea, vomiting and diarrhea. EXAM: ULTRASOUND ABDOMEN LIMITED RIGHT UPPER QUADRANT COMPARISON:  CT 05/08/2018. FINDINGS: Gallbladder: Multiple gallstones are again noted. The gallbladder is better distended and demonstrates no wall thickening. However, the patient is reported to have a sonographic Murphy's sign by the sonographer. Common bile duct: Diameter: 7 mm.  Less dilated than yesterday's CT. Liver: No focal lesion identified. Within normal limits in parenchymal echogenicity. Portal vein is patent on color Doppler imaging with normal direction of blood flow  towards the liver. IMPRESSION: 1. Cholelithiasis without objective signs of cholecystitis. The patient is reported to have a sonographic Murphy sign, however. 2. Mild biliary dilatation, improved from recent CT. Electronically Signed   By: Richardean Sale M.D.   On: 05/09/2018 10:17       Time spent:60 minutes Code Status:   FULL Family Communication:  No Family at bedside Disposition Plan: expect 2-3 day hospitalization Consults called: general surgery  DVT Prophylaxis: [place] Heparin     Shanon Brow  Dontavius Keim, DO  [facility] Pager [phone]  If 7PM-7AM, please contact night-coverage [email or web address] Password [email or web address] 05/09/2018, 11:54 AM

## 2018-05-09 NOTE — Consult Note (Addendum)
Reason for Consult: Right upper quadrant abdominal pain, cholelithiasis Referring Physician: Dr. Adron Bene [name] is an 47 y.o. female.  HPI: Patient is a 47 year old white female with multiple medical problems including fibromyalgia, chronic pain, and arthritis who presents with a one-week history of nausea and diarrhea.  Patient states that she has had significant pain in the right upper quadrant with vomiting.  She was seen in the emergency room twice.  Today, ultrasound gallbladder revealed cholelithiasis, cholecystitis, and a 7 mm common bile duct.  A CT scan previously showed dilatation of the hepatobiliary tree.  Patient was also found to have distal small bowel enteritis.  She states the diarrhea seems to have resolved.  She denies any fevers over the last 24 hours.  She does have a leukocytosis of 12.9.  She currently has 7 out of 10 pain after receiving pain medication.  Past Medical History:  Diagnosis Date  . Arthritis    hands, knees  . Endometrial polyp   . Fibromyalgia   . Headache(784.0)   . Pregnancy   . SVD (spontaneous vaginal delivery)    x 1    Past Surgical History:  Procedure Laterality Date  . CESAREAN SECTION     x 4  . right thumb surgery     pins  . WISDOM TOOTH EXTRACTION      Family History  Problem Relation Age of Onset  . Hypertension Mother   . Diabetes Mother   . Cancer Mother        lung  . CAD Father   . Heart failure Father   . Heart disease Father   . Seizures Son   . Diabetes Other   . Heart failure Other   . Hypertension Other   . Cancer Other     Social History:  reports that she has been smoking cigarettes.  She has a 5.00 pack-year smoking history. She has never used smokeless tobacco. She reports that she does not drink alcohol or use drugs.  Allergies:  Allergies  Allergen Reactions  . Aspirin Shortness Of Breath  . Nsaids Shortness Of Breath  . Latex Swelling  . Morphine And Related Itching  . Phenergan [Promethazine]  Other (See Comments)    Somnolence   . Penicillins Rash and Other (See Comments)    Pt states that she is allergic to all -cillins.  Has patient had a PCN reaction causing immediate rash, facial/tongue/throat swelling, SOB or lightheadedness with hypotension: No Has patient had a PCN reaction causing severe rash involving mucus membranes or skin necrosis: No Has patient had a PCN reaction that required hospitalization: No Has patient had a PCN reaction occurring within the last 10 years: No If all of the above answers are "NO", then may proceed with Cephalosporin use.  . Toradol [Ketorolac Tromethamine] Nausea And Vomiting    Medications: I have reviewed the patient's current medications.  Results for orders placed or performed during the hospital encounter of 05/09/18 (from the past 48 hour(s))  Comprehensive metabolic panel     Status: Abnormal   Collection Time: 05/09/18  9:45 AM  Result Value Ref Range   Sodium 137 135 - 145 mmol/L   Potassium 3.4 (L) 3.5 - 5.1 mmol/L   Chloride 106 98 - 111 mmol/L   CO2 25 22 - 32 mmol/L   Glucose, Bld 124 (H) 70 - 99 mg/dL   BUN 7 6 - 20 mg/dL   Creatinine, Ser 0.68 0.44 - 1.00 mg/dL  Calcium 8.5 (L) 8.9 - 10.3 mg/dL   Total Protein 6.6 6.5 - 8.1 g/dL   Albumin 3.6 3.5 - 5.0 g/dL   AST 10 (L) 15 - 41 U/L   ALT 11 0 - 44 U/L   Alkaline Phosphatase 61 38 - 126 U/L   Total Bilirubin 0.9 0.3 - 1.2 mg/dL   GFR calc non Af Amer >60 >60 mL/min   GFR calc Af Amer >60 >60 mL/min    Comment: (NOTE) The eGFR has been calculated using the CKD EPI equation. This calculation has not been validated in all clinical situations. eGFR's persistently <60 mL/min signify possible Chronic Kidney Disease.    Anion gap 6 5 - 15    Comment: Performed at [facility], [address] [place], [place] [address]  Lipase, blood     Status: None   Collection Time: 05/09/18  9:45 AM  Result Value Ref Range   Lipase 33 11 - 51 U/L    Comment: Performed at [facility], [address], [place], [place] [address]  CBC with Differential     Status: Abnormal   Collection Time: 05/09/18  9:45 AM  Result Value Ref Range   WBC 12.9 (H) 4.0 - 10.5 K/uL   RBC 4.59 3.87 - 5.11 MIL/uL   Hemoglobin 13.9 12.0 - 15.0 g/dL   HCT 41.3 36.0 - 46.0 %   MCV 90.0 78.0 - 100.0 fL   MCH 30.3 26.0 - 34.0 pg   MCHC 33.7 30.0 - 36.0 g/dL   RDW 12.4 11.5 - 15.5 %   Platelets 192 150 - 400 K/uL   Neutrophils Relative % 84 %   Neutro Abs 10.8 (H) 1.7 - 7.7 K/uL   Lymphocytes Relative 11 %   Lymphs Abs 1.4 0.7 - 4.0 K/uL   Monocytes Relative 3 %   Monocytes Absolute 0.4 0.1 - 1.0 K/uL   Eosinophils Relative 2 %   Eosinophils Absolute 0.2 0.0 - 0.7 K/uL   Basophils Relative 0 %   Basophils Absolute 0.0 0.0 - 0.1 K/uL    Comment: Performed at [facility], [address], [place], [place] [address]    Ct Abdomen Pelvis W Contrast  Result Date: 05/08/2018 CLINICAL DATA:  Per ED notes: Patient c/o upper abd pain/spasms x2 days. Patient states nausea, vomiting, diarrhea, and intermittent fevers. Denies any urinary symptoms EXAM: CT ABDOMEN AND PELVIS WITH CONTRAST TECHNIQUE: Multidetector CT imaging of the abdomen and pelvis was performed using the standard protocol following bolus administration of intravenous contrast. CONTRAST:  138m ISOVUE-300 IOPAMIDOL (ISOVUE-300) INJECTION 61% COMPARISON:  CT of the abdomen and pelvis on 03/16/2018 FINDINGS: Lower chest: No pulmonary nodules, pleural effusions, or infiltrates. Heart size is normal. Hepatobiliary: No pericardial effusion or significant coronary artery calcifications. The there is mild intrahepatic biliary duct dilatation. Common bile duct is 10 millimeters. Gallbladder appears contracted. Gallbladder wall is thickened or there is small amount of pericholecystic fluid. Pancreas: There is increased, mild dilatation of the pancreatic duct. Spleen: Normal in size without focal abnormality. Adrenals/Urinary Tract: Adrenal glands  are normal in appearance. The RIGHT kidney is diminutive and there are linear areas of scarring throughout the kidney. No hydronephrosis. The LEFT kidney is normal in appearance. No suspicious renal mass. The urinary bladder is decompressed. Stomach/Bowel: The stomach is normal in appearance. There is bowel wall thickening and inflammation involving the majority of the small bowel, beginning with the jejunum and extending to the terminal ileum. The findings are diffuse and  symmetric. There are small collections of mesenteric fluid. The colon is unremarkable. There is a small amount of fluid surrounding the appendix but this appears to be a secondary process. The appendix is normal in diameter. Vascular/Lymphatic: No significant vascular findings are present. No enlarged abdominal or pelvic lymph nodes. Reproductive: Uterus is present.  No adnexal mass. Other: There is a small amount of free pelvic fluid, increased since prior study. Musculoskeletal: No acute or significant osseous findings. IMPRESSION: 1. Significant small bowel abnormality without change in distribution. Findings are consistent with enteritis of the mid and distal small bowel. No evidence for perforation or abscess. 2. Increased ascites. 3. Increased intrahepatic and pancreatic duct dilatation. 4. Question of gallbladder wall thickening versus pericholecystic fluid. Consider further evaluation with RIGHT UPPER QUADRANT ultrasound. Electronically Signed   By: Nolon Nations M.D.   On: 05/08/2018 18:01   US Abdomen Limited Ruq  Result Date: 05/09/2018 CLINICAL DATA:  Right upper quadrant abdominal pain for 1 week with nausea, vomiting and diarrhea. EXAM: ULTRASOUND ABDOMEN LIMITED RIGHT UPPER QUADRANT COMPARISON:  CT 05/08/2018. FINDINGS: Gallbladder: Multiple gallstones are again noted. The gallbladder is better distended and demonstrates no wall thickening. However, the patient is reported to have a sonographic Murphy's sign by the  sonographer. Common bile duct: Diameter: 7 mm.  Less dilated than yesterday's CT. Liver: No focal lesion identified. Within normal limits in parenchymal echogenicity. Portal vein is patent on color Doppler imaging with normal direction of blood flow towards the liver. IMPRESSION: 1. Cholelithiasis without objective Farley of cholecystitis. The patient is reported to have a sonographic Murphy sign, however. 2. Mild biliary dilatation, improved from recent CT. Electronically Signed   By: Richardean Sale M.D.   On: 05/09/2018 10:17    ROS:  Pertinent items are noted in HPI.  Blood pressure (!) 144/73, pulse (!) 58, temperature (!) 97.5 F (36.4 C), temperature source Oral, resp. rate 16, height 5' 6" (1.676 m), weight 192 lb 0.3 oz (87.1 kg), last menstrual period 04/19/2018, SpO2 99 %. Physical Exam: Pleasant well-developed well-nourished white female no acute distress Head is normocephalic, atraumatic Eyes without scleral icterus Lungs are clear to auscultation with equal breath sounds bilaterally Heart examination reveals regular rate and rhythm without S3, S4, murmurs Abdomen is soft with tenderness in the right upper quadrant to palpation.  No rigidity is noted.  No hernias are noted. CT scan images personally reviewed Labs and ultrasound reports reviewed  Assessment/Plan: Impression: Cholecystitis, cholelithiasis, possible hepatobiliary dilatation, enteritis.  Labs are negative for gallstone pancreatitis. Plan: Agree with IV antibiotics.  Patient has been ordered an MRCP for tomorrow.  Further management is pending those results.  The patient will require a cholecystectomy prior to discharge.  Discussed with Dr. Carles Collet.  Dr. Blake Divine will be taking over care for this patient tomorrow.  Patient is aware.  [name] 05/09/2018, 5:32 PM

## 2018-05-09 NOTE — ED Triage Notes (Signed)
C/o right abdominal pain.  Pt was here in ED yesterday and not able to be admitted d/t child care issues.  Return to ED today for same issues as yesterday right abdominal pain, guarding right side.

## 2018-05-09 NOTE — ED Provider Notes (Signed)
[facility] EMERGENCY DEPARTMENT Provider Note   CSN: [id number] Arrival date & time: 05/09/18  3810     History   Chief Complaint Chief Complaint  Patient presents with  . Abdominal Pain    HPI [name] is a 47 y.o. female.  HPI   [name] is a 47 y.o. female who was evaluated in the emergency department yesterday for upper abdominal pain, returns this morning for worsening upper abdominal pain and persistent nausea and vomiting.  She was seen here yesterday and evaluated with laboratory studies and CT abdomen and pelvis which revealed questionable gallbladder wall thickening and possible enteritis of the mid and distal small bowel, no evidence of perforation or abscess.  It was recommended that she be admitted for further evaluation yesterday, but patient was unable able to stay due to a situation at home.  She was scheduled to return this morning for an outpatient ultrasound of her abdomen, but upon waking the pain was worse, so she came back in for further evaluation.  She denies diarrhea or fever, chest pain or shortness of breath.  Pain does not radiate into her shoulder or back. She states that she is able to drink water and small amounts of liquid but has not eaten solid food in 2 days.  Surgical history includes C sections, without additional abdominal surgeries.     Past Medical History:  Diagnosis Date  . Arthritis    hands, knees  . Endometrial polyp   . Fibromyalgia   . Headache(784.0)   . Pregnancy   . SVD (spontaneous vaginal delivery)    x 1    Patient Active Problem List   Diagnosis Date Noted  . Menorrhagia with regular cycle 03/19/2017  . Adjustment disorder with mixed anxiety and depressed mood 02/21/2015  . Dysmenorrhea 01/08/2015  . Dyspareunia 01/08/2015  . Uterus retroversion 01/08/2015  . Sterilization 06/25/2012    Past Surgical History:  Procedure Laterality Date  . CESAREAN SECTION     x 4  . right thumb surgery     pins  .  WISDOM TOOTH EXTRACTION       OB History    Gravida  7   Para  6   Term  6   Preterm      AB  1   Living  6     SAB  1   TAB      Ectopic      Multiple      Live Births  1            Home Medications    Prior to Admission medications   Medication Sig Start Date End Date Taking? Authorizing Provider  calcium carbonate (TUMS - DOSED IN MG ELEMENTAL CALCIUM) 500 MG chewable tablet Chew 1 tablet by mouth 3 (three) times daily as needed for indigestion or heartburn.    [provider]  ciprofloxacin (CIPRO) 500 MG/5ML (10%) suspension Take 5 mLs (500 mg total) by mouth 2 (two) times daily. Patient not taking: Reported on 05/07/2018 03/20/18   Larene Pickett, PA-C  dicyclomine (BENTYL) 20 MG tablet Take 1 tablet (20 mg total) by mouth every 6 (six) hours as needed for spasms. 05/07/18   Virgel Manifold, MD  HYDROcodone-acetaminophen (HYCET) 7.5-325 mg/15 ml solution Take 15 mLs by mouth every 8 (eight) hours as needed for moderate pain. Patient not taking: Reported on 05/08/2018 03/20/18   Larene Pickett, PA-C  metoCLOPramide (REGLAN) 10 MG tablet Take  1 tablet (10 mg total) by mouth every 8 (eight) hours as needed for nausea or vomiting. Patient not taking: Reported on 05/08/2018 03/16/18   Francine Graven, DO  metroNIDAZOLE (FLAGYL) 50 mg/ml oral suspension Take 10 mLs (500 mg total) by mouth 2 (two) times daily. Patient not taking: Reported on 05/07/2018 03/20/18   Larene Pickett, PA-C  omeprazole (PRILOSEC) 40 MG capsule TAKE ONE CAPSULE BY MOUTH DAILY. Patient not taking: Reported on 05/08/2018 12/14/17   Jonnie Kind, MD  ondansetron (ZOFRAN ODT) 4 MG disintegrating tablet Take 1 tablet (4 mg total) by mouth every 8 (eight) hours as needed for nausea. Patient not taking: Reported on 05/08/2018 03/20/18   Larene Pickett, PA-C  ondansetron (ZOFRAN) 4 MG tablet TAKE (1) TABLET BY MOUTH EVERY (6) HOURS AS NEEDED FOR NAUSEA. Patient not taking: Reported on 05/08/2018 04/13/17    Florian Buff, MD  ondansetron (ZOFRAN) 4 MG tablet Take 1 tablet (4 mg total) by mouth every 6 (six) hours. 05/07/18   Virgel Manifold, MD  oxyCODONE-acetaminophen (PERCOCET/ROXICET) 5-325 MG tablet Take 2 tablets by mouth every 6 (six) hours as needed for severe pain. 05/08/18   Hayden Rasmussen, MD  SUBOXONE 8-2 MG FILM Place 2 Film under the tongue daily. 05/01/18   [provider]    Family History Family History  Problem Relation Age of Onset  . Hypertension Mother   . Diabetes Mother   . Cancer Mother        lung  . CAD Father   . Heart failure Father   . Heart disease Father   . Seizures Son   . Diabetes Other   . Heart failure Other   . Hypertension Other   . Cancer Other     Social History Social History   Tobacco Use  . Smoking status: Current Every Day Smoker    Packs/day: 0.50    Years: 10.00    Pack years: 5.00    Types: Cigarettes  . Smokeless tobacco: Never Used  . Tobacco comment: 5-6 cigarettes a day  Substance Use Topics  . Alcohol use: No    Alcohol/week: 0.0 oz  . Drug use: No     Allergies   Aspirin; Nsaids; Latex; Morphine and related; Phenergan [promethazine]; Penicillins; and Toradol [ketorolac tromethamine]   Review of Systems Review of Systems  Constitutional: Positive for appetite change. Negative for chills and fever.  Respiratory: Negative for shortness of breath.   Cardiovascular: Negative for chest pain.  Gastrointestinal: Positive for abdominal pain, nausea and vomiting. Negative for diarrhea.  Genitourinary: Negative for decreased urine volume, difficulty urinating, dysuria and flank pain.  Musculoskeletal: Negative for back pain.  Skin: Negative for color change and rash.  Neurological: Negative for dizziness, weakness and numbness.  Hematological: Negative for adenopathy.  All other systems reviewed and are negative.    Physical Exam Updated Vital Signs BP 121/74   Pulse (!) 59   Temp 98.1 F (36.7 C) (Oral)    Resp 15   Ht 5\' 6"  (1.676 m)   Wt 83.9 kg (185 lb)   LMP 04/19/2018 Comment: neg hcg 05/07/18  SpO2 94%   BMI 29.86 kg/m   Physical Exam  Constitutional: She is oriented to person, place, and time. She appears well-developed.  Pt is uncomfortable appearing  HENT:  Head: Normocephalic and atraumatic.  Mouth/Throat: Mucous membranes are dry (mucous membranes are slightly dry).  Cardiovascular: Normal rate, regular rhythm, normal heart sounds and intact distal pulses.  No murmur heard. Pulmonary/Chest: Effort normal and breath sounds normal. No respiratory distress.  Abdominal: Soft. Bowel sounds are normal. She exhibits no distension and no mass. There is tenderness. There is no rebound and no guarding.  Musculoskeletal: Normal range of motion. She exhibits no edema.  Neurological: She is alert and oriented to person, place, and time. She exhibits normal muscle tone. Coordination normal.  Skin: Skin is warm and dry. Capillary refill takes less than 2 seconds.  Psychiatric: She has a normal mood and affect.  Nursing note and vitals reviewed.    ED Treatments / Results  Labs (all labs ordered are listed, but only abnormal results are displayed) Labs Reviewed  COMPREHENSIVE METABOLIC PANEL - Abnormal; Notable for the following components:      Result Value   Potassium 3.4 (*)    Glucose, Bld 124 (*)    Calcium 8.5 (*)    AST 10 (*)    All other components within normal limits  CBC WITH DIFFERENTIAL/PLATELET - Abnormal; Notable for the following components:   WBC 12.9 (*)    Neutro Abs 10.8 (*)    All other components within normal limits  LIPASE, BLOOD    EKG None  Radiology Ct Abdomen Pelvis W Contrast  Result Date: 05/08/2018 CLINICAL DATA:  Per ED notes: Patient c/o upper abd pain/spasms x2 days. Patient states nausea, vomiting, diarrhea, and intermittent fevers. Denies any urinary symptoms EXAM: CT ABDOMEN AND PELVIS WITH CONTRAST TECHNIQUE: Multidetector CT imaging of  the abdomen and pelvis was performed using the standard protocol following bolus administration of intravenous contrast. CONTRAST:  136mL ISOVUE-300 IOPAMIDOL (ISOVUE-300) INJECTION 61% COMPARISON:  CT of the abdomen and pelvis on 03/16/2018 FINDINGS: Lower chest: No pulmonary nodules, pleural effusions, or infiltrates. Heart size is normal. Hepatobiliary: No pericardial effusion or significant coronary artery calcifications. The there is mild intrahepatic biliary duct dilatation. Common bile duct is 10 millimeters. Gallbladder appears contracted. Gallbladder wall is thickened or there is small amount of pericholecystic fluid. Pancreas: There is increased, mild dilatation of the pancreatic duct. Spleen: Normal in size without focal abnormality. Adrenals/Urinary Tract: Adrenal glands are normal in appearance. The RIGHT kidney is diminutive and there are linear areas of scarring throughout the kidney. No hydronephrosis. The LEFT kidney is normal in appearance. No suspicious renal mass. The urinary bladder is decompressed. Stomach/Bowel: The stomach is normal in appearance. There is bowel wall thickening and inflammation involving the majority of the small bowel, beginning with the jejunum and extending to the terminal ileum. The findings are diffuse and symmetric. There are small collections of mesenteric fluid. The colon is unremarkable. There is a small amount of fluid surrounding the appendix but this appears to be a secondary process. The appendix is normal in diameter. Vascular/Lymphatic: No significant vascular findings are present. No enlarged abdominal or pelvic lymph nodes. Reproductive: Uterus is present.  No adnexal mass. Other: There is a small amount of free pelvic fluid, increased since prior study. Musculoskeletal: No acute or significant osseous findings. IMPRESSION: 1. Significant small bowel abnormality without change in distribution. Findings are consistent with enteritis of the mid and distal small  bowel. No evidence for perforation or abscess. 2. Increased ascites. 3. Increased intrahepatic and pancreatic duct dilatation. 4. Question of gallbladder wall thickening versus pericholecystic fluid. Consider further evaluation with RIGHT UPPER QUADRANT ultrasound. Electronically Signed   By: Nolon Nations M.D.   On: 05/08/2018 18:01   US Abdomen Limited Ruq  Result Date: 05/09/2018 CLINICAL DATA:  Right  upper quadrant abdominal pain for 1 week with nausea, vomiting and diarrhea. EXAM: ULTRASOUND ABDOMEN LIMITED RIGHT UPPER QUADRANT COMPARISON:  CT 05/08/2018. FINDINGS: Gallbladder: Multiple gallstones are again noted. The gallbladder is better distended and demonstrates no wall thickening. However, the patient is reported to have a sonographic Murphy's sign by the sonographer. Common bile duct: Diameter: 7 mm.  Less dilated than yesterday's CT. Liver: No focal lesion identified. Within normal limits in parenchymal echogenicity. Portal vein is patent on color Doppler imaging with normal direction of blood flow towards the liver. IMPRESSION: 1. Cholelithiasis without objective signs of cholecystitis. The patient is reported to have a sonographic Murphy sign, however. 2. Mild biliary dilatation, improved from recent CT. Electronically Signed   By: Richardean Sale M.D.   On: 05/09/2018 10:17    Procedures Procedures (including critical care time)  Medications Ordered in ED Medications  sodium chloride 0.9 % bolus 1,000 mL (1,000 mLs Intravenous New Bag/Given 05/09/18 1006)  ondansetron (ZOFRAN) injection 4 mg (4 mg Intravenous Given 05/09/18 1005)  HYDROmorphone (DILAUDID) injection 0.5 mg (0.5 mg Intravenous Given 05/09/18 1005)     Initial Impression / Assessment and Plan / ED Course  I have reviewed the triage vital signs and the nursing notes.  Pertinent labs & imaging results that were available during my care of the patient were reviewed by me and considered in my medical decision making (see  chart for details).    Pt here yesterday with same, returned this morning due to persistent upper abdominal pain and vomiting.  Lab and CT abd/pelvis results from yesterday reviewed by me.  Will recheck baseline labs and get ultrasound.    [place] Arnoldo Morale, requests hospitalist admit, he will consult and have pt NPO after midnight.    Consulted hospitalist, Dr. Carles Collet and discussed findings.  He agrees to admit.   Final Clinical Impressions(s) / ED Diagnoses   Final diagnoses:  Pain  Right upper quadrant abdominal pain    ED Discharge Orders    None       Kem Parkinson, PA-C 05/09/18 [place], [place], DO 05/11/18 1604

## 2018-05-10 ENCOUNTER — Inpatient Hospital Stay (HOSPITAL_COMMUNITY): Payer: Medicare Other

## 2018-05-10 DIAGNOSIS — K810 Acute cholecystitis: Secondary | ICD-10-CM

## 2018-05-10 LAB — COMPREHENSIVE METABOLIC PANEL
ALBUMIN: 3.3 g/dL — AB (ref 3.5–5.0)
ALT: 8 U/L (ref 0–44)
AST: 9 U/L — ABNORMAL LOW (ref 15–41)
Alkaline Phosphatase: 60 U/L (ref 38–126)
Anion gap: 5 (ref 5–15)
BUN: 5 mg/dL — ABNORMAL LOW (ref 6–20)
CALCIUM: 8.2 mg/dL — AB (ref 8.9–10.3)
CO2: 25 mmol/L (ref 22–32)
Chloride: 107 mmol/L (ref 98–111)
Creatinine, Ser: 0.65 mg/dL (ref 0.44–1.00)
GFR calc Af Amer: >60 mL/min (ref >60)
GFR calc non Af Amer: >60 mL/min (ref >60)
Glucose, Bld: 80 mg/dL (ref 70–99)
Potassium: 3.5 mmol/L (ref 3.5–5.1)
SODIUM: 137 mmol/L (ref 135–145)
TOTAL PROTEIN: 6.1 g/dL — AB (ref 6.5–8.1)
Total Bilirubin: 0.8 mg/dL (ref 0.3–1.2)

## 2018-05-10 LAB — CBC
HCT: 40.8 % (ref 36.0–46.0)
HEMOGLOBIN: 13.1 g/dL (ref 12.0–15.0)
MCH: 29.4 pg (ref 26.0–34.0)
MCHC: 32.1 g/dL (ref 30.0–36.0)
MCV: 91.7 fL (ref 78.0–100.0)
Platelets: 174 10*3/uL (ref 150–400)
RBC: 4.45 MIL/uL (ref 3.87–5.11)
RDW: 12.5 % (ref 11.5–15.5)
WBC: 12.3 10*3/uL — ABNORMAL HIGH (ref 4.0–10.5)

## 2018-05-10 LAB — SURGICAL PCR SCREEN
MRSA, PCR: NEGATIVE
Staphylococcus aureus: NEGATIVE

## 2018-05-10 LAB — HIV ANTIBODY (ROUTINE TESTING W REFLEX): HIV Screen 4th Generation wRfx: NONREACTIVE

## 2018-05-10 LAB — MAGNESIUM: MAGNESIUM: 1.8 mg/dL (ref 1.7–2.4)

## 2018-05-10 MED ORDER — CHLORHEXIDINE GLUCONATE CLOTH 2 % EX PADS
6.0000 | MEDICATED_PAD | Freq: Once | CUTANEOUS | Status: AC
  Administered 2018-05-10: 6 via TOPICAL

## 2018-05-10 MED ORDER — GADOBENATE DIMEGLUMINE 529 MG/ML IV SOLN
15.0000 mL | Freq: Once | INTRAVENOUS | Status: AC | PRN
  Administered 2018-05-10: 15 mL via INTRAVENOUS

## 2018-05-10 MED ORDER — CHLORHEXIDINE GLUCONATE CLOTH 2 % EX PADS
6.0000 | MEDICATED_PAD | Freq: Once | CUTANEOUS | Status: AC
  Administered 2018-05-11: 6 via TOPICAL

## 2018-05-10 MED ORDER — CIPROFLOXACIN IN D5W 400 MG/200ML IV SOLN
400.0000 mg | INTRAVENOUS | Status: AC
  Administered 2018-05-11: 400 mg via INTRAVENOUS
  Filled 2018-05-10: qty 200

## 2018-05-10 NOTE — H&P (View-Only) (Signed)
[facility] Progress Note     Subjective: Having some abdominal pain. MRCP without stone. CBD dilated but no obvious obstruction. Cholecystitis suspected. Patient says having some nausea too. Did have some kids at home that were sick with diarrheal illness. They improved in a few days. This is likely also going on for the patient in addition to the cholecystitis.   Objective: Vital signs in last 24 hours: Temp:  [98.2 F (36.8 C)-99.7 F (37.6 C)] 99.7 F (37.6 C) (08/05 1358) Pulse Rate:  [63-86] 86 (08/05 1358) Resp:  [18-19] 18 (08/05 1358) BP: (144-158)/(71-97) 158/97 (08/05 1358) SpO2:  [97 %-99 %] 97 % (08/05 1358) Last BM Date: 05/10/18  Intake/Output from previous day: 08/04 0701 - 08/05 0700 In: 2760 [P.O.:480; I.V.:1080; IV Piggyback:1200] Out: 1000 [Urine:1000] Intake/Output this shift: No intake/output data recorded.  General appearance: alert, cooperative and no distress Resp: normal work breathing GI: soft, mildly distended, tender in the RUQ and in the lower abdomen, no rebound or guarding  Lab Results:  Recent Labs    05/09/18 0945 05/10/18 0410  WBC 12.9* 12.3*  HGB 13.9 13.1  HCT 41.3 40.8  PLT 192 174   BMET Recent Labs    05/09/18 0945 05/10/18 0410  NA 137 137  K 3.4* 3.5  CL 106 107  CO2 25 25  GLUCOSE 124* 80  BUN 7 5*  CREATININE 0.68 0.65  CALCIUM 8.5* 8.2*    Personally reviewed CT and [place]- contracted gallbladder with fluid, bowel thickened, no free air or fluid; MRCP with dilated CBD but no stones    Studies/Results: Mr 3d Recon At Scanner  Result Date: 05/10/2018 CLINICAL DATA:  Abdominal pain with swelling and nausea. Intrahepatic and pancreatic duct dilatation with cholelithiasis along with small bowel wall thickening. EXAM: MRI ABDOMEN WITHOUT AND WITH CONTRAST (INCLUDING MRCP) TECHNIQUE: Multiplanar multisequence MR imaging of the abdomen was performed both before and after the administration of intravenous  contrast. Heavily T2-weighted images of the biliary and pancreatic ducts were obtained, and three-dimensional MRCP images were rendered by post processing. CONTRAST:  45mL MULTIHANCE GADOBENATE DIMEGLUMINE 529 MG/ML IV SOLN COMPARISON:  Multiple exams, including 05/08/2018 CT scan and ultrasound from 05/09/2018 FINDINGS: Despite efforts by the technologist and patient, motion artifact is present on today's exam and could not be eliminated. This reduces exam sensitivity and specificity. Lower chest: Unremarkable Hepatobiliary: Multiple filling defects in the gallbladder are present measuring up to 0.9 cm in long axis. Some of these are non dependent. There is dilatation of the common bile duct at 0.9 cm with distal conical tapering and without appreciable distal filling defect in the common bile duct, subject to the limitations of motion artifact. Minimal intrahepatic biliary dilatation. No irregularity of the biliary system or definite abnormal enhancement along the biliary walls. No pericholecystic fluid. There is an approximately 3 mm T2 hyperintense lesion laterally in the right hepatic lobe on image 20/5 without appreciable enhancement, probably a tiny cyst but technically too small to characterize. No worrisome abnormal enhancement in the liver. Pancreas: Currently no significant dorsal pancreatic duct dilatation. No abnormal pancreatic enhancement. Spleen:  Unremarkable Adrenals/Urinary Tract: Right renal atrophy and scarring. Adrenal glands normal. Stomach/Bowel: On the coronal images, some of the pelvic loops of thickened distal small bowel are identified, likely to include the terminal ileum. Some of these loops are borderline dilated. Vascular/Lymphatic:  Unremarkable Other: Trace perihepatic ascites posteriorly on images 24-27 of series 13. Musculoskeletal: Unremarkable IMPRESSION: 1. Cholelithiasis with dilated common bile  duct up to 9 mm in diameter, without current dilatation of the dorsal pancreatic  duct. Note that the patient has previously also had dilated common bile duct, up to 8 mm in diameter on 12/04/2016. No definite stone is identified in the common bile duct, with understanding that there is some degradation of negative predictive value due to motion artifact. Acute cholecystitis is not excluded. 2. Trace perihepatic ascites adjacent to the right hepatic lobe inferiorly. 3. No abnormal enhancement along the biliary tree to suggest cholangitis. 4. Thickened distal small bowel loops, including the terminal ileum, potentially from infectious enteritis or inflammatory bowel disease. 5. Right renal scarring and resulting atrophy. Electronically Signed   By: Van Clines M.D.   On: 05/10/2018 09:33   Mr Abdomen Mrcp Moise Boring Contast  Result Date: 05/10/2018 CLINICAL DATA:  Abdominal pain with swelling and nausea. Intrahepatic and pancreatic duct dilatation with cholelithiasis along with small bowel wall thickening. EXAM: MRI ABDOMEN WITHOUT AND WITH CONTRAST (INCLUDING MRCP) TECHNIQUE: Multiplanar multisequence MR imaging of the abdomen was performed both before and after the administration of intravenous contrast. Heavily T2-weighted images of the biliary and pancreatic ducts were obtained, and three-dimensional MRCP images were rendered by post processing. CONTRAST:  48mL MULTIHANCE GADOBENATE DIMEGLUMINE 529 MG/ML IV SOLN COMPARISON:  Multiple exams, including 05/08/2018 CT scan and ultrasound from 05/09/2018 FINDINGS: Despite efforts by the technologist and patient, motion artifact is present on today's exam and could not be eliminated. This reduces exam sensitivity and specificity. Lower chest: Unremarkable Hepatobiliary: Multiple filling defects in the gallbladder are present measuring up to 0.9 cm in long axis. Some of these are non dependent. There is dilatation of the common bile duct at 0.9 cm with distal conical tapering and without appreciable distal filling defect in the common bile duct,  subject to the limitations of motion artifact. Minimal intrahepatic biliary dilatation. No irregularity of the biliary system or definite abnormal enhancement along the biliary walls. No pericholecystic fluid. There is an approximately 3 mm T2 hyperintense lesion laterally in the right hepatic lobe on image 20/5 without appreciable enhancement, probably a tiny cyst but technically too small to characterize. No worrisome abnormal enhancement in the liver. Pancreas: Currently no significant dorsal pancreatic duct dilatation. No abnormal pancreatic enhancement. Spleen:  Unremarkable Adrenals/Urinary Tract: Right renal atrophy and scarring. Adrenal glands normal. Stomach/Bowel: On the coronal images, some of the pelvic loops of thickened distal small bowel are identified, likely to include the terminal ileum. Some of these loops are borderline dilated. Vascular/Lymphatic:  Unremarkable Other: Trace perihepatic ascites posteriorly on images 24-27 of series 13. Musculoskeletal: Unremarkable IMPRESSION: 1. Cholelithiasis with dilated common bile duct up to 9 mm in diameter, without current dilatation of the dorsal pancreatic duct. Note that the patient has previously also had dilated common bile duct, up to 8 mm in diameter on 12/04/2016. No definite stone is identified in the common bile duct, with understanding that there is some degradation of negative predictive value due to motion artifact. Acute cholecystitis is not excluded. 2. Trace perihepatic ascites adjacent to the right hepatic lobe inferiorly. 3. No abnormal enhancement along the biliary tree to suggest cholangitis. 4. Thickened distal small bowel loops, including the terminal ileum, potentially from infectious enteritis or inflammatory bowel disease. 5. Right renal scarring and resulting atrophy. Electronically Signed   By: Van Clines M.D.   On: 05/10/2018 09:33   US Abdomen Limited Ruq  Result Date: 05/09/2018 CLINICAL DATA:  Right upper quadrant  abdominal pain for  1 week with nausea, vomiting and diarrhea. EXAM: ULTRASOUND ABDOMEN LIMITED RIGHT UPPER QUADRANT COMPARISON:  CT 05/08/2018. FINDINGS: Gallbladder: Multiple gallstones are again noted. The gallbladder is better distended and demonstrates no wall thickening. However, the patient is reported to have a sonographic Murphy's sign by the sonographer. Common bile duct: Diameter: 7 mm.  Less dilated than yesterday's CT. Liver: No focal lesion identified. Within normal limits in parenchymal echogenicity. Portal vein is patent on color Doppler imaging with normal direction of blood flow towards the liver. IMPRESSION: 1. Cholelithiasis without objective signs of cholecystitis. The patient is reported to have a sonographic Murphy sign, however. 2. Mild biliary dilatation, improved from recent CT. Electronically Signed   By: Richardean Sale M.D.   On: 05/09/2018 10:17    Anti-infectives: Anti-infectives (From admission, onward)   Start     Dose/Rate Route Frequency Ordered Stop   05/09/18 1600  meropenem (MERREM) 1 g in sodium chloride 0.9 % 100 mL IVPB     1 g 200 mL/hr over 30 Minutes Intravenous Every 8 hours 05/09/18 1523        Assessment/Plan: Ms. Trentham is a 47 yo with acute cholecystitis. MRCP without stone.   -OR for lap chole tomorrow  -EKG and CXR ordered -Cipro ordered preop on meropenem now   PLAN: I counseled the patient about the indication, risks and benefits of laparoscopic cholecystectomy.  She understands there is a very small chance for bleeding, infection, injury to normal structures (including common bile duct), conversion to open surgery, persistent symptoms, evolution of postcholecystectomy diarrhea, need for secondary interventions, anesthesia reaction, cardiopulmonary issues and other risks not specifically detailed here. I described the expected recovery, the plan for follow-up and the restrictions during the recovery phase.  All questions were answered.    LOS:  1 day    Virl Cagey 05/10/2018

## 2018-05-10 NOTE — Care Management Important Message (Signed)
Important Message  Patient Details  Name: [name] MRN: [id number] Date of Birth: 1971-05-16   Medicare Important Message Given:  Yes    Shelda Altes 05/10/2018, 11:56 AM

## 2018-05-10 NOTE — Progress Notes (Signed)
**Note De-identified [place] Obfuscation** EKG complete and placed in patient chart 

## 2018-05-10 NOTE — Progress Notes (Signed)
[facility] Progress Note     Subjective: Having some abdominal pain. MRCP without stone. CBD dilated but no obvious obstruction. Cholecystitis suspected. Patient says having some nausea too. Did have some kids at home that were sick with diarrheal illness. They improved in a few days. This is likely also going on for the patient in addition to the cholecystitis.   Objective: Vital signs in last 24 hours: Temp:  [98.2 F (36.8 C)-99.7 F (37.6 C)] 99.7 F (37.6 C) (08/05 1358) Pulse Rate:  [63-86] 86 (08/05 1358) Resp:  [18-19] 18 (08/05 1358) BP: (144-158)/(71-97) 158/97 (08/05 1358) SpO2:  [97 %-99 %] 97 % (08/05 1358) Last BM Date: 05/10/18  Intake/Output from previous day: 08/04 0701 - 08/05 0700 In: 2760 [P.O.:480; I.V.:1080; IV Piggyback:1200] Out: 1000 [Urine:1000] Intake/Output this shift: No intake/output data recorded.  General appearance: alert, cooperative and no distress Resp: normal work breathing GI: soft, mildly distended, tender in the RUQ and in the lower abdomen, no rebound or guarding  Lab Results:  Recent Labs    05/09/18 0945 05/10/18 0410  WBC 12.9* 12.3*  HGB 13.9 13.1  HCT 41.3 40.8  PLT 192 174   BMET Recent Labs    05/09/18 0945 05/10/18 0410  NA 137 137  K 3.4* 3.5  CL 106 107  CO2 25 25  GLUCOSE 124* 80  BUN 7 5*  CREATININE 0.68 0.65  CALCIUM 8.5* 8.2*    Personally reviewed CT and [place]- contracted gallbladder with fluid, bowel thickened, no free air or fluid; MRCP with dilated CBD but no stones    Studies/Results: Mr 3d Recon At Scanner  Result Date: 05/10/2018 CLINICAL DATA:  Abdominal pain with swelling and nausea. Intrahepatic and pancreatic duct dilatation with cholelithiasis along with small bowel wall thickening. EXAM: MRI ABDOMEN WITHOUT AND WITH CONTRAST (INCLUDING MRCP) TECHNIQUE: Multiplanar multisequence MR imaging of the abdomen was performed both before and after the administration of intravenous  contrast. Heavily T2-weighted images of the biliary and pancreatic ducts were obtained, and three-dimensional MRCP images were rendered by post processing. CONTRAST:  81mL MULTIHANCE GADOBENATE DIMEGLUMINE 529 MG/ML IV SOLN COMPARISON:  Multiple exams, including 05/08/2018 CT scan and ultrasound from 05/09/2018 FINDINGS: Despite efforts by the technologist and patient, motion artifact is present on today's exam and could not be eliminated. This reduces exam sensitivity and specificity. Lower chest: Unremarkable Hepatobiliary: Multiple filling defects in the gallbladder are present measuring up to 0.9 cm in long axis. Some of these are non dependent. There is dilatation of the common bile duct at 0.9 cm with distal conical tapering and without appreciable distal filling defect in the common bile duct, subject to the limitations of motion artifact. Minimal intrahepatic biliary dilatation. No irregularity of the biliary system or definite abnormal enhancement along the biliary walls. No pericholecystic fluid. There is an approximately 3 mm T2 hyperintense lesion laterally in the right hepatic lobe on image 20/5 without appreciable enhancement, probably a tiny cyst but technically too small to characterize. No worrisome abnormal enhancement in the liver. Pancreas: Currently no significant dorsal pancreatic duct dilatation. No abnormal pancreatic enhancement. Spleen:  Unremarkable Adrenals/Urinary Tract: Right renal atrophy and scarring. Adrenal glands normal. Stomach/Bowel: On the coronal images, some of the pelvic loops of thickened distal small bowel are identified, likely to include the terminal ileum. Some of these loops are borderline dilated. Vascular/Lymphatic:  Unremarkable Other: Trace perihepatic ascites posteriorly on images 24-27 of series 13. Musculoskeletal: Unremarkable IMPRESSION: 1. Cholelithiasis with dilated common bile  duct up to 9 mm in diameter, without current dilatation of the dorsal pancreatic  duct. Note that the patient has previously also had dilated common bile duct, up to 8 mm in diameter on 12/04/2016. No definite stone is identified in the common bile duct, with understanding that there is some degradation of negative predictive value due to motion artifact. Acute cholecystitis is not excluded. 2. Trace perihepatic ascites adjacent to the right hepatic lobe inferiorly. 3. No abnormal enhancement along the biliary tree to suggest cholangitis. 4. Thickened distal small bowel loops, including the terminal ileum, potentially from infectious enteritis or inflammatory bowel disease. 5. Right renal scarring and resulting atrophy. Electronically Signed   By: Van Clines M.D.   On: 05/10/2018 09:33   Mr Abdomen Mrcp Moise Boring Contast  Result Date: 05/10/2018 CLINICAL DATA:  Abdominal pain with swelling and nausea. Intrahepatic and pancreatic duct dilatation with cholelithiasis along with small bowel wall thickening. EXAM: MRI ABDOMEN WITHOUT AND WITH CONTRAST (INCLUDING MRCP) TECHNIQUE: Multiplanar multisequence MR imaging of the abdomen was performed both before and after the administration of intravenous contrast. Heavily T2-weighted images of the biliary and pancreatic ducts were obtained, and three-dimensional MRCP images were rendered by post processing. CONTRAST:  41mL MULTIHANCE GADOBENATE DIMEGLUMINE 529 MG/ML IV SOLN COMPARISON:  Multiple exams, including 05/08/2018 CT scan and ultrasound from 05/09/2018 FINDINGS: Despite efforts by the technologist and patient, motion artifact is present on today's exam and could not be eliminated. This reduces exam sensitivity and specificity. Lower chest: Unremarkable Hepatobiliary: Multiple filling defects in the gallbladder are present measuring up to 0.9 cm in long axis. Some of these are non dependent. There is dilatation of the common bile duct at 0.9 cm with distal conical tapering and without appreciable distal filling defect in the common bile duct,  subject to the limitations of motion artifact. Minimal intrahepatic biliary dilatation. No irregularity of the biliary system or definite abnormal enhancement along the biliary walls. No pericholecystic fluid. There is an approximately 3 mm T2 hyperintense lesion laterally in the right hepatic lobe on image 20/5 without appreciable enhancement, probably a tiny cyst but technically too small to characterize. No worrisome abnormal enhancement in the liver. Pancreas: Currently no significant dorsal pancreatic duct dilatation. No abnormal pancreatic enhancement. Spleen:  Unremarkable Adrenals/Urinary Tract: Right renal atrophy and scarring. Adrenal glands normal. Stomach/Bowel: On the coronal images, some of the pelvic loops of thickened distal small bowel are identified, likely to include the terminal ileum. Some of these loops are borderline dilated. Vascular/Lymphatic:  Unremarkable Other: Trace perihepatic ascites posteriorly on images 24-27 of series 13. Musculoskeletal: Unremarkable IMPRESSION: 1. Cholelithiasis with dilated common bile duct up to 9 mm in diameter, without current dilatation of the dorsal pancreatic duct. Note that the patient has previously also had dilated common bile duct, up to 8 mm in diameter on 12/04/2016. No definite stone is identified in the common bile duct, with understanding that there is some degradation of negative predictive value due to motion artifact. Acute cholecystitis is not excluded. 2. Trace perihepatic ascites adjacent to the right hepatic lobe inferiorly. 3. No abnormal enhancement along the biliary tree to suggest cholangitis. 4. Thickened distal small bowel loops, including the terminal ileum, potentially from infectious enteritis or inflammatory bowel disease. 5. Right renal scarring and resulting atrophy. Electronically Signed   By: Van Clines M.D.   On: 05/10/2018 09:33   US Abdomen Limited Ruq  Result Date: 05/09/2018 CLINICAL DATA:  Right upper quadrant  abdominal pain for  1 week with nausea, vomiting and diarrhea. EXAM: ULTRASOUND ABDOMEN LIMITED RIGHT UPPER QUADRANT COMPARISON:  CT 05/08/2018. FINDINGS: Gallbladder: Multiple gallstones are again noted. The gallbladder is better distended and demonstrates no wall thickening. However, the patient is reported to have a sonographic Murphy's sign by the sonographer. Common bile duct: Diameter: 7 mm.  Less dilated than yesterday's CT. Liver: No focal lesion identified. Within normal limits in parenchymal echogenicity. Portal vein is patent on color Doppler imaging with normal direction of blood flow towards the liver. IMPRESSION: 1. Cholelithiasis without objective signs of cholecystitis. The patient is reported to have a sonographic Murphy sign, however. 2. Mild biliary dilatation, improved from recent CT. Electronically Signed   By: Richardean Sale M.D.   On: 05/09/2018 10:17    Anti-infectives: Anti-infectives (From admission, onward)   Start     Dose/Rate Route Frequency Ordered Stop   05/09/18 1600  meropenem (MERREM) 1 g in sodium chloride 0.9 % 100 mL IVPB     1 g 200 mL/hr over 30 Minutes Intravenous Every 8 hours 05/09/18 1523        Assessment/Plan: [name] is a 47 yo with acute cholecystitis. MRCP without stone.   -OR for lap chole tomorrow  -EKG and CXR ordered -Cipro ordered preop on meropenem now   PLAN: I counseled the patient about the indication, risks and benefits of laparoscopic cholecystectomy.  She understands there is a very small chance for bleeding, infection, injury to normal structures (including common bile duct), conversion to open surgery, persistent symptoms, evolution of postcholecystectomy diarrhea, need for secondary interventions, anesthesia reaction, cardiopulmonary issues and other risks not specifically detailed here. I described the expected recovery, the plan for follow-up and the restrictions during the recovery phase.  All questions were answered.    LOS:  1 day    Virl Cagey 05/10/2018

## 2018-05-10 NOTE — Progress Notes (Addendum)
PROGRESS NOTE  [name] TZG:[id number] DOB: [birth year] DOA: 05/09/2018 PCP: Patient, No Pcp Per  Brief History:  47 y.o. female with medical history of chronic pain syndrome, tobacco abuse, fibromyalgia presenting with 1 week history of generalized abdominal pain with associated nausea and vomiting.  The patient complained of low-grade temperature up to 100.0 F at home.  She denies any recent sick contacts.  The patient also developed loose stools, but this has resolved within the last 48 hours.  She denies any hematochezia, melena, hematemesis.   The patient visited the emergency department on 05/07/2018.  She was treated symptomatically and released home.  She returned on 06/04/2018.  CT of the abdomen and pelvis at that time showed mild intrahepatic ductal dilatation with common bile duct of 10 mm.  There was also gallbladder wall thickening with a small amount of pericholecystic fluid.  There was also bowel wall thickening involving the jejunum to the terminal ileum.  The patient was offered admission, but because of childcare issues, the patient did not want to stay in the hospital.  She returned the morning of 05/09/2018 because of continued vomiting and abdominal pain.  WBC was noted to be 12.9.  BMP was essentially unremarkable with potassium 3.4.  Abdominal ultrasound was obtained and showed cholelithiasis with positive Murphy sign but without any gallbladder wall thickening.  Common bile duct was noted to be 7 mm. General surgery was consulted to assist.    Assessment/Plan: Symptomatic cholelithiasis/cholecystitis -Concerned about choledocholithiasis -MRCP--no choledocholithiasis, CBD 42mm -Start meropenem due to penicillin allergy -IV fluids -Pain control -Clear liquid diet, n.p.o. after midnight -General surgery consult appreciated  Chronic pain syndrome -The patient gives a history that is inconsistent with what is documented in the [place] opioid database -she states she is  no longer taking suboxone, but PMP Aware shows that suboxone was filled on 05/07/18, 04/26/18, 04/19/18, and 04/07/18 -nevertheless, judicious pain controlled will be required  SB wall thickening -enteritis vs reactive due to process in GB -not clear if this is contributing to patient's pain -consult GI -diarrhea has resolved -monitor clinically  Hypokalemia -replete -check mag--1.8  Tobacco abuse -nicoderm patch deferred -I have discussed tobacco cessation with the patient.  I have counseled the patient regarding the negative impacts of continued tobacco use including but not limited to lung cancer, COPD, and cardiovascular disease.  I have discussed alternatives to tobacco and modalities that may help facilitate tobacco cessation including but not limited to biofeedback, hypnosis, and medications.  Total time spent with tobacco counseling was 4 minutes.     Disposition Plan:   Home 2-3 days Family Communication:   No Family at bedside  Consultants:  General surgery  Code Status:  FULL  DVT Prophylaxis:  Maries Heparin    Procedures: As Listed in Progress Note Above  Antibiotics: merrem 8/4>>>    Subjective: Pt c/o of abdominal pain, but states vomiting and diarrhea are improving.  Denies cp, sob, f/c, hematochezia, melena.  Objective: Vitals:   05/09/18 2123 05/09/18 2125 05/10/18 0533 05/10/18 1358  BP:  (!) 144/71 (!) 147/89 (!) 158/97  Pulse: 78 67 63 86  Resp: 19 19 18 18   Temp: 98.2 F (36.8 C) 98.3 F (36.8 C) 99.3 F (37.4 C) 99.7 F (37.6 C)  TempSrc: Oral  Oral Oral  SpO2: 99% 98% 98% 97%  Weight:      Height:        Intake/Output Summary (Last  24 hours) at 05/10/2018 1658 Last data filed at 05/10/2018 1200 Gross per 24 hour  Intake 1760 ml  Output 1000 ml  Net 760 ml   Weight change:  Exam:   General:  Pt is alert, follows commands appropriately, not in acute distress  HEENT: No icterus, No thrush, No neck mass, /AT  Cardiovascular: RRR,  S1/S2, no rubs, no gallops  Respiratory: CTA bilaterally, no wheezing, no crackles, no rhonchi  Abdomen: Soft/+BS, generalized tender, non distended, no guarding  Extremities: No edema, No lymphangitis, No petechiae, No rashes, no synovitis   Data Reviewed: I have personally reviewed following labs and imaging studies Basic Metabolic Panel: Recent Labs  Lab 05/07/18 1045 05/08/18 1628 05/09/18 0945 05/10/18 0410  NA 139 138 137 137  K 4.1 3.4* 3.4* 3.5  CL 107 105 106 107  CO2 27 27 25 25   GLUCOSE 128* 136* 124* 80  BUN 10 7 7  5*  CREATININE 0.81 0.75 0.68 0.65  CALCIUM 8.4* 8.8* 8.5* 8.2*  MG  --   --   --  1.8   Liver Function Tests: Recent Labs  Lab 05/07/18 1045 05/08/18 1628 05/09/18 0945 05/10/18 0410  AST 12* 11* 10* 9*  ALT 11 10 11 8   ALKPHOS 66 64 61 60  BILITOT 1.0 1.1 0.9 0.8  PROT 6.3* 6.6 6.6 6.1*  ALBUMIN 3.4* 3.5 3.6 3.3*   Recent Labs  Lab 05/07/18 1045 05/08/18 1628 05/09/18 0945  LIPASE 33 36 33   No results for input(s): AMMONIA in the last 168 hours. Coagulation Profile: No results for input(s): INR, PROTIME in the last 168 hours. CBC: Recent Labs  Lab 05/07/18 1045 05/08/18 1628 05/09/18 0945 05/10/18 0410  WBC 11.4* 8.4 12.9* 12.3*  NEUTROABS 9.3* 6.0 10.8*  --   HGB 14.6 14.0 13.9 13.1  HCT 43.7 42.2 41.3 40.8  MCV 90.3 90.2 90.0 91.7  PLT 186 178 192 174   Cardiac Enzymes: No results for input(s): CKTOTAL, CKMB, CKMBINDEX, TROPONINI in the last 168 hours. BNP: Invalid input(s): POCBNP CBG: No results for input(s): GLUCAP in the last 168 hours. HbA1C: No results for input(s): HGBA1C in the last 72 hours. Urine analysis:    Component Value Date/Time   COLORURINE YELLOW 05/08/2018 1630   APPEARANCEUR CLEAR 05/08/2018 1630   LABSPEC 1.016 05/08/2018 1630   PHURINE 6.0 05/08/2018 1630   GLUCOSEU NEGATIVE 05/08/2018 1630   HGBUR NEGATIVE 05/08/2018 1630   BILIRUBINUR NEGATIVE 05/08/2018 1630   KETONESUR 5 (A)  05/08/2018 1630   PROTEINUR NEGATIVE 05/08/2018 1630   UROBILINOGEN 0.2 06/27/2015 1614   NITRITE NEGATIVE 05/08/2018 1630   LEUKOCYTESUR NEGATIVE 05/08/2018 1630   Sepsis Labs: @LABRCNTIP (procalcitonin:4,lacticidven:4) )No results found for this or any previous visit (from the past 240 hour(s)).   Scheduled Meds: . heparin  5,000 Units Subcutaneous Q8H   Continuous Infusions: . 0.9 % NaCl with KCl 20 mEq / L 100 mL/hr at 05/09/18 1612  . meropenem (MERREM) IV Stopped (05/10/18 1635)    Procedures/Studies: Ct Abdomen Pelvis W Contrast  Result Date: 05/08/2018 CLINICAL DATA:  Per ED notes: Patient c/o upper abd pain/spasms x2 days. Patient states nausea, vomiting, diarrhea, and intermittent fevers. Denies any urinary symptoms EXAM: CT ABDOMEN AND PELVIS WITH CONTRAST TECHNIQUE: Multidetector CT imaging of the abdomen and pelvis was performed using the standard protocol following bolus administration of intravenous contrast. CONTRAST:  122mL ISOVUE-300 IOPAMIDOL (ISOVUE-300) INJECTION 61% COMPARISON:  CT of the abdomen and pelvis on 03/16/2018 FINDINGS: Lower chest:  No pulmonary nodules, pleural effusions, or infiltrates. Heart size is normal. Hepatobiliary: No pericardial effusion or significant coronary artery calcifications. The there is mild intrahepatic biliary duct dilatation. Common bile duct is 10 millimeters. Gallbladder appears contracted. Gallbladder wall is thickened or there is small amount of pericholecystic fluid. Pancreas: There is increased, mild dilatation of the pancreatic duct. Spleen: Normal in size without focal abnormality. Adrenals/Urinary Tract: Adrenal glands are normal in appearance. The RIGHT kidney is diminutive and there are linear areas of scarring throughout the kidney. No hydronephrosis. The LEFT kidney is normal in appearance. No suspicious renal mass. The urinary bladder is decompressed. Stomach/Bowel: The stomach is normal in appearance. There is bowel wall  thickening and inflammation involving the majority of the small bowel, beginning with the jejunum and extending to the terminal ileum. The findings are diffuse and symmetric. There are small collections of mesenteric fluid. The colon is unremarkable. There is a small amount of fluid surrounding the appendix but this appears to be a secondary process. The appendix is normal in diameter. Vascular/Lymphatic: No significant vascular findings are present. No enlarged abdominal or pelvic lymph nodes. Reproductive: Uterus is present.  No adnexal mass. Other: There is a small amount of free pelvic fluid, increased since prior study. Musculoskeletal: No acute or significant osseous findings. IMPRESSION: 1. Significant small bowel abnormality without change in distribution. Findings are consistent with enteritis of the mid and distal small bowel. No evidence for perforation or abscess. 2. Increased ascites. 3. Increased intrahepatic and pancreatic duct dilatation. 4. Question of gallbladder wall thickening versus pericholecystic fluid. Consider further evaluation with RIGHT UPPER QUADRANT ultrasound. Electronically Signed   By: Nolon Nations M.D.   On: 05/08/2018 18:01   Mr 3d Recon At Scanner  Result Date: 05/10/2018 CLINICAL DATA:  Abdominal pain with swelling and nausea. Intrahepatic and pancreatic duct dilatation with cholelithiasis along with small bowel wall thickening. EXAM: MRI ABDOMEN WITHOUT AND WITH CONTRAST (INCLUDING MRCP) TECHNIQUE: Multiplanar multisequence MR imaging of the abdomen was performed both before and after the administration of intravenous contrast. Heavily T2-weighted images of the biliary and pancreatic ducts were obtained, and three-dimensional MRCP images were rendered by post processing. CONTRAST:  60mL MULTIHANCE GADOBENATE DIMEGLUMINE 529 MG/ML IV SOLN COMPARISON:  Multiple exams, including 05/08/2018 CT scan and ultrasound from 05/09/2018 FINDINGS: Despite efforts by the technologist  and patient, motion artifact is present on today's exam and could not be eliminated. This reduces exam sensitivity and specificity. Lower chest: Unremarkable Hepatobiliary: Multiple filling defects in the gallbladder are present measuring up to 0.9 cm in long axis. Some of these are non dependent. There is dilatation of the common bile duct at 0.9 cm with distal conical tapering and without appreciable distal filling defect in the common bile duct, subject to the limitations of motion artifact. Minimal intrahepatic biliary dilatation. No irregularity of the biliary system or definite abnormal enhancement along the biliary walls. No pericholecystic fluid. There is an approximately 3 mm T2 hyperintense lesion laterally in the right hepatic lobe on image 20/5 without appreciable enhancement, probably a tiny cyst but technically too small to characterize. No worrisome abnormal enhancement in the liver. Pancreas: Currently no significant dorsal pancreatic duct dilatation. No abnormal pancreatic enhancement. Spleen:  Unremarkable Adrenals/Urinary Tract: Right renal atrophy and scarring. Adrenal glands normal. Stomach/Bowel: On the coronal images, some of the pelvic loops of thickened distal small bowel are identified, likely to include the terminal ileum. Some of these loops are borderline dilated. Vascular/Lymphatic:  Unremarkable Other: Trace  perihepatic ascites posteriorly on images 24-27 of series 13. Musculoskeletal: Unremarkable IMPRESSION: 1. Cholelithiasis with dilated common bile duct up to 9 mm in diameter, without current dilatation of the dorsal pancreatic duct. Note that the patient has previously also had dilated common bile duct, up to 8 mm in diameter on 12/04/2016. No definite stone is identified in the common bile duct, with understanding that there is some degradation of negative predictive value due to motion artifact. Acute cholecystitis is not excluded. 2. Trace perihepatic ascites adjacent to the  right hepatic lobe inferiorly. 3. No abnormal enhancement along the biliary tree to suggest cholangitis. 4. Thickened distal small bowel loops, including the terminal ileum, potentially from infectious enteritis or inflammatory bowel disease. 5. Right renal scarring and resulting atrophy. Electronically Signed   By: Van Clines M.D.   On: 05/10/2018 09:33   Mr Abdomen Mrcp Moise Boring Contast  Result Date: 05/10/2018 CLINICAL DATA:  Abdominal pain with swelling and nausea. Intrahepatic and pancreatic duct dilatation with cholelithiasis along with small bowel wall thickening. EXAM: MRI ABDOMEN WITHOUT AND WITH CONTRAST (INCLUDING MRCP) TECHNIQUE: Multiplanar multisequence MR imaging of the abdomen was performed both before and after the administration of intravenous contrast. Heavily T2-weighted images of the biliary and pancreatic ducts were obtained, and three-dimensional MRCP images were rendered by post processing. CONTRAST:  75mL MULTIHANCE GADOBENATE DIMEGLUMINE 529 MG/ML IV SOLN COMPARISON:  Multiple exams, including 05/08/2018 CT scan and ultrasound from 05/09/2018 FINDINGS: Despite efforts by the technologist and patient, motion artifact is present on today's exam and could not be eliminated. This reduces exam sensitivity and specificity. Lower chest: Unremarkable Hepatobiliary: Multiple filling defects in the gallbladder are present measuring up to 0.9 cm in long axis. Some of these are non dependent. There is dilatation of the common bile duct at 0.9 cm with distal conical tapering and without appreciable distal filling defect in the common bile duct, subject to the limitations of motion artifact. Minimal intrahepatic biliary dilatation. No irregularity of the biliary system or definite abnormal enhancement along the biliary walls. No pericholecystic fluid. There is an approximately 3 mm T2 hyperintense lesion laterally in the right hepatic lobe on image 20/5 without appreciable enhancement, probably a  tiny cyst but technically too small to characterize. No worrisome abnormal enhancement in the liver. Pancreas: Currently no significant dorsal pancreatic duct dilatation. No abnormal pancreatic enhancement. Spleen:  Unremarkable Adrenals/Urinary Tract: Right renal atrophy and scarring. Adrenal glands normal. Stomach/Bowel: On the coronal images, some of the pelvic loops of thickened distal small bowel are identified, likely to include the terminal ileum. Some of these loops are borderline dilated. Vascular/Lymphatic:  Unremarkable Other: Trace perihepatic ascites posteriorly on images 24-27 of series 13. Musculoskeletal: Unremarkable IMPRESSION: 1. Cholelithiasis with dilated common bile duct up to 9 mm in diameter, without current dilatation of the dorsal pancreatic duct. Note that the patient has previously also had dilated common bile duct, up to 8 mm in diameter on 12/04/2016. No definite stone is identified in the common bile duct, with understanding that there is some degradation of negative predictive value due to motion artifact. Acute cholecystitis is not excluded. 2. Trace perihepatic ascites adjacent to the right hepatic lobe inferiorly. 3. No abnormal enhancement along the biliary tree to suggest cholangitis. 4. Thickened distal small bowel loops, including the terminal ileum, potentially from infectious enteritis or inflammatory bowel disease. 5. Right renal scarring and resulting atrophy. Electronically Signed   By: Van Clines M.D.   On: 05/10/2018 09:33  US Abdomen Limited Ruq  Result Date: 05/09/2018 CLINICAL DATA:  Right upper quadrant abdominal pain for 1 week with nausea, vomiting and diarrhea. EXAM: ULTRASOUND ABDOMEN LIMITED RIGHT UPPER QUADRANT COMPARISON:  CT 05/08/2018. FINDINGS: Gallbladder: Multiple gallstones are again noted. The gallbladder is better distended and demonstrates no wall thickening. However, the patient is reported to have a sonographic Murphy's sign by the  sonographer. Common bile duct: Diameter: 7 mm.  Less dilated than yesterday's CT. Liver: No focal lesion identified. Within normal limits in parenchymal echogenicity. Portal vein is patent on color Doppler imaging with normal direction of blood flow towards the liver. IMPRESSION: 1. Cholelithiasis without objective signs of cholecystitis. The patient is reported to have a sonographic Murphy sign, however. 2. Mild biliary dilatation, improved from recent CT. Electronically Signed   By: Richardean Sale M.D.   On: 05/09/2018 10:17    Orson Eva, DO  [facility] Pager [phone]  If 7PM-7AM, please contact night-coverage [email or web address] Password [email or web address] 05/10/2018, 4:58 PM   LOS: 1 day

## 2018-05-10 NOTE — Progress Notes (Signed)
Patient blood pressure slightly elevated but patient states that she is in pain and that is why. Pain medication was administered right before vital signs were taken. Will continue to monitor.

## 2018-05-11 ENCOUNTER — Encounter (HOSPITAL_COMMUNITY): Payer: Self-pay | Admitting: Gastroenterology

## 2018-05-11 ENCOUNTER — Encounter (HOSPITAL_COMMUNITY)
Admission: EM | Disposition: A | Discharge status: Home / Self Care | Payer: Self-pay | Source: Home / Self Care | Attending: Internal Medicine

## 2018-05-11 ENCOUNTER — Inpatient Hospital Stay (HOSPITAL_COMMUNITY): Payer: Medicare Other | Admitting: Anesthesiology

## 2018-05-11 DIAGNOSIS — K529 Noninfective gastroenteritis and colitis, unspecified: Secondary | ICD-10-CM

## 2018-05-11 HISTORY — PX: CHOLECYSTECTOMY: SHX55

## 2018-05-11 LAB — COMPREHENSIVE METABOLIC PANEL
ALBUMIN: 3.3 g/dL — AB (ref 3.5–5.0)
ALT: 9 U/L (ref 0–44)
AST: 8 U/L — AB (ref 15–41)
Alkaline Phosphatase: 66 U/L (ref 38–126)
Anion gap: 6 (ref 5–15)
BILIRUBIN TOTAL: 0.8 mg/dL (ref 0.3–1.2)
CALCIUM: 8.4 mg/dL — AB (ref 8.9–10.3)
CO2: 28 mmol/L (ref 22–32)
CREATININE: 0.71 mg/dL (ref 0.44–1.00)
Chloride: 102 mmol/L (ref 98–111)
Glucose, Bld: 82 mg/dL (ref 70–99)
Potassium: 3.5 mmol/L (ref 3.5–5.1)
Sodium: 136 mmol/L (ref 135–145)
TOTAL PROTEIN: 6.5 g/dL (ref 6.5–8.1)

## 2018-05-11 LAB — CBC
HCT: 42.8 % (ref 36.0–46.0)
Hemoglobin: 14.1 g/dL (ref 12.0–15.0)
MCH: 29.9 pg (ref 26.0–34.0)
MCHC: 32.9 g/dL (ref 30.0–36.0)
MCV: 90.9 fL (ref 78.0–100.0)
PLATELETS: 189 10*3/uL (ref 150–400)
RBC: 4.71 MIL/uL (ref 3.87–5.11)
RDW: 12.4 % (ref 11.5–15.5)
WBC: 14.6 10*3/uL — AB (ref 4.0–10.5)

## 2018-05-11 SURGERY — LAPAROSCOPIC CHOLECYSTECTOMY
Anesthesia: General | Site: Abdomen

## 2018-05-11 MED ORDER — FENTANYL CITRATE (PF) 100 MCG/2ML IJ SOLN
INTRAMUSCULAR | Status: DC | PRN
  Administered 2018-05-11 (×3): 50 ug via INTRAVENOUS
  Administered 2018-05-11: 100 ug via INTRAVENOUS

## 2018-05-11 MED ORDER — PROPOFOL 10 MG/ML IV BOLUS
INTRAVENOUS | Status: AC
  Filled 2018-05-11: qty 20

## 2018-05-11 MED ORDER — SUGAMMADEX SODIUM 200 MG/2ML IV SOLN
INTRAVENOUS | Status: DC | PRN
  Administered 2018-05-11: 500 mg via INTRAVENOUS

## 2018-05-11 MED ORDER — HYDROMORPHONE HCL 1 MG/ML IJ SOLN: 0.5000 mg | INTRAMUSCULAR | Status: DC | PRN

## 2018-05-11 MED ORDER — SUGAMMADEX SODIUM 500 MG/5ML IV SOLN
INTRAVENOUS | Status: AC
Start: 2018-05-11 — End: ?
  Filled 2018-05-11: qty 5

## 2018-05-11 MED ORDER — BUPIVACAINE HCL (PF) 0.5 % IJ SOLN
INTRAMUSCULAR | Status: DC | PRN
  Administered 2018-05-11: 10 mL

## 2018-05-11 MED ORDER — PROPOFOL 10 MG/ML IV BOLUS
INTRAVENOUS | Status: DC | PRN
  Administered 2018-05-11: 150 mg via INTRAVENOUS

## 2018-05-11 MED ORDER — ONDANSETRON HCL 4 MG/2ML IJ SOLN
INTRAMUSCULAR | Status: DC | PRN
  Administered 2018-05-11: 4 mg via INTRAVENOUS

## 2018-05-11 MED ORDER — OXYCODONE HCL 5 MG PO TABS
5.0000 mg | ORAL_TABLET | ORAL | Status: DC | PRN
  Administered 2018-05-11 – 2018-05-12 (×4): 10 mg via ORAL
  Filled 2018-05-11: qty 1
  Filled 2018-05-11: qty 2
  Filled 2018-05-11: qty 1
  Filled 2018-05-11 (×2): qty 2

## 2018-05-11 MED ORDER — BUPIVACAINE HCL (PF) 0.5 % IJ SOLN
INTRAMUSCULAR | Status: AC
  Filled 2018-05-11: qty 30

## 2018-05-11 MED ORDER — SODIUM CHLORIDE 0.9 % IR SOLN
Status: DC | PRN
  Administered 2018-05-11: 1000 mL

## 2018-05-11 MED ORDER — ROCURONIUM BROMIDE 50 MG/5ML IV SOLN
INTRAVENOUS | Status: AC
  Filled 2018-05-11: qty 1

## 2018-05-11 MED ORDER — FENTANYL CITRATE (PF) 100 MCG/2ML IJ SOLN
25.0000 ug | INTRAMUSCULAR | Status: DC | PRN
  Administered 2018-05-11 (×4): 50 ug via INTRAVENOUS
  Filled 2018-05-11 (×2): qty 2

## 2018-05-11 MED ORDER — FENTANYL CITRATE (PF) 250 MCG/5ML IJ SOLN
INTRAMUSCULAR | Status: AC
  Filled 2018-05-11: qty 5

## 2018-05-11 MED ORDER — CYCLOBENZAPRINE HCL 10 MG PO TABS
5.0000 mg | ORAL_TABLET | Freq: Three times a day (TID) | ORAL | Status: DC | PRN
  Administered 2018-05-11 – 2018-05-12 (×2): 5 mg via ORAL
  Filled 2018-05-11 (×2): qty 1

## 2018-05-11 MED ORDER — MIDAZOLAM HCL 5 MG/5ML IJ SOLN
INTRAMUSCULAR | Status: DC | PRN
  Administered 2018-05-11: 2 mg via INTRAVENOUS

## 2018-05-11 MED ORDER — ONDANSETRON HCL 4 MG/2ML IJ SOLN
INTRAMUSCULAR | Status: AC
  Filled 2018-05-11: qty 2

## 2018-05-11 MED ORDER — OXYCODONE HCL 5 MG PO TABS
5.0000 mg | ORAL_TABLET | ORAL | Status: DC | PRN
  Administered 2018-05-11: 5 mg via ORAL
  Filled 2018-05-11 (×2): qty 1

## 2018-05-11 MED ORDER — LACTATED RINGERS IV SOLN
INTRAVENOUS | Status: DC
  Administered 2018-05-11: 12:00:00 via INTRAVENOUS

## 2018-05-11 MED ORDER — HEMOSTATIC AGENTS (NO CHARGE) OPTIME
TOPICAL | Status: DC | PRN
  Administered 2018-05-11: 1 via TOPICAL

## 2018-05-11 MED ORDER — MIDAZOLAM HCL 2 MG/2ML IJ SOLN
INTRAMUSCULAR | Status: AC
  Filled 2018-05-11: qty 2

## 2018-05-11 MED ORDER — ROCURONIUM BROMIDE 100 MG/10ML IV SOLN
INTRAVENOUS | Status: DC | PRN
  Administered 2018-05-11: 40 mg via INTRAVENOUS

## 2018-05-11 SURGICAL SUPPLY — 48 items
ADH SKN CLS APL DERMABOND .7 (GAUZE/BANDAGES/DRESSINGS) ×1
APPLIER CLIP ROT 10 11.4 M/L (STAPLE) ×3
APR CLP MED LRG 11.4X10 (STAPLE) ×1
BAG RETRIEVAL 10 (BASKET) ×1
BAG RETRIEVAL 10MM (BASKET) ×1
BLADE SURG 15 STRL LF DISP TIS (BLADE) ×1 IMPLANT
BLADE SURG 15 STRL SS (BLADE) ×3
CHLORAPREP W/TINT 26ML (MISCELLANEOUS) ×3 IMPLANT
CLIP APPLIE ROT 10 11.4 M/L (STAPLE) ×1 IMPLANT
CLOTH BEACON ORANGE TIMEOUT ST (SAFETY) ×3 IMPLANT
COVER LIGHT HANDLE STERIS (MISCELLANEOUS) ×6 IMPLANT
DECANTER SPIKE VIAL GLASS SM (MISCELLANEOUS) ×3 IMPLANT
DERMABOND ADVANCED (GAUZE/BANDAGES/DRESSINGS) ×2
DERMABOND ADVANCED .7 DNX12 (GAUZE/BANDAGES/DRESSINGS) ×1 IMPLANT
ELECT REM PT RETURN 9FT ADLT (ELECTROSURGICAL) ×3
ELECTRODE REM PT RTRN 9FT ADLT (ELECTROSURGICAL) ×1 IMPLANT
FILTER SMOKE EVAC LAPAROSHD (FILTER) ×3 IMPLANT
GLOVE BIOGEL PI IND STRL 6.5 (GLOVE) ×1 IMPLANT
GLOVE BIOGEL PI IND STRL 7.0 (GLOVE) ×3 IMPLANT
GLOVE BIOGEL PI INDICATOR 6.5 (GLOVE) ×2
GLOVE BIOGEL PI INDICATOR 7.0 (GLOVE) ×6
GLOVE SURG SS PI 6.5 STRL IVOR (GLOVE) ×2 IMPLANT
GLOVE SURG SS PI 7.0 STRL IVOR (GLOVE) ×4 IMPLANT
GOWN STRL REUS W/TWL LRG LVL3 (GOWN DISPOSABLE) ×9 IMPLANT
HEMOSTAT SNOW SURGICEL 2X4 (HEMOSTASIS) ×3 IMPLANT
INST SET LAPROSCOPIC AP (KITS) ×3 IMPLANT
IV NS IRRIG 3000ML ARTHROMATIC (IV SOLUTION) IMPLANT
KIT TURNOVER KIT A (KITS) ×3 IMPLANT
MANIFOLD NEPTUNE II (INSTRUMENTS) ×3 IMPLANT
NDL INSUFFLATION 14GA 120MM (NEEDLE) ×1 IMPLANT
NEEDLE INSUFFLATION 14GA 120MM (NEEDLE) ×3 IMPLANT
NS IRRIG 1000ML POUR BTL (IV SOLUTION) ×3 IMPLANT
PACK LAP CHOLE LZT030E (CUSTOM PROCEDURE TRAY) ×3 IMPLANT
PAD ARMBOARD 7.5X6 YLW CONV (MISCELLANEOUS) ×3 IMPLANT
SET BASIN LINEN APH (SET/KITS/TRAYS/PACK) ×3 IMPLANT
SET TUBE IRRIG SUCTION NO TIP (IRRIGATION / IRRIGATOR) IMPLANT
SLEEVE ENDOPATH XCEL 5M (ENDOMECHANICALS) ×3 IMPLANT
SUT MNCRL AB 4-0 PS2 18 (SUTURE) ×3 IMPLANT
SUT VICRYL 0 UR6 27IN ABS (SUTURE) ×3 IMPLANT
SYS BAG RETRIEVAL 10MM (BASKET) ×1
SYSTEM BAG RETRIEVAL 10MM (BASKET) ×1 IMPLANT
TROCAR ENDO BLADELESS 11MM (ENDOMECHANICALS) ×3 IMPLANT
TROCAR XCEL NON-BLD 5MMX100MML (ENDOMECHANICALS) ×3 IMPLANT
TROCAR XCEL UNIV SLVE 11M 100M (ENDOMECHANICALS) ×3 IMPLANT
TUBE CONNECTING 12'X1/4 (SUCTIONS) ×1
TUBE CONNECTING 12X1/4 (SUCTIONS) ×2 IMPLANT
TUBING INSUFFLATION (TUBING) ×3 IMPLANT
WARMER LAPAROSCOPE (MISCELLANEOUS) ×3 IMPLANT

## 2018-05-11 NOTE — Anesthesia Postprocedure Evaluation (Signed)
Anesthesia Post Note  Patient: Hiliary Osorto Arseneault  Procedure(s) Performed: LAPAROSCOPIC CHOLECYSTECTOMY (N/A Abdomen)  Patient location during evaluation: PACU Anesthesia Type: General Level of consciousness: awake and alert and patient cooperative Pain management: satisfactory to patient Vital Signs Assessment: post-procedure vital signs reviewed and stable Respiratory status: spontaneous breathing Cardiovascular status: stable Postop Assessment: no apparent nausea or vomiting Anesthetic complications: no     Last Vitals:  Vitals:   05/11/18 1330 05/11/18 1345  BP:  131/77  Pulse: 72 70  Resp: 13 18  Temp:    SpO2: 100% 96%    Last Pain:  Vitals:   05/11/18 1355  TempSrc:   PainSc: Asleep                 Haeven Nickle

## 2018-05-11 NOTE — Op Note (Addendum)
Operative Note   Preoperative Diagnosis: Acute cholecystitis, enteritis    Postoperative Diagnosis: Same   Procedure(s) Performed: Laparoscopic cholecystectomy   Surgeon: Ria Comment C. Constance Haw, MD   Assistants: No qualified resident was available   Anesthesia: General endotracheal   Anesthesiologist: Lenice Llamas, MD    Specimens: Gallbladder    Estimated Blood Loss: Minimal    Blood Replacement: None    Complications: None    Operative Findings:  Distended gallbladder with sludge, small bowel appeared injected/ irritated but otherwise normal    Procedure: The patient was taken to the operating room and placed supine. General endotracheal anesthesia was induced. Intravenous antibiotics were administered per protocol. An orogastric tube positioned to decompress the stomach. The abdomen was prepared and draped in the usual sterile fashion.    A supraumbilical incision was made and a Veress technique was utilized to achieve pneumoperitoneum to 15 mmHg with carbon dioxide. A 11 mm optiview port was placed through the supraumbilical region, and a 10 mm 0-degree operative laparoscope was introduced. The area underlying the trocar and Veress needle were inspected and without evidence of injury.  Remaining trocars were placed under direct vision. Two 5 mm ports were placed in the right abdomen, between the anterior axillary and midclavicular line.  A final 11 mm port was placed through the mid-epigastrium, near the falciform ligament.  On inspection the small bowel in the right lower quadrant appeared a little irritated/ injected but had peristalsis and was normal caliber.    The gallbladder fundus was elevated cephalad and the infundibulum was retracted to the patient's right. The gallbladder/cystic duct junction was skeletonized. The cystic artery noted in the triangle of Calot and was also skeletonized.  We then continued liberal medial and lateral dissection until the critical view of  safety was achieved.    The cystic duct and cystic artery were doubly clipped and divided. The gallbladder was then dissected from the liver bed with electrocautery. The specimen was placed in an Endopouch and was retrieved through the epigastric site.   Final inspection revealed acceptable hemostasis. Surgical SNOW  was placed in the gallbladder bed. The epigastric and umbilical port sites were smaller than my finger and were not closed. Trocars were removed and pneumoperitoneum was released. Skin incisions were closed with 4-0 Monocryl subcuticular sutures and Dermabond. The patient was awakened from anesthesia and extubated without complication.    Curlene Labrum, MD [facility] [address] [place], [place] [address] [phone] (office)

## 2018-05-11 NOTE — Discharge Instructions (Signed)
Discharge Instructions: Shower per your regular routine. Take tylenol and ibuprofen as needed for pain control, alternating every 4-6 hours.  Take narcotic for breakthrough pain. Take colace for constipation related to narcotic pain medication. Do not pick at the dermabond glue on your incision sites.   Laparoscopic Cholecystectomy, Care After This sheet gives you information about how to care for yourself after your procedure. Your doctor may also give you more specific instructions. If you have problems or questions, contact your doctor. Follow these instructions at home: Care for cuts from surgery (incisions)   Follow instructions from your doctor about how to take care of your cuts from surgery. Make sure you: ? Wash your hands with soap and water before you change your bandage (dressing). If you cannot use soap and water, use hand sanitizer. ? Change your bandage as told by your doctor. ? Leave stitches (sutures), skin glue, or skin tape (adhesive) strips in place. They may need to stay in place for 2 weeks or longer. If tape strips get loose and curl up, you may trim the loose edges. Do not remove tape strips completely unless your doctor says it is okay.  Do not take baths, swim, or use a hot tub until your doctor says it is okay. Ask your doctor if you can take showers. You may only be allowed to take sponge baths for bathing.  Check your surgical cut area every day for signs of infection. Check for: ? More redness, swelling, or pain. ? More fluid or blood. ? Warmth. ? Pus or a bad smell. Activity  Do not drive or use heavy machinery while taking prescription pain medicine.  Do not lift anything that is heavier than 10 lb (4.5 kg) until your doctor says it is okay.  Do not play contact sports until your doctor says it is okay.  Do not drive for 24 hours if you were given a medicine to help you relax (sedative).  Rest as needed. Do not return to work or school until your  doctor says it is okay. General instructions  Take over-the-counter and prescription medicines only as told by your doctor.  To prevent or treat constipation while you are taking prescription pain medicine, your doctor may recommend that you: ? Drink enough fluid to keep your pee (urine) clear or pale yellow. ? Take over-the-counter or prescription medicines. ? Eat foods that are high in fiber, such as fresh fruits and vegetables, whole grains, and beans. ? Limit foods that are high in fat and processed sugars, such as fried and sweet foods. Contact a doctor if:  You develop a rash.  You have more redness, swelling, or pain around your surgical cuts.  You have more fluid or blood coming from your surgical cuts.  Your surgical cuts feel warm to the touch.  You have pus or a bad smell coming from your surgical cuts.  You have a fever.  One or more of your surgical cuts breaks open. Get help right away if:  You have trouble breathing.  You have chest pain.  You have pain that is getting worse in your shoulders.  You faint or feel dizzy when you stand.  You have very bad pain in your belly (abdomen).  You are sick to your stomach (nauseous) for more than one day.  You have throwing up (vomiting) that lasts for more than one day.  You have leg pain. This information is not intended to replace advice given to you by your  health care provider. Make sure you discuss any questions you have with your health care provider. Document Released: 07/01/2008 Document Revised: 04/12/2016 Document Reviewed: 03/10/2016 Elsevier Interactive Patient Education  2018 [employer].

## 2018-05-11 NOTE — Transfer of Care (Addendum)
Immediate Anesthesia Transfer of Care Note  Patient: [name]  Procedure(s) Performed: LAPAROSCOPIC CHOLECYSTECTOMY (N/A Abdomen)  Patient Location: PACU  Anesthesia Type:General  Level of Consciousness: awake and patient cooperative  Airway & Oxygen Therapy: Patient Spontanous Breathing and Patient connected to face mask oxygen  Post-op Assessment: Report given to RN, Post -op Vital signs reviewed and stable and Patient moving all extremities  Post vital signs: Reviewed and stable  Last Vitals:  Vitals Value Taken Time  BP    Temp    Pulse    Resp    SpO2      Last Pain:  Vitals:   05/11/18 1144  TempSrc: Oral  PainSc: 8       Patients Stated Pain Goal: 3 (92/41/55 1614)  Complications: No apparent anesthesia complications

## 2018-05-11 NOTE — Anesthesia Preprocedure Evaluation (Signed)
Anesthesia Evaluation  Patient identified by MRN, date of birth, ID band Patient awake    Reviewed: Allergy & Precautions, NPO status , Patient's Chart, lab work & pertinent test results  Airway Mallampati: II  TM Distance: >3 FB Neck ROM: Full    Dental no notable dental hx.    Pulmonary neg pulmonary ROS, Current Smoker,    Pulmonary exam normal breath sounds clear to auscultation       Cardiovascular Exercise Tolerance: Good negative cardio ROS Normal cardiovascular examI Rhythm:Regular Rate:Normal     Neuro/Psych  Headaches, PSYCHIATRIC DISORDERS  Neuromuscular disease negative neurological ROS  negative psych ROS   GI/Hepatic negative GI ROS, Neg liver ROS, GERD  Medicated and Controlled,Denies Sx today    Endo/Other  negative endocrine ROS  Renal/GU negative Renal ROS  negative genitourinary   Musculoskeletal negative musculoskeletal ROS (+) Arthritis , Osteoarthritis,  Fibromyalgia -  Abdominal   Peds negative pediatric ROS (+)  Hematology negative hematology ROS (+)   Anesthesia Other Findings   Reproductive/Obstetrics negative OB ROS                             Anesthesia Physical Anesthesia Plan  ASA: II  Anesthesia Plan: General   Post-op Pain Management:    Induction: Intravenous  PONV Risk Score and Plan:   Airway Management Planned: Oral ETT  Additional Equipment:   Intra-op Plan:   Post-operative Plan: Extubation in OR  Informed Consent: I have reviewed the patients History and Physical, chart, labs and discussed the procedure including the risks, benefits and alternatives for the proposed anesthesia with the patient or authorized representative who has indicated his/her understanding and acceptance.   Dental advisory given  Plan Discussed with: CRNA  Anesthesia Plan Comments:         Anesthesia Quick Evaluation

## 2018-05-11 NOTE — Interval H&P Note (Signed)
History and Physical Interval Note:  05/11/2018 11:59 AM  [name]  has presented today for surgery, with the diagnosis of cholelithiasis  The various methods of treatment have been discussed with the patient and family. After consideration of risks, benefits and other options for treatment, the patient has consented to  Procedure(s): LAPAROSCOPIC CHOLECYSTECTOMY (N/A) as a surgical intervention .  The patient's history has been reviewed, patient examined, no change in status, stable for surgery.  I have reviewed the patient's chart and labs.  Questions were answered to the patient's satisfaction.    Patient with concern for cholecystitis but also with some degree of enteritis/ has sick contacts at home. Most of her abdominal pain is in the upper quadrant/ epigastric region. No history of blood per rectum or Crohn's /IBD. Think she has two separate things going on but most of the pain complaints more consistent with gallbladder issue at this time.   No additional questions.   Virl Cagey

## 2018-05-11 NOTE — Anesthesia Procedure Notes (Signed)
Procedure Name: Intubation Date/Time: 05/11/2018 12:30 PM Performed by: Charmaine Downs, CRNA Pre-anesthesia Checklist: Patient identified, Patient being monitored, Timeout performed, Emergency Drugs available and Suction available Patient Re-evaluated:Patient Re-evaluated prior to induction Oxygen Delivery Method: Circle System Utilized Preoxygenation: Pre-oxygenation with 100% oxygen Induction Type: IV induction Ventilation: Mask ventilation without difficulty Laryngoscope Size: Mac and 4 Grade View: Grade I Tube type: Oral Tube size: 7.0 mm Number of attempts: 1 Airway Equipment and Method: stylet Placement Confirmation: ETT inserted through vocal cords under direct vision,  positive ETCO2 and breath sounds checked- equal and bilateral Secured at: 22 cm Tube secured with: Tape Dental Injury: Teeth and Oropharynx as per pre-operative assessment

## 2018-05-11 NOTE — Progress Notes (Signed)
[facility]  Lap chole completed. Diet and po meds ordered. Has some degree of enteritis on inspection but bowel normal caliber and peristalsis normal. Can d/c home from gallbladder surgery once tolerates diet and takes po pain med.   Defer to GI/ Dr. Carles Collet for enteritis. Had sick contacts at home. Does not need antibiotics for gallbladder post op.   Follow up in 2 weeks in office for wound check.   Curlene Labrum, MD [facility] [address] [place], [place] [address] [phone] (office)

## 2018-05-11 NOTE — Progress Notes (Addendum)
PROGRESS NOTE  Ashanta Amoroso Membreno HDQ:[id number] DOB: January 12, 1971 DOA: 05/09/2018 PCP: Patient, No Pcp Per  Brief History:  47 y.o.femalewith medical history ofchronic pain syndrome, tobacco abuse, fibromyalgia presenting with 1 week history of generalized abdominal pain with associated nausea and vomiting. The patient complained of low-grade temperature up to 100.0 F at home. She denies any recent sick contacts. The patient also developed loose stools, but this has resolved within the last 48 hours. She denies any hematochezia, melena, hematemesis. The patient visited the emergency department on 05/07/2018. She was treated symptomatically and released home. She returned on 06/04/2018. CT of the abdomen and pelvis at that time showed mild intrahepatic ductal dilatation with common bile duct of 10 mm. There was also gallbladder wall thickening with a small amount of pericholecystic fluid. There was also bowel wall thickening involving the jejunum to the terminal ileum. The patient was offered admission, but because of childcare issues, the patient did not want to stay in the hospital. She returned the morning of 05/09/2018 because of continued vomiting and abdominal pain. WBC was noted to be 12.9. BMP was essentially unremarkable with potassium 3.4. Abdominal ultrasound was obtained and showed cholelithiasis with positive Murphy sign but without any gallbladder wall thickening. Common bile duct was noted to be 7 mm. General surgery was consulted to assist.    Assessment/Plan: Symptomatic cholelithiasis/acute cholecystitis -MRCP--no choledocholithiasis, CBD 61mm -Start meropenem due to penicillin allergy-->d/c post op -IV fluids -Pain control -General surgery consult appreciated -05/11/18--lap chole -transition to po opioids to prepare to d/c  Chronic pain syndrome -The patient gives a history that is inconsistent withwhat is documented in the [place] opioid database -she states  she is no longer taking suboxone, but PMP Aware shows that suboxone was filled on 05/07/18, 04/26/18, 04/19/18, and 04/07/18 -nevertheless, judicious pain controlled will be required  SB wall thickening -enteritis vs reactive due to process in GB -not clear if this is contributing to patient's pain -GI consulted-->out patient endoscopy -diarrhea has resolved -monitor clinically  Hypokalemia -replete -check mag--1.8  Tobacco abuse -nicoderm patch deferred -I have discussed tobacco cessation with the patient. I have counseled the patient regarding the negative impacts of continued tobacco use including but not limited to lung cancer, COPD, and cardiovascular disease. I have discussed alternatives to tobacco and modalities that may help facilitate tobacco cessation including but not limited to biofeedback, hypnosis, and medications. Total time spent with tobacco counseling was 4 minutes.     Disposition Plan:   Home 8/7 if stable Family Communication:  significant other updated at bedside  Consultants:  General surgery, GI  Code Status:  FULL  DVT Prophylaxis:  [place] Heparin    Procedures: As Listed in Progress Note Above  Antibiotics: merrem 8/4>>>8/6    Subjective: Patient complains of abdominal pain postoperatively.  She states her nausea and vomiting have improved.  There is no diarrhea today.  She denies any headache, chest pain, shortness breath, cough, hemoptysis.  Objective: Vitals:   05/11/18 1415 05/11/18 1429 05/11/18 1430 05/11/18 1536  BP: 124/73  127/73 138/81  Pulse: 67 63 68 66  Resp: (!) 7 12 11 16   Temp:    97.8 F (36.6 C)  TempSrc:    Oral  SpO2: 90% 95% 96% 99%  Weight:      Height:        Intake/Output Summary (Last 24 hours) at 05/11/2018 1657 Last data filed at 05/11/2018 1308 Gross per 24  hour  Intake 3668.33 ml  Output 800 ml  Net 2868.33 ml   Weight change:  Exam:   General:  Pt is alert, follows commands appropriately,  not in acute distress  HEENT: No icterus, No thrush, No neck mass, [place]/AT  Cardiovascular: RRR, S1/S2, no rubs, no gallops  Respiratory: CTA bilaterally, no wheezing, no crackles, no rhonchi  Abdomen: Soft/+BS, diffusely tender, non distended, no guarding  Extremities: No edema, No lymphangitis, No petechiae, No rashes, no synovitis   Data Reviewed: I have personally reviewed following labs and imaging studies Basic Metabolic Panel: Recent Labs  Lab 05/07/18 1045 05/08/18 1628 05/09/18 0945 05/10/18 0410 05/11/18 0505  NA 139 138 137 137 136  K 4.1 3.4* 3.4* 3.5 3.5  CL 107 105 106 107 102  CO2 27 27 25 25 28   GLUCOSE 128* 136* 124* 80 82  BUN 10 7 7  5* <5*  CREATININE 0.81 0.75 0.68 0.65 0.71  CALCIUM 8.4* 8.8* 8.5* 8.2* 8.4*  MG  --   --   --  1.8  --    Liver Function Tests: Recent Labs  Lab 05/07/18 1045 05/08/18 1628 05/09/18 0945 05/10/18 0410 05/11/18 0505  AST 12* 11* 10* 9* 8*  ALT 11 10 11 8 9   ALKPHOS 66 64 61 60 66  BILITOT 1.0 1.1 0.9 0.8 0.8  PROT 6.3* 6.6 6.6 6.1* 6.5  ALBUMIN 3.4* 3.5 3.6 3.3* 3.3*   Recent Labs  Lab 05/07/18 1045 05/08/18 1628 05/09/18 0945  LIPASE 33 36 33   No results for input(s): AMMONIA in the last 168 hours. Coagulation Profile: No results for input(s): INR, PROTIME in the last 168 hours. CBC: Recent Labs  Lab 05/07/18 1045 05/08/18 1628 05/09/18 0945 05/10/18 0410 05/11/18 0505  WBC 11.4* 8.4 12.9* 12.3* 14.6*  NEUTROABS 9.3* 6.0 10.8*  --   --   HGB 14.6 14.0 13.9 13.1 14.1  HCT 43.7 42.2 41.3 40.8 42.8  MCV 90.3 90.2 90.0 91.7 90.9  PLT 186 178 192 174 189   Cardiac Enzymes: No results for input(s): CKTOTAL, CKMB, CKMBINDEX, TROPONINI in the last 168 hours. BNP: Invalid input(s): POCBNP CBG: No results for input(s): GLUCAP in the last 168 hours. HbA1C: No results for input(s): HGBA1C in the last 72 hours. Urine analysis:    Component Value Date/Time   COLORURINE YELLOW 05/08/2018 1630    APPEARANCEUR CLEAR 05/08/2018 1630   LABSPEC 1.016 05/08/2018 1630   PHURINE 6.0 05/08/2018 1630   GLUCOSEU NEGATIVE 05/08/2018 1630   HGBUR NEGATIVE 05/08/2018 1630   BILIRUBINUR NEGATIVE 05/08/2018 1630   KETONESUR 5 (A) 05/08/2018 1630   PROTEINUR NEGATIVE 05/08/2018 1630   UROBILINOGEN 0.2 06/27/2015 1614   NITRITE NEGATIVE 05/08/2018 1630   LEUKOCYTESUR NEGATIVE 05/08/2018 1630   Sepsis Labs: @LABRCNTIP (procalcitonin:4,lacticidven:4) ) Recent Results (from the past 240 hour(s))  Surgical pcr screen     Status: None   Collection Time: 05/10/18  8:45 PM  Result Value Ref Range Status   MRSA, PCR NEGATIVE NEGATIVE Final   Staphylococcus aureus NEGATIVE NEGATIVE Final    Comment: (NOTE) The Xpert SA Assay (FDA approved for NASAL specimens in patients 54 years of age and older), is one component of a comprehensive surveillance program. It is not intended to diagnose infection nor to guide or monitor treatment. Performed at [facility], [address], [place],  [address]      Scheduled Meds: . heparin  5,000 Units Subcutaneous Q8H   Continuous Infusions: . 0.9 % NaCl  with KCl 20 mEq / L 100 mL/hr at 05/11/18 0307  . meropenem (MERREM) IV 1 g (05/11/18 1542)    Procedures/Studies: Ct Abdomen Pelvis W Contrast  Result Date: 05/08/2018 CLINICAL DATA:  Per ED notes: Patient c/o upper abd pain/spasms x2 days. Patient states nausea, vomiting, diarrhea, and intermittent fevers. Denies any urinary symptoms EXAM: CT ABDOMEN AND PELVIS WITH CONTRAST TECHNIQUE: Multidetector CT imaging of the abdomen and pelvis was performed using the standard protocol following bolus administration of intravenous contrast. CONTRAST:  127mL ISOVUE-300 IOPAMIDOL (ISOVUE-300) INJECTION 61% COMPARISON:  CT of the abdomen and pelvis on 03/16/2018 FINDINGS: Lower chest: No pulmonary nodules, pleural effusions, or infiltrates. Heart size is normal. Hepatobiliary: No pericardial effusion or significant  coronary artery calcifications. The there is mild intrahepatic biliary duct dilatation. Common bile duct is 10 millimeters. Gallbladder appears contracted. Gallbladder wall is thickened or there is small amount of pericholecystic fluid. Pancreas: There is increased, mild dilatation of the pancreatic duct. Spleen: Normal in size without focal abnormality. Adrenals/Urinary Tract: Adrenal glands are normal in appearance. The RIGHT kidney is diminutive and there are linear areas of scarring throughout the kidney. No hydronephrosis. The LEFT kidney is normal in appearance. No suspicious renal mass. The urinary bladder is decompressed. Stomach/Bowel: The stomach is normal in appearance. There is bowel wall thickening and inflammation involving the majority of the small bowel, beginning with the jejunum and extending to the terminal ileum. The findings are diffuse and symmetric. There are small collections of mesenteric fluid. The colon is unremarkable. There is a small amount of fluid surrounding the appendix but this appears to be a secondary process. The appendix is normal in diameter. Vascular/Lymphatic: No significant vascular findings are present. No enlarged abdominal or pelvic lymph nodes. Reproductive: Uterus is present.  No adnexal mass. Other: There is a small amount of free pelvic fluid, increased since prior study. Musculoskeletal: No acute or significant osseous findings. IMPRESSION: 1. Significant small bowel abnormality without change in distribution. Findings are consistent with enteritis of the mid and distal small bowel. No evidence for perforation or abscess. 2. Increased ascites. 3. Increased intrahepatic and pancreatic duct dilatation. 4. Question of gallbladder wall thickening versus pericholecystic fluid. Consider further evaluation with RIGHT UPPER QUADRANT ultrasound. Electronically Signed   By: Nolon Nations M.D.   On: 05/08/2018 18:01   Mr 3d Recon At Scanner  Result Date:  05/10/2018 CLINICAL DATA:  Abdominal pain with swelling and nausea. Intrahepatic and pancreatic duct dilatation with cholelithiasis along with small bowel wall thickening. EXAM: MRI ABDOMEN WITHOUT AND WITH CONTRAST (INCLUDING MRCP) TECHNIQUE: Multiplanar multisequence MR imaging of the abdomen was performed both before and after the administration of intravenous contrast. Heavily T2-weighted images of the biliary and pancreatic ducts were obtained, and three-dimensional MRCP images were rendered by post processing. CONTRAST:  33mL MULTIHANCE GADOBENATE DIMEGLUMINE 529 MG/ML IV SOLN COMPARISON:  Multiple exams, including 05/08/2018 CT scan and ultrasound from 05/09/2018 FINDINGS: Despite efforts by the technologist and patient, motion artifact is present on today's exam and could not be eliminated. This reduces exam sensitivity and specificity. Lower chest: Unremarkable Hepatobiliary: Multiple filling defects in the gallbladder are present measuring up to 0.9 cm in long axis. Some of these are non dependent. There is dilatation of the common bile duct at 0.9 cm with distal conical tapering and without appreciable distal filling defect in the common bile duct, subject to the limitations of motion artifact. Minimal intrahepatic biliary dilatation. No irregularity of the biliary system or definite  abnormal enhancement along the biliary walls. No pericholecystic fluid. There is an approximately 3 mm T2 hyperintense lesion laterally in the right hepatic lobe on image 20/5 without appreciable enhancement, probably a tiny cyst but technically too small to characterize. No worrisome abnormal enhancement in the liver. Pancreas: Currently no significant dorsal pancreatic duct dilatation. No abnormal pancreatic enhancement. Spleen:  Unremarkable Adrenals/Urinary Tract: Right renal atrophy and scarring. Adrenal glands normal. Stomach/Bowel: On the coronal images, some of the pelvic loops of thickened distal small bowel are  identified, likely to include the terminal ileum. Some of these loops are borderline dilated. Vascular/Lymphatic:  Unremarkable Other: Trace perihepatic ascites posteriorly on images 24-27 of series 13. Musculoskeletal: Unremarkable IMPRESSION: 1. Cholelithiasis with dilated common bile duct up to 9 mm in diameter, without current dilatation of the dorsal pancreatic duct. Note that the patient has previously also had dilated common bile duct, up to 8 mm in diameter on 12/04/2016. No definite stone is identified in the common bile duct, with understanding that there is some degradation of negative predictive value due to motion artifact. Acute cholecystitis is not excluded. 2. Trace perihepatic ascites adjacent to the right hepatic lobe inferiorly. 3. No abnormal enhancement along the biliary tree to suggest cholangitis. 4. Thickened distal small bowel loops, including the terminal ileum, potentially from infectious enteritis or inflammatory bowel disease. 5. Right renal scarring and resulting atrophy. Electronically Signed   By: Van Clines M.D.   On: 05/10/2018 09:33   Dg Chest Port 1 View  Result Date: 05/10/2018 CLINICAL DATA:  Preop EXAM: PORTABLE CHEST 1 VIEW COMPARISON:  02/11/2018 FINDINGS: Normal heart size. Lungs clear. No pneumothorax. No pleural effusion. IMPRESSION: No active disease. Electronically Signed   By: Marybelle Killings M.D.   On: 05/10/2018 18:59   Mr Abdomen Mrcp Moise Boring Contast  Result Date: 05/10/2018 CLINICAL DATA:  Abdominal pain with swelling and nausea. Intrahepatic and pancreatic duct dilatation with cholelithiasis along with small bowel wall thickening. EXAM: MRI ABDOMEN WITHOUT AND WITH CONTRAST (INCLUDING MRCP) TECHNIQUE: Multiplanar multisequence MR imaging of the abdomen was performed both before and after the administration of intravenous contrast. Heavily T2-weighted images of the biliary and pancreatic ducts were obtained, and three-dimensional MRCP images were rendered by  post processing. CONTRAST:  60mL MULTIHANCE GADOBENATE DIMEGLUMINE 529 MG/ML IV SOLN COMPARISON:  Multiple exams, including 05/08/2018 CT scan and ultrasound from 05/09/2018 FINDINGS: Despite efforts by the technologist and patient, motion artifact is present on today's exam and could not be eliminated. This reduces exam sensitivity and specificity. Lower chest: Unremarkable Hepatobiliary: Multiple filling defects in the gallbladder are present measuring up to 0.9 cm in long axis. Some of these are non dependent. There is dilatation of the common bile duct at 0.9 cm with distal conical tapering and without appreciable distal filling defect in the common bile duct, subject to the limitations of motion artifact. Minimal intrahepatic biliary dilatation. No irregularity of the biliary system or definite abnormal enhancement along the biliary walls. No pericholecystic fluid. There is an approximately 3 mm T2 hyperintense lesion laterally in the right hepatic lobe on image 20/5 without appreciable enhancement, probably a tiny cyst but technically too small to characterize. No worrisome abnormal enhancement in the liver. Pancreas: Currently no significant dorsal pancreatic duct dilatation. No abnormal pancreatic enhancement. Spleen:  Unremarkable Adrenals/Urinary Tract: Right renal atrophy and scarring. Adrenal glands normal. Stomach/Bowel: On the coronal images, some of the pelvic loops of thickened distal small bowel are identified, likely to include the terminal  ileum. Some of these loops are borderline dilated. Vascular/Lymphatic:  Unremarkable Other: Trace perihepatic ascites posteriorly on images 24-27 of series 13. Musculoskeletal: Unremarkable IMPRESSION: 1. Cholelithiasis with dilated common bile duct up to 9 mm in diameter, without current dilatation of the dorsal pancreatic duct. Note that the patient has previously also had dilated common bile duct, up to 8 mm in diameter on 12/04/2016. No definite stone is  identified in the common bile duct, with understanding that there is some degradation of negative predictive value due to motion artifact. Acute cholecystitis is not excluded. 2. Trace perihepatic ascites adjacent to the right hepatic lobe inferiorly. 3. No abnormal enhancement along the biliary tree to suggest cholangitis. 4. Thickened distal small bowel loops, including the terminal ileum, potentially from infectious enteritis or inflammatory bowel disease. 5. Right renal scarring and resulting atrophy. Electronically Signed   By: Van Clines M.D.   On: 05/10/2018 09:33   US Abdomen Limited Ruq  Result Date: 05/09/2018 CLINICAL DATA:  Right upper quadrant abdominal pain for 1 week with nausea, vomiting and diarrhea. EXAM: ULTRASOUND ABDOMEN LIMITED RIGHT UPPER QUADRANT COMPARISON:  CT 05/08/2018. FINDINGS: Gallbladder: Multiple gallstones are again noted. The gallbladder is better distended and demonstrates no wall thickening. However, the patient is reported to have a sonographic Murphy's sign by the sonographer. Common bile duct: Diameter: 7 mm.  Less dilated than yesterday's CT. Liver: No focal lesion identified. Within normal limits in parenchymal echogenicity. Portal vein is patent on color Doppler imaging with normal direction of blood flow towards the liver. IMPRESSION: 1. Cholelithiasis without objective signs of cholecystitis. The patient is reported to have a sonographic Murphy sign, however. 2. Mild biliary dilatation, improved from recent CT. Electronically Signed   By: Richardean Sale M.D.   On: 05/09/2018 10:17    Orson Eva, DO  [facility] Pager [phone]  If 7PM-7AM, please contact night-coverage [email or web address] Password [email or web address] 05/11/2018, 4:57 PM   LOS: 2 days

## 2018-05-11 NOTE — Consult Note (Addendum)
Referring Provider: Dr. Carles Collet  Primary Care Physician:  Patient, No Pcp Per Primary Gastroenterologist:  Dr. Oneida Alar   Date of Admission: 05/09/18 Date of Consultation: 05/10/18  Reason for Consultation:  Enteritis   HPI:  [name] is a 47 y.o. year old female who was admitted with abdominal pain, N/V, found to have cholecystitis and mild biliary dilatation of 10 mm on CT May 08, 2018. US abdomen 8/4 with cholelithiasis, mild biliary dilatation improved from CT. MRCP then completed yesterday without definite stone but motion artifact present on exam. LFTs remaining normal. Lipase normal. Plans for cholecystectomy today by Dr. Constance Haw. GI consulted due to small bowel wall thickening and inflammation involving majority of small bowel, beginning with jejunum and extending to the TI. Noted as diffuse and symmetric.   She notes diarrhea was present prior to admission but then resolved. She had recurrent loose stools this morning. Abdominal pain noted as diffuse. No rectal bleeding. No chronic history of lower or upper GI symptoms. Denies any significant changes in bowel habits. Appears in discomfort at time of consultation and drowsy. No prior GI care. States her kids had an acute GI illness with N/V/D about a month ago. No other recent sick contacts. No prior colonoscopy/EGD.   Upon review of epic, she also had enteritis of mid and distal small bowel including the TI in June 2019 on CT. She was treated with Cipro and Flagyl. She was seen in the ED multiple times June 2019 for N/V/D.   Past Medical History:  Diagnosis Date  . Adjustment disorder   . Arthritis    hands, knees  . Endometrial polyp   . Fibromyalgia   . Headache(784.0)   . Pregnancy   . SVD (spontaneous vaginal delivery)    x 1    Past Surgical History:  Procedure Laterality Date  . CESAREAN SECTION     x 4  . right thumb surgery     pins  . WISDOM TOOTH EXTRACTION      Prior to Admission medications   Medication Sig  Start Date End Date Taking? Authorizing Provider  omeprazole (PRILOSEC) 40 MG capsule TAKE ONE CAPSULE BY MOUTH DAILY. 12/14/17  Yes Jonnie Kind, MD  calcium carbonate (TUMS - DOSED IN MG ELEMENTAL CALCIUM) 500 MG chewable tablet Chew 1 tablet by mouth 3 (three) times daily as needed for indigestion or heartburn.    [provider]  ciprofloxacin (CIPRO) 500 MG/5ML (10%) suspension Take 5 mLs (500 mg total) by mouth 2 (two) times daily. Patient not taking: Reported on 05/07/2018 03/20/18   Larene Pickett, PA-C  dicyclomine (BENTYL) 20 MG tablet Take 1 tablet (20 mg total) by mouth every 6 (six) hours as needed for spasms. Patient not taking: Reported on 05/09/2018 05/07/18   Virgel Manifold, MD  HYDROcodone-acetaminophen (HYCET) 7.5-325 mg/15 ml solution Take 15 mLs by mouth every 8 (eight) hours as needed for moderate pain. Patient not taking: Reported on 05/08/2018 03/20/18   Larene Pickett, PA-C  metoCLOPramide (REGLAN) 10 MG tablet Take 1 tablet (10 mg total) by mouth every 8 (eight) hours as needed for nausea or vomiting. Patient not taking: Reported on 05/08/2018 03/16/18   Francine Graven, DO  metroNIDAZOLE (FLAGYL) 50 mg/ml oral suspension Take 10 mLs (500 mg total) by mouth 2 (two) times daily. Patient not taking: Reported on 05/07/2018 03/20/18   Larene Pickett, PA-C  ondansetron (ZOFRAN ODT) 4 MG disintegrating tablet Take 1 tablet (4 mg total)  by mouth every 8 (eight) hours as needed for nausea. Patient not taking: Reported on 05/08/2018 03/20/18   Larene Pickett, PA-C  ondansetron (ZOFRAN) 4 MG tablet TAKE (1) TABLET BY MOUTH EVERY (6) HOURS AS NEEDED FOR NAUSEA. Patient not taking: Reported on 05/08/2018 04/13/17   Florian Buff, MD  ondansetron (ZOFRAN) 4 MG tablet Take 1 tablet (4 mg total) by mouth every 6 (six) hours. Patient not taking: Reported on 05/09/2018 05/07/18   Virgel Manifold, MD  oxyCODONE-acetaminophen (PERCOCET/ROXICET) 5-325 MG tablet Take 2 tablets by mouth every 6 (six)  hours as needed for severe pain. Patient not taking: Reported on 05/09/2018 05/08/18   Hayden Rasmussen, MD  SUBOXONE 8-2 MG FILM Place 2 Film under the tongue daily. 05/01/18   [provider]    Current Facility-Administered Medications  Medication Dose Route Frequency Provider Last Rate Last Dose  . 0.9 % NaCl with KCl 20 mEq/ L  infusion   Intravenous Continuous Tat, David, MD 100 mL/hr at 05/11/18 0307    . acetaminophen (TYLENOL) tablet 650 mg  650 mg Oral Q6H PRN Tat, Shanon Brow, MD   650 mg at 05/10/18 2132   Or  . acetaminophen (TYLENOL) suppository 650 mg  650 mg Rectal Q6H PRN Tat, Shanon Brow, MD      . ciprofloxacin (CIPRO) IVPB 400 mg  400 mg Intravenous On Call to OR Virl Cagey, MD      . heparin injection 5,000 Units  5,000 Units Subcutaneous Franco Collet, MD   5,000 Units at 05/11/18 [phone]  . HYDROmorphone (DILAUDID) injection 0.5 mg  0.5 mg Intravenous Q4H PRN Orson Eva, MD   0.5 mg at 05/11/18 0640  . LORazepam (ATIVAN) injection 1 mg  1 mg Intravenous Q4H PRN Aviva Signs, MD   1 mg at 05/10/18 0831  . meropenem (MERREM) 1 g in sodium chloride 0.9 % 100 mL IVPB  1 g Intravenous Franco Collet, MD   Stopped at 05/11/18 [phone]  . ondansetron (ZOFRAN) tablet 4 mg  4 mg Oral Q6H PRN Tat, David, MD       Or  . ondansetron [facility]) injection 4 mg  4 mg Intravenous Q6H PRN Orson Eva, MD   4 mg at 05/11/18 0640    Allergies as of 05/09/2018 - Review Complete 05/09/2018  Allergen Reaction Noted  . Aspirin Shortness Of Breath 08/20/2011  . Nsaids Shortness Of Breath 03/08/2017  . Latex Swelling 05/28/2012  . Morphine and related Itching 02/22/2018  . Phenergan [promethazine] Other (See Comments) 02/02/2015  . Penicillins Rash and Other (See Comments) 08/20/2011  . Toradol [ketorolac tromethamine] Nausea And Vomiting 03/14/2018    Family History  Problem Relation Age of Onset  . Hypertension Mother   . Diabetes Mother   . Cancer Mother        lung  . CAD Father   .  Heart failure Father   . Heart disease Father   . Seizures Son   . Diabetes Other   . Heart failure Other   . Hypertension Other   . Cancer Other   . Colon cancer Neg Hx   . Colon polyps Neg Hx   . Inflammatory bowel disease Neg Hx     Social History   Socioeconomic History  . Marital status: Single    Spouse name: Not on file  . Number of children: Not on file  . Years of education: Not on file  . Highest education level: Not on file  Occupational History  . Not on file  Social Needs  . Financial resource strain: Not on file  . Food insecurity:    Worry: Not on file    Inability: Not on file  . Transportation needs:    Medical: Not on file    Non-medical: Not on file  Tobacco Use  . Smoking status: Current Every Day Smoker    Packs/day: 0.50    Years: 10.00    Pack years: 5.00    Types: Cigarettes  . Smokeless tobacco: Never Used  . Tobacco comment: 5-6 cigarettes a day  Substance and Sexual Activity  . Alcohol use: No    Alcohol/week: 0.0 oz  . Drug use: No  . Sexual activity: Yes    Birth control/protection: None  Lifestyle  . Physical activity:    Days per week: Not on file    Minutes per session: Not on file  . Stress: Not on file  Relationships  . Social connections:    Talks on phone: Not on file    Gets together: Not on file    Attends religious service: Not on file    Active member of club or organization: Not on file    Attends meetings of clubs or organizations: Not on file    Relationship status: Not on file  . Intimate partner violence:    Fear of current or ex partner: Not on file    Emotionally abused: Not on file    Physically abused: Not on file    Forced sexual activity: Not on file  Other Topics Concern  . Not on file  Social History Narrative  . Not on file    Review of Systems: Gen: see HPI  CV: Denies chest pain, heart palpitations, syncope, edema  Resp: Denies shortness of breath with rest, cough, wheezing GI: see HPI  GU  : Denies urinary burning, urinary frequency, urinary incontinence.  MS: Denies joint pain,swelling, cramping Derm: Denies rash, itching, dry skin Psych: Denies depression, anxiety,confusion, or memory loss Heme: see HPI   Physical Exam: Vital signs in last 24 hours: Temp:  [98.8 F (37.1 C)-99.7 F (37.6 C)] 98.8 F (37.1 C) (08/06 1275) Pulse Rate:  [75-86] 75 (08/06 0638) Resp:  [18-20] 18 (08/06 1700) BP: (138-158)/(77-97) 138/77 (08/06 1749) SpO2:  [97 %-99 %] 99 % (08/06 4496) Last BM Date: 05/10/18 General:  Drowsy but easily awakens, appears uncomfortable, flat affect Head:  Normocephalic and atraumatic. Eyes:  Sclera clear, no icterus.   Conjunctiva pink. Ears:  Normal auditory acuity. Nose:  No deformity, discharge,  or lesions. Lungs:  Clear throughout to auscultation.    Heart:  S1 S2 present without murmurs  Abdomen:  Soft, +TTP diffusely but moreso epigastric/RUQ, +BS Rectal:  Deferred until time of colonoscopy.   Msk:  Symmetrical without gross deformities. Normal posture. Extremities:  Without edema. Neurologic:  Alert and  oriented x4 Psych:  Alert and cooperative. Normal mood and affect.  Intake/Output from previous day: 08/05 0701 - 08/06 0700 In: 3168.3 [I.V.:2768.3; IV Piggyback:400] Out: 800 [Urine:800] Intake/Output this shift: No intake/output data recorded.  Lab Results: Recent Labs    05/09/18 0945 05/10/18 0410 05/11/18 0505  WBC 12.9* 12.3* 14.6*  HGB 13.9 13.1 14.1  HCT 41.3 40.8 42.8  PLT 192 174 189   BMET Recent Labs    05/09/18 0945 05/10/18 0410 05/11/18 0505  NA 137 137 136  K 3.4* 3.5 3.5  CL 106 107 102  CO2 25 25 28  GLUCOSE 124* 80 82  BUN 7 5* <5*  CREATININE 0.68 0.65 0.71  CALCIUM 8.5* 8.2* 8.4*   LFT Recent Labs    05/09/18 0945 05/10/18 0410 05/11/18 0505  PROT 6.6 6.1* 6.5  ALBUMIN 3.6 3.3* 3.3*  AST 10* 9* 8*  ALT 11 8 9   ALKPHOS 61 60 66  BILITOT 0.9 0.8 0.8    Studies/Results: Mr 3d Recon At  Scanner  Result Date: 05/10/2018 CLINICAL DATA:  Abdominal pain with swelling and nausea. Intrahepatic and pancreatic duct dilatation with cholelithiasis along with small bowel wall thickening. EXAM: MRI ABDOMEN WITHOUT AND WITH CONTRAST (INCLUDING MRCP) TECHNIQUE: Multiplanar multisequence MR imaging of the abdomen was performed both before and after the administration of intravenous contrast. Heavily T2-weighted images of the biliary and pancreatic ducts were obtained, and three-dimensional MRCP images were rendered by post processing. CONTRAST:  69mL MULTIHANCE GADOBENATE DIMEGLUMINE 529 MG/ML IV SOLN COMPARISON:  Multiple exams, including 05/08/2018 CT scan and ultrasound from 05/09/2018 FINDINGS: Despite efforts by the technologist and patient, motion artifact is present on today's exam and could not be eliminated. This reduces exam sensitivity and specificity. Lower chest: Unremarkable Hepatobiliary: Multiple filling defects in the gallbladder are present measuring up to 0.9 cm in long axis. Some of these are non dependent. There is dilatation of the common bile duct at 0.9 cm with distal conical tapering and without appreciable distal filling defect in the common bile duct, subject to the limitations of motion artifact. Minimal intrahepatic biliary dilatation. No irregularity of the biliary system or definite abnormal enhancement along the biliary walls. No pericholecystic fluid. There is an approximately 3 mm T2 hyperintense lesion laterally in the right hepatic lobe on image 20/5 without appreciable enhancement, probably a tiny cyst but technically too small to characterize. No worrisome abnormal enhancement in the liver. Pancreas: Currently no significant dorsal pancreatic duct dilatation. No abnormal pancreatic enhancement. Spleen:  Unremarkable Adrenals/Urinary Tract: Right renal atrophy and scarring. Adrenal glands normal. Stomach/Bowel: On the coronal images, some of the pelvic loops of thickened  distal small bowel are identified, likely to include the terminal ileum. Some of these loops are borderline dilated. Vascular/Lymphatic:  Unremarkable Other: Trace perihepatic ascites posteriorly on images 24-27 of series 13. Musculoskeletal: Unremarkable IMPRESSION: 1. Cholelithiasis with dilated common bile duct up to 9 mm in diameter, without current dilatation of the dorsal pancreatic duct. Note that the patient has previously also had dilated common bile duct, up to 8 mm in diameter on 12/04/2016. No definite stone is identified in the common bile duct, with understanding that there is some degradation of negative predictive value due to motion artifact. Acute cholecystitis is not excluded. 2. Trace perihepatic ascites adjacent to the right hepatic lobe inferiorly. 3. No abnormal enhancement along the biliary tree to suggest cholangitis. 4. Thickened distal small bowel loops, including the terminal ileum, potentially from infectious enteritis or inflammatory bowel disease. 5. Right renal scarring and resulting atrophy. Electronically Signed   By: Van Clines M.D.   On: 05/10/2018 09:33   Dg Chest Port 1 View  Result Date: 05/10/2018 CLINICAL DATA:  Preop EXAM: PORTABLE CHEST 1 VIEW COMPARISON:  02/11/2018 FINDINGS: Normal heart size. Lungs clear. No pneumothorax. No pleural effusion. IMPRESSION: No active disease. Electronically Signed   By: Marybelle Killings M.D.   On: 05/10/2018 18:59   Mr Abdomen Mrcp Moise Boring Contast  Result Date: 05/10/2018 CLINICAL DATA:  Abdominal pain with swelling and nausea. Intrahepatic and pancreatic duct dilatation with cholelithiasis along with  small bowel wall thickening. EXAM: MRI ABDOMEN WITHOUT AND WITH CONTRAST (INCLUDING MRCP) TECHNIQUE: Multiplanar multisequence MR imaging of the abdomen was performed both before and after the administration of intravenous contrast. Heavily T2-weighted images of the biliary and pancreatic ducts were obtained, and three-dimensional MRCP  images were rendered by post processing. CONTRAST:  58mL MULTIHANCE GADOBENATE DIMEGLUMINE 529 MG/ML IV SOLN COMPARISON:  Multiple exams, including 05/08/2018 CT scan and ultrasound from 05/09/2018 FINDINGS: Despite efforts by the technologist and patient, motion artifact is present on today's exam and could not be eliminated. This reduces exam sensitivity and specificity. Lower chest: Unremarkable Hepatobiliary: Multiple filling defects in the gallbladder are present measuring up to 0.9 cm in long axis. Some of these are non dependent. There is dilatation of the common bile duct at 0.9 cm with distal conical tapering and without appreciable distal filling defect in the common bile duct, subject to the limitations of motion artifact. Minimal intrahepatic biliary dilatation. No irregularity of the biliary system or definite abnormal enhancement along the biliary walls. No pericholecystic fluid. There is an approximately 3 mm T2 hyperintense lesion laterally in the right hepatic lobe on image 20/5 without appreciable enhancement, probably a tiny cyst but technically too small to characterize. No worrisome abnormal enhancement in the liver. Pancreas: Currently no significant dorsal pancreatic duct dilatation. No abnormal pancreatic enhancement. Spleen:  Unremarkable Adrenals/Urinary Tract: Right renal atrophy and scarring. Adrenal glands normal. Stomach/Bowel: On the coronal images, some of the pelvic loops of thickened distal small bowel are identified, likely to include the terminal ileum. Some of these loops are borderline dilated. Vascular/Lymphatic:  Unremarkable Other: Trace perihepatic ascites posteriorly on images 24-27 of series 13. Musculoskeletal: Unremarkable IMPRESSION: 1. Cholelithiasis with dilated common bile duct up to 9 mm in diameter, without current dilatation of the dorsal pancreatic duct. Note that the patient has previously also had dilated common bile duct, up to 8 mm in diameter on 12/04/2016.  No definite stone is identified in the common bile duct, with understanding that there is some degradation of negative predictive value due to motion artifact. Acute cholecystitis is not excluded. 2. Trace perihepatic ascites adjacent to the right hepatic lobe inferiorly. 3. No abnormal enhancement along the biliary tree to suggest cholangitis. 4. Thickened distal small bowel loops, including the terminal ileum, potentially from infectious enteritis or inflammatory bowel disease. 5. Right renal scarring and resulting atrophy. Electronically Signed   By: Van Clines M.D.   On: 05/10/2018 09:33   US Abdomen Limited Ruq  Result Date: 05/09/2018 CLINICAL DATA:  Right upper quadrant abdominal pain for 1 week with nausea, vomiting and diarrhea. EXAM: ULTRASOUND ABDOMEN LIMITED RIGHT UPPER QUADRANT COMPARISON:  CT 05/08/2018. FINDINGS: Gallbladder: Multiple gallstones are again noted. The gallbladder is better distended and demonstrates no wall thickening. However, the patient is reported to have a sonographic Murphy's sign by the sonographer. Common bile duct: Diameter: 7 mm.  Less dilated than yesterday's CT. Liver: No focal lesion identified. Within normal limits in parenchymal echogenicity. Portal vein is patent on color Doppler imaging with normal direction of blood flow towards the liver. IMPRESSION: 1. Cholelithiasis without objective signs of cholecystitis. The patient is reported to have a sonographic Murphy sign, however. 2. Mild biliary dilatation, improved from recent CT. Electronically Signed   By: Richardean Sale M.D.   On: 05/09/2018 10:17    Impression: 47 year old female admitted with acute cholecystitis and plans for laparoscopic cholecystectomy today with Dr. Constance Haw. LFTs have remained normal, and although MRCP was  with motion artifact, no obvious choledocholithiasis. GI consulted due to enteritis seen on CT involving jejunum to TI. Although she notes sick contacts (children) a month ago,  she also had multiple presentations to the ED in June 2019 with N/V/D and CT findings of enteritis involving mid and distal small bowel, including the TI. No prior GI care, no chronic history of diarrhea, rectal bleeding, or family history of IBD. No prior colonoscopy/EGD.  Cholecystectomy today. Will follow-up with patient tomorrow morning. She would benefit from further outpatient evaluation once recovered from cholecystectomy. Colonoscopy with biopsies of TI can be discussed as outpatient. Monitor for persistent diarrhea.   Plan: Cholecystectomy today Will follow while inpatient Monitor for persistent diarrhea Ileocolonoscopy +/- EGD as outpatient Avoid NSAIDs   Annitta Needs, PhD, ANP-BC [facility]    LOS: 2 days    05/11/2018, 10:09 AM

## 2018-05-12 ENCOUNTER — Telehealth: Payer: Self-pay | Admitting: Gastroenterology

## 2018-05-12 ENCOUNTER — Encounter (HOSPITAL_COMMUNITY): Payer: Self-pay | Admitting: General Surgery

## 2018-05-12 DIAGNOSIS — G894 Chronic pain syndrome: Secondary | ICD-10-CM

## 2018-05-12 DIAGNOSIS — F17210 Nicotine dependence, cigarettes, uncomplicated: Secondary | ICD-10-CM

## 2018-05-12 LAB — CBC
HCT: 42 % (ref 36.0–46.0)
Hemoglobin: 13.8 g/dL (ref 12.0–15.0)
MCH: 30.1 pg (ref 26.0–34.0)
MCHC: 32.9 g/dL (ref 30.0–36.0)
MCV: 91.7 fL (ref 78.0–100.0)
PLATELETS: 179 10*3/uL (ref 150–400)
RBC: 4.58 MIL/uL (ref 3.87–5.11)
RDW: 12.6 % (ref 11.5–15.5)
WBC: 11.3 10*3/uL — ABNORMAL HIGH (ref 4.0–10.5)

## 2018-05-12 LAB — BASIC METABOLIC PANEL
Anion gap: 8 (ref 5–15)
BUN: <5 mg/dL — ABNORMAL LOW (ref 6–20)
CALCIUM: 8.4 mg/dL — AB (ref 8.9–10.3)
CO2: 28 mmol/L (ref 22–32)
CREATININE: 0.65 mg/dL (ref 0.44–1.00)
Chloride: 100 mmol/L (ref 98–111)
GFR calc non Af Amer: >60 mL/min (ref >60)
Glucose, Bld: 101 mg/dL — ABNORMAL HIGH (ref 70–99)
Potassium: 3.8 mmol/L (ref 3.5–5.1)
Sodium: 136 mmol/L (ref 135–145)

## 2018-05-12 MED ORDER — PANTOPRAZOLE SODIUM 40 MG PO TBEC
40.0000 mg | DELAYED_RELEASE_TABLET | Freq: Every day | ORAL | Status: DC
  Administered 2018-05-12: 40 mg via ORAL
  Filled 2018-05-12: qty 1

## 2018-05-12 MED ORDER — OXYCODONE HCL 5 MG PO TABS: 5.0000 mg | ORAL_TABLET | ORAL | 0 refills | Status: DC | PRN

## 2018-05-12 MED ORDER — CYCLOBENZAPRINE HCL 5 MG PO TABS: 5.0000 mg | ORAL_TABLET | Freq: Three times a day (TID) | ORAL | 0 refills | Status: DC | PRN

## 2018-05-12 MED ORDER — ONDANSETRON 4 MG PO TBDP: 4.0000 mg | ORAL_TABLET | Freq: Three times a day (TID) | ORAL | 0 refills | Status: DC | PRN

## 2018-05-12 MED ORDER — OMEPRAZOLE 40 MG PO CPDR: 40.0000 mg | DELAYED_RELEASE_CAPSULE | Freq: Every day | ORAL | 1 refills | Status: DC

## 2018-05-12 NOTE — Discharge Summary (Signed)
Physician Discharge Summary  [name] VCB:[id number] DOB: September 05, 1971 DOA: 05/09/2018  PCP: Patient, No Pcp Per  Admit date: 05/09/2018 Discharge date: 05/12/2018  Time spent: 45 minutes, this time is independent from time spent for  smoking cessation counseling  Recommendations for Outpatient Follow-up:  -Will be discharged home today. -Advised to follow up with Dr. Constance Haw in 2 weeks for continued post-op care.   Discharge Diagnoses:  Active Problems:   Acute cholecystitis   Symptomatic cholelithiasis   Chronic pain syndrome   Calculus of gallbladder with acute cholecystitis without obstruction   Enteritis   Discharge Condition: Stable and improved  Filed Weights   05/09/18 0840 05/09/18 1529  Weight: 83.9 kg (185 lb) 87.1 kg (192 lb 0.3 oz)    History of present illness:  As per Dr. Carles Collet on 8/4: [name] is a 47 y.o. female with medical history of chronic pain syndrome, tobacco abuse, fibromyalgia presenting with 1 week history of generalized abdominal pain with associated nausea and vomiting.  The patient complained of low-grade temperature up to 100.0 F at home.  She denies any recent sick contacts.  The patient also developed loose stools, but this has resolved within the last 48 hours.  She denies any hematochezia, melena, hematemesis.  The patient denies any new medications.  There is no dysuria, hematuria, chest pain, shortness breath, coughing, hemoptysis.  The patient visited the emergency department on 05/07/2018.  She was treated symptomatically and released home.  She returned on 06/04/2018.  CT of the abdomen and pelvis at that time showed mild intrahepatic ductal dilatation with common bile duct of 10 mm.  There was also gallbladder wall thickening with a small amount of pericholecystic fluid.  There was also bowel wall thickening involving the jejunum to the terminal ileum.  The patient was offered admission, but because of childcare issues, the patient did not  want to stay in the hospital.  She returned the morning of 05/09/2018 because of continued vomiting and abdominal pain.  WBC was noted to be 12.9.  BMP was essentially unremarkable with potassium 3.4.  Abdominal ultrasound was obtained and showed cholelithiasis with positive Murphy sign but without any gallbladder wall thickening.  Common bile duct was noted to be 7 mm.    Hospital Course:   Acute cholecystitis/symptomatic cholelithiasis -No evidence for choledocholithiasis on MRCP. -Was on meropenem, general surgery was consulted, on 8/6 had a laparoscopic cholecystectomy that was without complications. -Will be discharged home today, advised follow-up with surgery in 2 weeks. -5-day postop course of oxycodone has been prescribed.  Would be careful with continued narcotic prescriptions given her history of chronic pain and active Suboxone prescription.  Possible enteritis -She has no further diarrhea, abdominal pain is improved, tolerating diet, no need for further treatment or antibiotics.  Tobacco abuse -I have discussed tobacco cessation with the patient.  I have counseled the patient regarding the negative impacts of continued tobacco use including but not limited to lung cancer, COPD, and cardiovascular disease.  I have discussed alternatives to tobacco and modalities that may help facilitate tobacco cessation including but not limited to biofeedback, hypnosis, and medications.  Total time spent with tobacco counseling was 4 minutes    Procedures:  Laparoscopic cholecystectomy on August 6  Consultations:  General surgery  Discharge Instructions  Discharge Instructions    Increase activity slowly   Complete by:  As directed      Allergies as of 05/12/2018      Reactions  Aspirin Shortness Of Breath   Nsaids Shortness Of Breath   Latex Swelling   Morphine And Related Itching   Phenergan [promethazine] Other (See Comments)   Somnolence    Penicillins Rash, Other (See  Comments)   Pt states that she is allergic to all -cillins.  Has patient had a PCN reaction causing immediate rash, facial/tongue/throat swelling, SOB or lightheadedness with hypotension: No Has patient had a PCN reaction causing severe rash involving mucus membranes or skin necrosis: No Has patient had a PCN reaction that required hospitalization: No Has patient had a PCN reaction occurring within the last 10 years: No If all of the above answers are "NO", then may proceed with Cephalosporin use.   Toradol [ketorolac Tromethamine] Nausea And Vomiting      Medication List    STOP taking these medications   calcium carbonate 500 MG chewable tablet Commonly known as:  TUMS - dosed in mg elemental calcium   ciprofloxacin 500 MG/5ML (10%) suspension Commonly known as:  CIPRO   dicyclomine 20 MG tablet Commonly known as:  BENTYL   HYDROcodone-acetaminophen 7.5-325 mg/15 ml solution Commonly known as:  HYCET   metoCLOPramide 10 MG tablet Commonly known as:  REGLAN   metroNIDAZOLE 50 mg/ml oral suspension Commonly known as:  FLAGYL   ondansetron 4 MG tablet Commonly known as:  ZOFRAN   oxyCODONE-acetaminophen 5-325 MG tablet Commonly known as:  PERCOCET/ROXICET     TAKE these medications   cyclobenzaprine 5 MG tablet Commonly known as:  FLEXERIL Take 1 tablet (5 mg total) by mouth every 8 (eight) hours as needed for muscle spasms.   omeprazole 40 MG capsule Commonly known as:  PRILOSEC Take 1 capsule (40 mg total) by mouth daily.   ondansetron 4 MG disintegrating tablet Commonly known as:  ZOFRAN ODT Take 1 tablet (4 mg total) by mouth every 8 (eight) hours as needed for nausea or vomiting. What changed:  reasons to take this   oxyCODONE 5 MG immediate release tablet Commonly known as:  Oxy IR/ROXICODONE Take 1-2 tablets (5-10 mg total) by mouth every 4 (four) hours as needed for moderate pain.   SUBOXONE 8-2 MG Film Generic drug:  Buprenorphine HCl-Naloxone  HCl Place 2 Film under the tongue daily.      Allergies  Allergen Reactions  . Aspirin Shortness Of Breath  . Nsaids Shortness Of Breath  . Latex Swelling  . Morphine And Related Itching  . Phenergan [Promethazine] Other (See Comments)    Somnolence   . Penicillins Rash and Other (See Comments)    Pt states that she is allergic to all -cillins.  Has patient had a PCN reaction causing immediate rash, facial/tongue/throat swelling, SOB or lightheadedness with hypotension: No Has patient had a PCN reaction causing severe rash involving mucus membranes or skin necrosis: No Has patient had a PCN reaction that required hospitalization: No Has patient had a PCN reaction occurring within the last 10 years: No If all of the above answers are "NO", then may proceed with Cephalosporin use.  . Toradol [Ketorolac Tromethamine] Nausea And Vomiting   Follow-up Information    Virl Cagey, MD In 2 weeks.   Specialty:  General Surgery Contact information: [address] [place] [facility] [address] [phone]            The results of significant diagnostics from this hospitalization (including imaging, microbiology, ancillary and laboratory) are listed below for reference.    Significant Diagnostic Studies: Ct Abdomen Pelvis W Contrast  Result  Date: 05/08/2018 CLINICAL DATA:  Per ED notes: Patient c/o upper abd pain/spasms x2 days. Patient states nausea, vomiting, diarrhea, and intermittent fevers. Denies any urinary symptoms EXAM: CT ABDOMEN AND PELVIS WITH CONTRAST TECHNIQUE: Multidetector CT imaging of the abdomen and pelvis was performed using the standard protocol following bolus administration of intravenous contrast. CONTRAST:  112mL ISOVUE-300 IOPAMIDOL (ISOVUE-300) INJECTION 61% COMPARISON:  CT of the abdomen and pelvis on 03/16/2018 FINDINGS: Lower chest: No pulmonary nodules, pleural effusions, or infiltrates. Heart size is normal. Hepatobiliary: No pericardial effusion or  significant coronary artery calcifications. The there is mild intrahepatic biliary duct dilatation. Common bile duct is 10 millimeters. Gallbladder appears contracted. Gallbladder wall is thickened or there is small amount of pericholecystic fluid. Pancreas: There is increased, mild dilatation of the pancreatic duct. Spleen: Normal in size without focal abnormality. Adrenals/Urinary Tract: Adrenal glands are normal in appearance. The RIGHT kidney is diminutive and there are linear areas of scarring throughout the kidney. No hydronephrosis. The LEFT kidney is normal in appearance. No suspicious renal mass. The urinary bladder is decompressed. Stomach/Bowel: The stomach is normal in appearance. There is bowel wall thickening and inflammation involving the majority of the small bowel, beginning with the jejunum and extending to the terminal ileum. The findings are diffuse and symmetric. There are small collections of mesenteric fluid. The colon is unremarkable. There is a small amount of fluid surrounding the appendix but this appears to be a secondary process. The appendix is normal in diameter. Vascular/Lymphatic: No significant vascular findings are present. No enlarged abdominal or pelvic lymph nodes. Reproductive: Uterus is present.  No adnexal mass. Other: There is a small amount of free pelvic fluid, increased since prior study. Musculoskeletal: No acute or significant osseous findings. IMPRESSION: 1. Significant small bowel abnormality without change in distribution. Findings are consistent with enteritis of the mid and distal small bowel. No evidence for perforation or abscess. 2. Increased ascites. 3. Increased intrahepatic and pancreatic duct dilatation. 4. Question of gallbladder wall thickening versus pericholecystic fluid. Consider further evaluation with RIGHT UPPER QUADRANT ultrasound. Electronically Signed   By: Nolon Nations M.D.   On: 05/08/2018 18:01   Mr 3d Recon At Scanner  Result Date:  05/10/2018 CLINICAL DATA:  Abdominal pain with swelling and nausea. Intrahepatic and pancreatic duct dilatation with cholelithiasis along with small bowel wall thickening. EXAM: MRI ABDOMEN WITHOUT AND WITH CONTRAST (INCLUDING MRCP) TECHNIQUE: Multiplanar multisequence MR imaging of the abdomen was performed both before and after the administration of intravenous contrast. Heavily T2-weighted images of the biliary and pancreatic ducts were obtained, and three-dimensional MRCP images were rendered by post processing. CONTRAST:  26mL MULTIHANCE GADOBENATE DIMEGLUMINE 529 MG/ML IV SOLN COMPARISON:  Multiple exams, including 05/08/2018 CT scan and ultrasound from 05/09/2018 FINDINGS: Despite efforts by the technologist and patient, motion artifact is present on today's exam and could not be eliminated. This reduces exam sensitivity and specificity. Lower chest: Unremarkable Hepatobiliary: Multiple filling defects in the gallbladder are present measuring up to 0.9 cm in long axis. Some of these are non dependent. There is dilatation of the common bile duct at 0.9 cm with distal conical tapering and without appreciable distal filling defect in the common bile duct, subject to the limitations of motion artifact. Minimal intrahepatic biliary dilatation. No irregularity of the biliary system or definite abnormal enhancement along the biliary walls. No pericholecystic fluid. There is an approximately 3 mm T2 hyperintense lesion laterally in the right hepatic lobe on image 20/5 without appreciable enhancement, probably  a tiny cyst but technically too small to characterize. No worrisome abnormal enhancement in the liver. Pancreas: Currently no significant dorsal pancreatic duct dilatation. No abnormal pancreatic enhancement. Spleen:  Unremarkable Adrenals/Urinary Tract: Right renal atrophy and scarring. Adrenal glands normal. Stomach/Bowel: On the coronal images, some of the pelvic loops of thickened distal small bowel are  identified, likely to include the terminal ileum. Some of these loops are borderline dilated. Vascular/Lymphatic:  Unremarkable Other: Trace perihepatic ascites posteriorly on images 24-27 of series 13. Musculoskeletal: Unremarkable IMPRESSION: 1. Cholelithiasis with dilated common bile duct up to 9 mm in diameter, without current dilatation of the dorsal pancreatic duct. Note that the patient has previously also had dilated common bile duct, up to 8 mm in diameter on 12/04/2016. No definite stone is identified in the common bile duct, with understanding that there is some degradation of negative predictive value due to motion artifact. Acute cholecystitis is not excluded. 2. Trace perihepatic ascites adjacent to the right hepatic lobe inferiorly. 3. No abnormal enhancement along the biliary tree to suggest cholangitis. 4. Thickened distal small bowel loops, including the terminal ileum, potentially from infectious enteritis or inflammatory bowel disease. 5. Right renal scarring and resulting atrophy. Electronically Signed   By: Van Clines M.D.   On: 05/10/2018 09:33   Dg Chest Port 1 View  Result Date: 05/10/2018 CLINICAL DATA:  Preop EXAM: PORTABLE CHEST 1 VIEW COMPARISON:  02/11/2018 FINDINGS: Normal heart size. Lungs clear. No pneumothorax. No pleural effusion. IMPRESSION: No active disease. Electronically Signed   By: Marybelle Killings M.D.   On: 05/10/2018 18:59   Mr Abdomen Mrcp Moise Boring Contast  Result Date: 05/10/2018 CLINICAL DATA:  Abdominal pain with swelling and nausea. Intrahepatic and pancreatic duct dilatation with cholelithiasis along with small bowel wall thickening. EXAM: MRI ABDOMEN WITHOUT AND WITH CONTRAST (INCLUDING MRCP) TECHNIQUE: Multiplanar multisequence MR imaging of the abdomen was performed both before and after the administration of intravenous contrast. Heavily T2-weighted images of the biliary and pancreatic ducts were obtained, and three-dimensional MRCP images were rendered by  post processing. CONTRAST:  63mL MULTIHANCE GADOBENATE DIMEGLUMINE 529 MG/ML IV SOLN COMPARISON:  Multiple exams, including 05/08/2018 CT scan and ultrasound from 05/09/2018 FINDINGS: Despite efforts by the technologist and patient, motion artifact is present on today's exam and could not be eliminated. This reduces exam sensitivity and specificity. Lower chest: Unremarkable Hepatobiliary: Multiple filling defects in the gallbladder are present measuring up to 0.9 cm in long axis. Some of these are non dependent. There is dilatation of the common bile duct at 0.9 cm with distal conical tapering and without appreciable distal filling defect in the common bile duct, subject to the limitations of motion artifact. Minimal intrahepatic biliary dilatation. No irregularity of the biliary system or definite abnormal enhancement along the biliary walls. No pericholecystic fluid. There is an approximately 3 mm T2 hyperintense lesion laterally in the right hepatic lobe on image 20/5 without appreciable enhancement, probably a tiny cyst but technically too small to characterize. No worrisome abnormal enhancement in the liver. Pancreas: Currently no significant dorsal pancreatic duct dilatation. No abnormal pancreatic enhancement. Spleen:  Unremarkable Adrenals/Urinary Tract: Right renal atrophy and scarring. Adrenal glands normal. Stomach/Bowel: On the coronal images, some of the pelvic loops of thickened distal small bowel are identified, likely to include the terminal ileum. Some of these loops are borderline dilated. Vascular/Lymphatic:  Unremarkable Other: Trace perihepatic ascites posteriorly on images 24-27 of series 13. Musculoskeletal: Unremarkable IMPRESSION: 1. Cholelithiasis with dilated common bile  duct up to 9 mm in diameter, without current dilatation of the dorsal pancreatic duct. Note that the patient has previously also had dilated common bile duct, up to 8 mm in diameter on 12/04/2016. No definite stone is  identified in the common bile duct, with understanding that there is some degradation of negative predictive value due to motion artifact. Acute cholecystitis is not excluded. 2. Trace perihepatic ascites adjacent to the right hepatic lobe inferiorly. 3. No abnormal enhancement along the biliary tree to suggest cholangitis. 4. Thickened distal small bowel loops, including the terminal ileum, potentially from infectious enteritis or inflammatory bowel disease. 5. Right renal scarring and resulting atrophy. Electronically Signed   By: Van Clines M.D.   On: 05/10/2018 09:33   US Abdomen Limited Ruq  Result Date: 05/09/2018 CLINICAL DATA:  Right upper quadrant abdominal pain for 1 week with nausea, vomiting and diarrhea. EXAM: ULTRASOUND ABDOMEN LIMITED RIGHT UPPER QUADRANT COMPARISON:  CT 05/08/2018. FINDINGS: Gallbladder: Multiple gallstones are again noted. The gallbladder is better distended and demonstrates no wall thickening. However, the patient is reported to have a sonographic Murphy's sign by the sonographer. Common bile duct: Diameter: 7 mm.  Less dilated than yesterday's CT. Liver: No focal lesion identified. Within normal limits in parenchymal echogenicity. Portal vein is patent on color Doppler imaging with normal direction of blood flow towards the liver. IMPRESSION: 1. Cholelithiasis without objective signs of cholecystitis. The patient is reported to have a sonographic Murphy sign, however. 2. Mild biliary dilatation, improved from recent CT. Electronically Signed   By: Richardean Sale M.D.   On: 05/09/2018 10:17    Microbiology: Recent Results (from the past 240 hour(s))  Surgical pcr screen     Status: None   Collection Time: 05/10/18  8:45 PM  Result Value Ref Range Status   MRSA, PCR NEGATIVE NEGATIVE Final   Staphylococcus aureus NEGATIVE NEGATIVE Final    Comment: (NOTE) The Xpert SA Assay (FDA approved for NASAL specimens in patients 30 years of age and older), is one  component of a comprehensive surveillance program. It is not intended to diagnose infection nor to guide or monitor treatment. Performed at [facility], [address] [place], [place] [address]      Labs: Basic Metabolic Panel: Recent Labs  Lab 05/08/18 1628 05/09/18 0945 05/10/18 0410 05/11/18 0505 05/12/18 0431  NA 138 137 137 136 136  K 3.4* 3.4* 3.5 3.5 3.8  CL 105 106 107 102 100  CO2 27 25 25 28 28   GLUCOSE 136* 124* 80 82 101*  BUN 7 7 5* <5* <5*  CREATININE 0.75 0.68 0.65 0.71 0.65  CALCIUM 8.8* 8.5* 8.2* 8.4* 8.4*  MG  --   --  1.8  --   --    Liver Function Tests: Recent Labs  Lab 05/07/18 1045 05/08/18 1628 05/09/18 0945 05/10/18 0410 05/11/18 0505  AST 12* 11* 10* 9* 8*  ALT 11 10 11 8 9   ALKPHOS 66 64 61 60 66  BILITOT 1.0 1.1 0.9 0.8 0.8  PROT 6.3* 6.6 6.6 6.1* 6.5  ALBUMIN 3.4* 3.5 3.6 3.3* 3.3*   Recent Labs  Lab 05/07/18 1045 05/08/18 1628 05/09/18 0945  LIPASE 33 36 33   No results for input(s): AMMONIA in the last 168 hours. CBC: Recent Labs  Lab 05/07/18 1045 05/08/18 1628 05/09/18 0945 05/10/18 0410 05/11/18 0505 05/12/18 0431  WBC 11.4* 8.4 12.9* 12.3* 14.6* 11.3*  NEUTROABS 9.3* 6.0 10.8*  --   --   --  HGB 14.6 14.0 13.9 13.1 14.1 13.8  HCT 43.7 42.2 41.3 40.8 42.8 42.0  MCV 90.3 90.2 90.0 91.7 90.9 91.7  PLT 186 178 192 174 189 179   Cardiac Enzymes: No results for input(s): CKTOTAL, CKMB, CKMBINDEX, TROPONINI in the last 168 hours. BNP: BNP (last 3 results) No results for input(s): BNP in the last 8760 hours.  ProBNP (last 3 results) No results for input(s): PROBNP in the last 8760 hours.  CBG: No results for input(s): GLUCAP in the last 168 hours.     Signed:  Lelon Frohlich  [facility] Pager: [phone] 05/12/2018, 12:24 PM

## 2018-05-12 NOTE — Progress Notes (Signed)
[facility] Progress Note  1 Day Post-Op  Subjective: Had some muscle spasm overnight. Ordered flexeril. [name] having some pain today but ambulating in room and tolerating jello this AM. No further diarrhea.   Objective: Vital signs in last 24 hours: Temp:  [97.6 F (36.4 C)-99.5 F (37.5 C)] 98.2 F (36.8 C) (08/07 0852) Pulse Rate:  [63-77] 77 (08/07 0852) Resp:  [7-22] 18 (08/07 0852) BP: (124-168)/(65-92) 154/77 (08/07 0852) SpO2:  [90 %-100 %] 97 % (08/07 0852) Last BM Date: 05/11/18  Intake/Output from previous day: 08/06 0701 - 08/07 0700 In: 620 [P.O.:120; I.V.:500] Out: 600 [Urine:600] Intake/Output this shift: No intake/output data recorded.  General appearance: alert, cooperative and no distress Resp: normal work breathing GI: soft, mildly distended, appropriately tender, ports c/d/i with dermabond.   Lab Results:  Recent Labs    05/11/18 0505 05/12/18 0431  WBC 14.6* 11.3*  HGB 14.1 13.8  HCT 42.8 42.0  PLT 189 179   BMET Recent Labs    05/11/18 0505 05/12/18 0431  NA 136 136  K 3.5 3.8  CL 102 100  CO2 28 28  GLUCOSE 82 101*  BUN <5* <5*  CREATININE 0.71 0.65  CALCIUM 8.4* 8.4*     Studies/Results: Dg Chest Port 1 View  Result Date: 05/10/2018 CLINICAL DATA:  Preop EXAM: PORTABLE CHEST 1 VIEW COMPARISON:  02/11/2018 FINDINGS: Normal heart size. Lungs clear. No pneumothorax. No pleural effusion. IMPRESSION: No active disease. Electronically Signed   By: Marybelle Killings M.D.   On: 05/10/2018 18:59    Anti-infectives: Anti-infectives (From admission, onward)   Start     Dose/Rate Route Frequency Ordered Stop   05/11/18 0600  ciprofloxacin (CIPRO) IVPB 400 mg     400 mg 200 mL/hr over 60 Minutes Intravenous On call to O.R. 05/10/18 1838 05/11/18 1215   05/09/18 1600  meropenem (MERREM) 1 g in sodium chloride 0.9 % 100 mL IVPB  Status:  Discontinued     1 g 200 mL/hr over 30 Minutes Intravenous Every 8 hours 05/09/18 1523  05/11/18 1711      Assessment/Plan: [name] is a 47 yo POD 1 s/p lap cholecystectomy for cholecystitis. Doing fair. Some pain and muscle spasm but improved.  Ambulating and tolerating diet. D/c home from surgical standpoint Will need Rx for pain meds, PPI and flexeril Discussed with Dr. Jerilee Hoh. Follow up in 2 weeks.    LOS: 3 days    [name] 05/12/2018

## 2018-05-12 NOTE — Telephone Encounter (Signed)
Please have patient follow-up in 2 weeks to arrange colonoscopy.

## 2018-05-12 NOTE — Progress Notes (Signed)
Pt is upset and asking why she did not get her zofran on time this AM. I told her that she did not call for it. She said she did not know she needed to call for it and that if she needs to keep up with her pain, nausea and muscle spasm meds, she needs a pencil and a paper to keeps track.   I told her I would give her a piece of paper and pencil.

## 2018-05-12 NOTE — Progress Notes (Addendum)
Subjective: Had a difficult night with pain control but now better. Now with routine post-op incisional discomfort. No diarrhea. Does not want to pursue colonoscopy as inpatient. Understands the need for this in the near future. Reports significant reflux symptoms.   Objective: Vital signs in last 24 hours: Temp:  [97.6 F (36.4 C)-99.5 F (37.5 C)] 98.9 F (37.2 C) (08/07 0409) Pulse Rate:  [63-75] 71 (08/07 0409) Resp:  [7-25] 18 (08/07 0409) BP: (124-168)/(65-92) 124/65 (08/07 0409) SpO2:  [90 %-100 %] 98 % (08/07 0409) Last BM Date: 05/11/18 General:   Alert and oriented, pleasant Abdomen:  Bowel sounds present, soft, mild TTP diffusely, no rebound or guarding, incisions C/D/I Neurologic:  Alert and  oriented x4 Psych:  Alert and cooperative. Normal mood and affect.  Intake/Output from previous day: 08/06 0701 - 08/07 0700 In: 620 [P.O.:120; I.V.:500] Out: 600 [Urine:600] Intake/Output this shift: No intake/output data recorded.  Lab Results: Recent Labs    05/10/18 0410 05/11/18 0505 05/12/18 0431  WBC 12.3* 14.6* 11.3*  HGB 13.1 14.1 13.8  HCT 40.8 42.8 42.0  PLT 174 189 179   BMET Recent Labs    05/10/18 0410 05/11/18 0505 05/12/18 0431  NA 137 136 136  K 3.5 3.5 3.8  CL 107 102 100  CO2 25 28 28   GLUCOSE 80 82 101*  BUN 5* <5* <5*  CREATININE 0.65 0.71 0.65  CALCIUM 8.2* 8.4* 8.4*   LFT Recent Labs    05/09/18 0945 05/10/18 0410 05/11/18 0505  PROT 6.6 6.1* 6.5  ALBUMIN 3.6 3.3* 3.3*  AST 10* 9* 8*  ALT 11 8 9   ALKPHOS 61 60 66  BILITOT 0.9 0.8 0.8    Studies/Results: Mr 3d Recon At Scanner  Result Date: 05/10/2018 CLINICAL DATA:  Abdominal pain with swelling and nausea. Intrahepatic and pancreatic duct dilatation with cholelithiasis along with small bowel wall thickening. EXAM: MRI ABDOMEN WITHOUT AND WITH CONTRAST (INCLUDING MRCP) TECHNIQUE: Multiplanar multisequence MR imaging of the abdomen was performed both before and after the  administration of intravenous contrast. Heavily T2-weighted images of the biliary and pancreatic ducts were obtained, and three-dimensional MRCP images were rendered by post processing. CONTRAST:  96mL MULTIHANCE GADOBENATE DIMEGLUMINE 529 MG/ML IV SOLN COMPARISON:  Multiple exams, including 05/08/2018 CT scan and ultrasound from 05/09/2018 FINDINGS: Despite efforts by the technologist and patient, motion artifact is present on today's exam and could not be eliminated. This reduces exam sensitivity and specificity. Lower chest: Unremarkable Hepatobiliary: Multiple filling defects in the gallbladder are present measuring up to 0.9 cm in long axis. Some of these are non dependent. There is dilatation of the common bile duct at 0.9 cm with distal conical tapering and without appreciable distal filling defect in the common bile duct, subject to the limitations of motion artifact. Minimal intrahepatic biliary dilatation. No irregularity of the biliary system or definite abnormal enhancement along the biliary walls. No pericholecystic fluid. There is an approximately 3 mm T2 hyperintense lesion laterally in the right hepatic lobe on image 20/5 without appreciable enhancement, probably a tiny cyst but technically too small to characterize. No worrisome abnormal enhancement in the liver. Pancreas: Currently no significant dorsal pancreatic duct dilatation. No abnormal pancreatic enhancement. Spleen:  Unremarkable Adrenals/Urinary Tract: Right renal atrophy and scarring. Adrenal glands normal. Stomach/Bowel: On the coronal images, some of the pelvic loops of thickened distal small bowel are identified, likely to include the terminal ileum. Some of these loops are borderline dilated. Vascular/Lymphatic:  Unremarkable Other:  Trace perihepatic ascites posteriorly on images 24-27 of series 13. Musculoskeletal: Unremarkable IMPRESSION: 1. Cholelithiasis with dilated common bile duct up to 9 mm in diameter, without current  dilatation of the dorsal pancreatic duct. Note that the patient has previously also had dilated common bile duct, up to 8 mm in diameter on 12/04/2016. No definite stone is identified in the common bile duct, with understanding that there is some degradation of negative predictive value due to motion artifact. Acute cholecystitis is not excluded. 2. Trace perihepatic ascites adjacent to the right hepatic lobe inferiorly. 3. No abnormal enhancement along the biliary tree to suggest cholangitis. 4. Thickened distal small bowel loops, including the terminal ileum, potentially from infectious enteritis or inflammatory bowel disease. 5. Right renal scarring and resulting atrophy. Electronically Signed   By: Van Clines M.D.   On: 05/10/2018 09:33   Dg Chest Port 1 View  Result Date: 05/10/2018 CLINICAL DATA:  Preop EXAM: PORTABLE CHEST 1 VIEW COMPARISON:  02/11/2018 FINDINGS: Normal heart size. Lungs clear. No pneumothorax. No pleural effusion. IMPRESSION: No active disease. Electronically Signed   By: Marybelle Killings M.D.   On: 05/10/2018 18:59   Mr Abdomen Mrcp Moise Boring Contast  Result Date: 05/10/2018 CLINICAL DATA:  Abdominal pain with swelling and nausea. Intrahepatic and pancreatic duct dilatation with cholelithiasis along with small bowel wall thickening. EXAM: MRI ABDOMEN WITHOUT AND WITH CONTRAST (INCLUDING MRCP) TECHNIQUE: Multiplanar multisequence MR imaging of the abdomen was performed both before and after the administration of intravenous contrast. Heavily T2-weighted images of the biliary and pancreatic ducts were obtained, and three-dimensional MRCP images were rendered by post processing. CONTRAST:  77mL MULTIHANCE GADOBENATE DIMEGLUMINE 529 MG/ML IV SOLN COMPARISON:  Multiple exams, including 05/08/2018 CT scan and ultrasound from 05/09/2018 FINDINGS: Despite efforts by the technologist and patient, motion artifact is present on today's exam and could not be eliminated. This reduces exam  sensitivity and specificity. Lower chest: Unremarkable Hepatobiliary: Multiple filling defects in the gallbladder are present measuring up to 0.9 cm in long axis. Some of these are non dependent. There is dilatation of the common bile duct at 0.9 cm with distal conical tapering and without appreciable distal filling defect in the common bile duct, subject to the limitations of motion artifact. Minimal intrahepatic biliary dilatation. No irregularity of the biliary system or definite abnormal enhancement along the biliary walls. No pericholecystic fluid. There is an approximately 3 mm T2 hyperintense lesion laterally in the right hepatic lobe on image 20/5 without appreciable enhancement, probably a tiny cyst but technically too small to characterize. No worrisome abnormal enhancement in the liver. Pancreas: Currently no significant dorsal pancreatic duct dilatation. No abnormal pancreatic enhancement. Spleen:  Unremarkable Adrenals/Urinary Tract: Right renal atrophy and scarring. Adrenal glands normal. Stomach/Bowel: On the coronal images, some of the pelvic loops of thickened distal small bowel are identified, likely to include the terminal ileum. Some of these loops are borderline dilated. Vascular/Lymphatic:  Unremarkable Other: Trace perihepatic ascites posteriorly on images 24-27 of series 13. Musculoskeletal: Unremarkable IMPRESSION: 1. Cholelithiasis with dilated common bile duct up to 9 mm in diameter, without current dilatation of the dorsal pancreatic duct. Note that the patient has previously also had dilated common bile duct, up to 8 mm in diameter on 12/04/2016. No definite stone is identified in the common bile duct, with understanding that there is some degradation of negative predictive value due to motion artifact. Acute cholecystitis is not excluded. 2. Trace perihepatic ascites adjacent to the right hepatic lobe  inferiorly. 3. No abnormal enhancement along the biliary tree to suggest cholangitis.  4. Thickened distal small bowel loops, including the terminal ileum, potentially from infectious enteritis or inflammatory bowel disease. 5. Right renal scarring and resulting atrophy. Electronically Signed   By: Van Clines M.D.   On: 05/10/2018 09:33    Assessment: 47 year old female admitted with acute cholecystitis, s/p cholecystectomy yesterday. CT findings in June and Aug 2019 with ileitis and was reviewed by Dr. Oneida Alar with Dr. Thornton Papas. Concern for IBD. Will need diagnostic ileocolonoscopy in near future once recovered from cholecystectomy. She desires this as outpatient.   GERD: chronic history. Will add PPI.   Plan: Will follow peripherally for now Add PPI daily due to GERD  Will need diagnostic ileocolonoscopy in the near future with Propofol once recovered from cholecystectomy: patient desires as outpatient Will arrange follow-up appt and colonoscopy with Dr. Oneida Alar as outpatient   Annitta Needs, PhD, ANP-BC [facility]     LOS: 3 days    05/12/2018, 8:25 AM

## 2018-05-16 ENCOUNTER — Emergency Department (HOSPITAL_COMMUNITY)
Admission: EM | Admit: 2018-05-16 | Discharge: 2018-05-16 | Disposition: A | Discharge status: Home / Self Care | Payer: Medicare Other | Attending: Emergency Medicine | Admitting: Emergency Medicine

## 2018-05-16 ENCOUNTER — Encounter (HOSPITAL_COMMUNITY): Payer: Self-pay

## 2018-05-16 DIAGNOSIS — Z91040 Latex allergy status: Secondary | ICD-10-CM | POA: Insufficient documentation

## 2018-05-16 DIAGNOSIS — R197 Diarrhea, unspecified: Secondary | ICD-10-CM | POA: Diagnosis present

## 2018-05-16 DIAGNOSIS — I808 Phlebitis and thrombophlebitis of other sites: Secondary | ICD-10-CM

## 2018-05-16 DIAGNOSIS — I809 Phlebitis and thrombophlebitis of unspecified site: Secondary | ICD-10-CM | POA: Diagnosis not present

## 2018-05-16 DIAGNOSIS — F17210 Nicotine dependence, cigarettes, uncomplicated: Secondary | ICD-10-CM | POA: Diagnosis not present

## 2018-05-16 DIAGNOSIS — Z79899 Other long term (current) drug therapy: Secondary | ICD-10-CM | POA: Diagnosis not present

## 2018-05-16 LAB — CBC
HEMATOCRIT: 43.3 % (ref 36.0–46.0)
HEMOGLOBIN: 14.4 g/dL (ref 12.0–15.0)
MCH: 30.1 pg (ref 26.0–34.0)
MCHC: 33.3 g/dL (ref 30.0–36.0)
MCV: 90.4 fL (ref 78.0–100.0)
Platelets: 261 10*3/uL (ref 150–400)
RBC: 4.79 MIL/uL (ref 3.87–5.11)
RDW: 12.2 % (ref 11.5–15.5)
WBC: 12.3 10*3/uL — ABNORMAL HIGH (ref 4.0–10.5)

## 2018-05-16 LAB — COMPREHENSIVE METABOLIC PANEL
ALK PHOS: 68 U/L (ref 38–126)
ALT: 18 U/L (ref 0–44)
AST: 14 U/L — ABNORMAL LOW (ref 15–41)
Albumin: 3.5 g/dL (ref 3.5–5.0)
Anion gap: 10 (ref 5–15)
BILIRUBIN TOTAL: 0.4 mg/dL (ref 0.3–1.2)
BUN: 10 mg/dL (ref 6–20)
CALCIUM: 9.3 mg/dL (ref 8.9–10.3)
CO2: 31 mmol/L (ref 22–32)
Chloride: 100 mmol/L (ref 98–111)
Creatinine, Ser: 0.72 mg/dL (ref 0.44–1.00)
Glucose, Bld: 99 mg/dL (ref 70–99)
Potassium: 3.7 mmol/L (ref 3.5–5.1)
SODIUM: 141 mmol/L (ref 135–145)
Total Protein: 7.2 g/dL (ref 6.5–8.1)

## 2018-05-16 LAB — URINALYSIS, ROUTINE W REFLEX MICROSCOPIC
Bacteria, UA: NONE SEEN
Bilirubin Urine: NEGATIVE
Glucose, UA: NEGATIVE mg/dL
Ketones, ur: NEGATIVE mg/dL
Leukocytes, UA: NEGATIVE
NITRITE: NEGATIVE
Protein, ur: NEGATIVE mg/dL
Specific Gravity, Urine: 1.02 (ref 1.005–1.030)
pH: 6 (ref 5.0–8.0)

## 2018-05-16 LAB — PREGNANCY, URINE: PREG TEST UR: NEGATIVE

## 2018-05-16 LAB — LIPASE, BLOOD: Lipase: 48 U/L (ref 11–51)

## 2018-05-16 MED ORDER — CEPHALEXIN 500 MG PO CAPS
500.0000 mg | ORAL_CAPSULE | Freq: Once | ORAL | Status: AC
  Administered 2018-05-16: 500 mg via ORAL
  Filled 2018-05-16: qty 1

## 2018-05-16 MED ORDER — SODIUM CHLORIDE 0.9 % IV BOLUS
1000.0000 mL | Freq: Once | INTRAVENOUS | Status: AC
  Administered 2018-05-16: 1000 mL via INTRAVENOUS

## 2018-05-16 MED ORDER — ONDANSETRON HCL 4 MG/2ML IJ SOLN
4.0000 mg | Freq: Once | INTRAMUSCULAR | Status: AC
  Administered 2018-05-16: 4 mg via INTRAVENOUS
  Filled 2018-05-16: qty 2

## 2018-05-16 MED ORDER — CEPHALEXIN 500 MG PO CAPS: 500.0000 mg | ORAL_CAPSULE | Freq: Four times a day (QID) | ORAL | 0 refills | Status: DC

## 2018-05-16 NOTE — Discharge Instructions (Addendum)
It was our pleasure to provide your ER care today - we hope that you feel better.  Rest. Drink plenty of fluids.  For sore area to area, apply moist warm compresses to area several times per day for the next few days. Take antibiotic as prescribed. Take acetaminophen as need.   Follow up with primary care doctor in the next couple of days for recheck.  Return to ER if worse, new symptoms, fevers, persistent vomiting, new or severe abdominal pain, spreading redness, increased swelling to arm, other concern.

## 2018-05-16 NOTE — ED Triage Notes (Signed)
Pt reports that she recently had her gallbladder out and since she has been nauseated and vomit x2, but continuous diarrhea. Also reports vaginal bleeding. Also reports left arm redness where IV was located

## 2018-05-16 NOTE — ED Provider Notes (Signed)
[facility] EMERGENCY DEPARTMENT Provider Note   CSN: [id number] Arrival date & time: 05/16/18  1020     History   Chief Complaint Chief Complaint  Patient presents with  . Nausea  . Vaginal Bleeding    HPI [name] is a 47 y.o. female.  Patient is pod #5 from laparoscopic cholecystectomy, c/o multiple episodes of diarrhea (loose to watery, not bloody), and 2 episodes of emesis (clear/yellow, not bloody or bilious), and intermittent mid abd cramping. Symptoms episodic, persistent, felt worse today. Also notes mild tenderness and erythema to site LUE where had IV when in hospital. No fever or chills. Denies dysuria or gu c/o.   The history is provided by the patient.  Vaginal Bleeding  Primary symptoms include vaginal bleeding. Associated symptoms include diarrhea and vomiting.    Past Medical History:  Diagnosis Date  . Adjustment disorder   . Arthritis    hands, knees  . Endometrial polyp   . Fibromyalgia   . Headache(784.0)   . Pregnancy   . SVD (spontaneous vaginal delivery)    x 1    Patient Active Problem List   Diagnosis Date Noted  . Enteritis   . Acute cholecystitis 05/09/2018  . Symptomatic cholelithiasis 05/09/2018  . Chronic pain syndrome 05/09/2018  . Calculus of gallbladder with acute cholecystitis without obstruction   . Menorrhagia with regular cycle 03/19/2017  . Adjustment disorder with mixed anxiety and depressed mood 02/21/2015  . Dysmenorrhea 01/08/2015  . Dyspareunia 01/08/2015  . Uterus retroversion 01/08/2015  . Sterilization 06/25/2012    Past Surgical History:  Procedure Laterality Date  . CESAREAN SECTION     x 4  . CHOLECYSTECTOMY N/A 05/11/2018   Procedure: LAPAROSCOPIC CHOLECYSTECTOMY;  Surgeon: Virl Cagey, MD;  Location: [facility];  Service: General;  Laterality: N/A;  . right thumb surgery     pins  . WISDOM TOOTH EXTRACTION       OB History    Gravida  7   Para  6   Term  6   Preterm      AB  1   Living  6     SAB  1   TAB      Ectopic      Multiple      Live Births  1            Home Medications    Prior to Admission medications   Medication Sig Start Date End Date Taking? Authorizing Provider  cyclobenzaprine (FLEXERIL) 5 MG tablet Take 1 tablet (5 mg total) by mouth every 8 (eight) hours as needed for muscle spasms. 05/12/18   Isaac Bliss, Rayford Halsted, MD  omeprazole (PRILOSEC) 40 MG capsule Take 1 capsule (40 mg total) by mouth daily. 05/12/18   Isaac Bliss, Rayford Halsted, MD  ondansetron (ZOFRAN ODT) 4 MG disintegrating tablet Take 1 tablet (4 mg total) by mouth every 8 (eight) hours as needed for nausea or vomiting. 05/12/18   Isaac Bliss, Rayford Halsted, MD  oxyCODONE (OXY IR/ROXICODONE) 5 MG immediate release tablet Take 1-2 tablets (5-10 mg total) by mouth every 4 (four) hours as needed for moderate pain. 05/12/18   Erline Hau, MD  SUBOXONE 8-2 MG FILM Place 2 Film under the tongue daily. 05/01/18   [provider]    Family History Family History  Problem Relation Age of Onset  . Hypertension Mother   . Diabetes Mother   . Cancer Mother  lung  . CAD Father   . Heart failure Father   . Heart disease Father   . Seizures Son   . Diabetes Other   . Heart failure Other   . Hypertension Other   . Cancer Other   . Colon cancer Neg Hx   . Colon polyps Neg Hx   . Inflammatory bowel disease Neg Hx     Social History Social History   Tobacco Use  . Smoking status: Current Every Day Smoker    Packs/day: 0.50    Years: 10.00    Pack years: 5.00    Types: Cigarettes  . Smokeless tobacco: Never Used  . Tobacco comment: 5-6 cigarettes a day  Substance Use Topics  . Alcohol use: No    Alcohol/week: 0.0 standard drinks  . Drug use: No     Allergies   Aspirin; Nsaids; Latex; Morphine and related; Phenergan [promethazine]; Penicillins; and Toradol [ketorolac tromethamine]   Review of Systems Review of Systems    Constitutional: Negative for fever.  HENT: Negative for sore throat.   Eyes: Negative for redness.  Respiratory: Negative for cough and shortness of breath.   Cardiovascular: Negative for chest pain.  Gastrointestinal: Positive for diarrhea and vomiting.  Genitourinary: Positive for vaginal bleeding. Negative for flank pain.       Lnmp 1 month ago.   Musculoskeletal: Negative for back pain.  Skin: Negative for rash.  Neurological: Negative for headaches.  Hematological: Does not bruise/bleed easily.  Psychiatric/Behavioral: Negative for confusion.     Physical Exam Updated Vital Signs BP (!) 143/69 (BP Location: Right Arm)   Pulse 71   Temp 97.9 F (36.6 C) (Oral)   Resp 18   Ht 1.676 m (5\' 6" )   Wt 81.6 kg   LMP 05/11/2018 Comment: neg hcg 05/07/18  SpO2 98%   BMI 29.05 kg/m   Physical Exam  Constitutional: She appears well-developed and well-nourished.  HENT:  Mouth/Throat: Oropharynx is clear and moist.  Eyes: Conjunctivae are normal. No scleral icterus.  Neck: Neck supple. No tracheal deviation present.  Cardiovascular: Normal rate, regular rhythm, normal heart sounds and intact distal pulses. Exam reveals no gallop and no friction rub.  No murmur heard. Pulmonary/Chest: Effort normal and breath sounds normal. No respiratory distress.  Abdominal: Soft. Normal appearance and bowel sounds are normal. She exhibits no distension and no mass. There is no tenderness. There is no guarding.  Healing laparoscopic incisions without sign of infection  Genitourinary:  Genitourinary Comments: No cva tenderness  Musculoskeletal: She exhibits no edema.  Small, approx 2 cm by 4 cm, indurated, warm, erythematous area left forearm/antecub area c/w superficial thrombophlebitis, ?early superimposed cellulitis. No fluctuance or abscess.   Neurological: She is alert.  Skin: Skin is warm and dry. No rash noted.  Psychiatric: She has a normal mood and affect.  Nursing note and vitals  reviewed.    ED Treatments / Results  Labs (all labs ordered are listed, but only abnormal results are displayed) Results for orders placed or performed during the hospital encounter of 05/16/18  CBC  Result Value Ref Range   WBC 12.3 (H) 4.0 - 10.5 K/uL   RBC 4.79 3.87 - 5.11 MIL/uL   Hemoglobin 14.4 12.0 - 15.0 g/dL   HCT 43.3 36.0 - 46.0 %   MCV 90.4 78.0 - 100.0 fL   MCH 30.1 26.0 - 34.0 pg   MCHC 33.3 30.0 - 36.0 g/dL   RDW 12.2 11.5 - 15.5 %  Platelets 261 150 - 400 K/uL  Comprehensive metabolic panel  Result Value Ref Range   Sodium 141 135 - 145 mmol/L   Potassium 3.7 3.5 - 5.1 mmol/L   Chloride 100 98 - 111 mmol/L   CO2 31 22 - 32 mmol/L   Glucose, Bld 99 70 - 99 mg/dL   BUN 10 6 - 20 mg/dL   Creatinine, Ser 0.72 0.44 - 1.00 mg/dL   Calcium 9.3 8.9 - 10.3 mg/dL   Total Protein 7.2 6.5 - 8.1 g/dL   Albumin 3.5 3.5 - 5.0 g/dL   AST 14 (L) 15 - 41 U/L   ALT 18 0 - 44 U/L   Alkaline Phosphatase 68 38 - 126 U/L   Total Bilirubin 0.4 0.3 - 1.2 mg/dL   GFR calc non Af Amer >60 >60 mL/min   GFR calc Af Amer >60 >60 mL/min   Anion gap 10 5 - 15  Lipase, blood  Result Value Ref Range   Lipase 48 11 - 51 U/L  Urinalysis, Routine w reflex microscopic  Result Value Ref Range   Color, Urine YELLOW YELLOW   APPearance CLEAR CLEAR   Specific Gravity, Urine 1.020 1.005 - 1.030   pH 6.0 5.0 - 8.0   Glucose, UA NEGATIVE NEGATIVE mg/dL   Hgb urine dipstick MODERATE (A) NEGATIVE   Bilirubin Urine NEGATIVE NEGATIVE   Ketones, ur NEGATIVE NEGATIVE mg/dL   Protein, ur NEGATIVE NEGATIVE mg/dL   Nitrite NEGATIVE NEGATIVE   Leukocytes, UA NEGATIVE NEGATIVE   RBC / HPF 6-10 0 - 5 RBC/hpf   WBC, UA 0-5 0 - 5 WBC/hpf   Bacteria, UA NONE SEEN NONE SEEN   Squamous Epithelial / LPF 0-5 0 - 5   Mucus PRESENT   Pregnancy, urine  Result Value Ref Range   Preg Test, Ur NEGATIVE NEGATIVE   Ct Abdomen Pelvis W Contrast  Result Date: 05/08/2018 CLINICAL DATA:  Per ED notes: Patient  c/o upper abd pain/spasms x2 days. Patient states nausea, vomiting, diarrhea, and intermittent fevers. Denies any urinary symptoms EXAM: CT ABDOMEN AND PELVIS WITH CONTRAST TECHNIQUE: Multidetector CT imaging of the abdomen and pelvis was performed using the standard protocol following bolus administration of intravenous contrast. CONTRAST:  129mL ISOVUE-300 IOPAMIDOL (ISOVUE-300) INJECTION 61% COMPARISON:  CT of the abdomen and pelvis on 03/16/2018 FINDINGS: Lower chest: No pulmonary nodules, pleural effusions, or infiltrates. Heart size is normal. Hepatobiliary: No pericardial effusion or significant coronary artery calcifications. The there is mild intrahepatic biliary duct dilatation. Common bile duct is 10 millimeters. Gallbladder appears contracted. Gallbladder wall is thickened or there is small amount of pericholecystic fluid. Pancreas: There is increased, mild dilatation of the pancreatic duct. Spleen: Normal in size without focal abnormality. Adrenals/Urinary Tract: Adrenal glands are normal in appearance. The RIGHT kidney is diminutive and there are linear areas of scarring throughout the kidney. No hydronephrosis. The LEFT kidney is normal in appearance. No suspicious renal mass. The urinary bladder is decompressed. Stomach/Bowel: The stomach is normal in appearance. There is bowel wall thickening and inflammation involving the majority of the small bowel, beginning with the jejunum and extending to the terminal ileum. The findings are diffuse and symmetric. There are small collections of mesenteric fluid. The colon is unremarkable. There is a small amount of fluid surrounding the appendix but this appears to be a secondary process. The appendix is normal in diameter. Vascular/Lymphatic: No significant vascular findings are present. No enlarged abdominal or pelvic lymph nodes. Reproductive: Uterus is present.  No adnexal mass. Other: There is a small amount of free pelvic fluid, increased since prior  study. Musculoskeletal: No acute or significant osseous findings. IMPRESSION: 1. Significant small bowel abnormality without change in distribution. Findings are consistent with enteritis of the mid and distal small bowel. No evidence for perforation or abscess. 2. Increased ascites. 3. Increased intrahepatic and pancreatic duct dilatation. 4. Question of gallbladder wall thickening versus pericholecystic fluid. Consider further evaluation with RIGHT UPPER QUADRANT ultrasound. Electronically Signed   By: Nolon Nations M.D.   On: 05/08/2018 18:01   Mr 3d Recon At Scanner  Result Date: 05/10/2018 CLINICAL DATA:  Abdominal pain with swelling and nausea. Intrahepatic and pancreatic duct dilatation with cholelithiasis along with small bowel wall thickening. EXAM: MRI ABDOMEN WITHOUT AND WITH CONTRAST (INCLUDING MRCP) TECHNIQUE: Multiplanar multisequence MR imaging of the abdomen was performed both before and after the administration of intravenous contrast. Heavily T2-weighted images of the biliary and pancreatic ducts were obtained, and three-dimensional MRCP images were rendered by post processing. CONTRAST:  76mL MULTIHANCE GADOBENATE DIMEGLUMINE 529 MG/ML IV SOLN COMPARISON:  Multiple exams, including 05/08/2018 CT scan and ultrasound from 05/09/2018 FINDINGS: Despite efforts by the technologist and patient, motion artifact is present on today's exam and could not be eliminated. This reduces exam sensitivity and specificity. Lower chest: Unremarkable Hepatobiliary: Multiple filling defects in the gallbladder are present measuring up to 0.9 cm in long axis. Some of these are non dependent. There is dilatation of the common bile duct at 0.9 cm with distal conical tapering and without appreciable distal filling defect in the common bile duct, subject to the limitations of motion artifact. Minimal intrahepatic biliary dilatation. No irregularity of the biliary system or definite abnormal enhancement along the  biliary walls. No pericholecystic fluid. There is an approximately 3 mm T2 hyperintense lesion laterally in the right hepatic lobe on image 20/5 without appreciable enhancement, probably a tiny cyst but technically too small to characterize. No worrisome abnormal enhancement in the liver. Pancreas: Currently no significant dorsal pancreatic duct dilatation. No abnormal pancreatic enhancement. Spleen:  Unremarkable Adrenals/Urinary Tract: Right renal atrophy and scarring. Adrenal glands normal. Stomach/Bowel: On the coronal images, some of the pelvic loops of thickened distal small bowel are identified, likely to include the terminal ileum. Some of these loops are borderline dilated. Vascular/Lymphatic:  Unremarkable Other: Trace perihepatic ascites posteriorly on images 24-27 of series 13. Musculoskeletal: Unremarkable IMPRESSION: 1. Cholelithiasis with dilated common bile duct up to 9 mm in diameter, without current dilatation of the dorsal pancreatic duct. Note that the patient has previously also had dilated common bile duct, up to 8 mm in diameter on 12/04/2016. No definite stone is identified in the common bile duct, with understanding that there is some degradation of negative predictive value due to motion artifact. Acute cholecystitis is not excluded. 2. Trace perihepatic ascites adjacent to the right hepatic lobe inferiorly. 3. No abnormal enhancement along the biliary tree to suggest cholangitis. 4. Thickened distal small bowel loops, including the terminal ileum, potentially from infectious enteritis or inflammatory bowel disease. 5. Right renal scarring and resulting atrophy. Electronically Signed   By: Van Clines M.D.   On: 05/10/2018 09:33   Dg Chest Port 1 View  Result Date: 05/10/2018 CLINICAL DATA:  Preop EXAM: PORTABLE CHEST 1 VIEW COMPARISON:  02/11/2018 FINDINGS: Normal heart size. Lungs clear. No pneumothorax. No pleural effusion. IMPRESSION: No active disease. Electronically Signed    By: Marybelle Killings M.D.   On: 05/10/2018 18:59  Mr Abdomen Mrcp Moise Boring Contast  Result Date: 05/10/2018 CLINICAL DATA:  Abdominal pain with swelling and nausea. Intrahepatic and pancreatic duct dilatation with cholelithiasis along with small bowel wall thickening. EXAM: MRI ABDOMEN WITHOUT AND WITH CONTRAST (INCLUDING MRCP) TECHNIQUE: Multiplanar multisequence MR imaging of the abdomen was performed both before and after the administration of intravenous contrast. Heavily T2-weighted images of the biliary and pancreatic ducts were obtained, and three-dimensional MRCP images were rendered by post processing. CONTRAST:  22mL MULTIHANCE GADOBENATE DIMEGLUMINE 529 MG/ML IV SOLN COMPARISON:  Multiple exams, including 05/08/2018 CT scan and ultrasound from 05/09/2018 FINDINGS: Despite efforts by the technologist and patient, motion artifact is present on today's exam and could not be eliminated. This reduces exam sensitivity and specificity. Lower chest: Unremarkable Hepatobiliary: Multiple filling defects in the gallbladder are present measuring up to 0.9 cm in long axis. Some of these are non dependent. There is dilatation of the common bile duct at 0.9 cm with distal conical tapering and without appreciable distal filling defect in the common bile duct, subject to the limitations of motion artifact. Minimal intrahepatic biliary dilatation. No irregularity of the biliary system or definite abnormal enhancement along the biliary walls. No pericholecystic fluid. There is an approximately 3 mm T2 hyperintense lesion laterally in the right hepatic lobe on image 20/5 without appreciable enhancement, probably a tiny cyst but technically too small to characterize. No worrisome abnormal enhancement in the liver. Pancreas: Currently no significant dorsal pancreatic duct dilatation. No abnormal pancreatic enhancement. Spleen:  Unremarkable Adrenals/Urinary Tract: Right renal atrophy and scarring. Adrenal glands normal.  Stomach/Bowel: On the coronal images, some of the pelvic loops of thickened distal small bowel are identified, likely to include the terminal ileum. Some of these loops are borderline dilated. Vascular/Lymphatic:  Unremarkable Other: Trace perihepatic ascites posteriorly on images 24-27 of series 13. Musculoskeletal: Unremarkable IMPRESSION: 1. Cholelithiasis with dilated common bile duct up to 9 mm in diameter, without current dilatation of the dorsal pancreatic duct. Note that the patient has previously also had dilated common bile duct, up to 8 mm in diameter on 12/04/2016. No definite stone is identified in the common bile duct, with understanding that there is some degradation of negative predictive value due to motion artifact. Acute cholecystitis is not excluded. 2. Trace perihepatic ascites adjacent to the right hepatic lobe inferiorly. 3. No abnormal enhancement along the biliary tree to suggest cholangitis. 4. Thickened distal small bowel loops, including the terminal ileum, potentially from infectious enteritis or inflammatory bowel disease. 5. Right renal scarring and resulting atrophy. Electronically Signed   By: Van Clines M.D.   On: 05/10/2018 09:33   US Abdomen Limited Ruq  Result Date: 05/09/2018 CLINICAL DATA:  Right upper quadrant abdominal pain for 1 week with nausea, vomiting and diarrhea. EXAM: ULTRASOUND ABDOMEN LIMITED RIGHT UPPER QUADRANT COMPARISON:  CT 05/08/2018. FINDINGS: Gallbladder: Multiple gallstones are again noted. The gallbladder is better distended and demonstrates no wall thickening. However, the patient is reported to have a sonographic Murphy's sign by the sonographer. Common bile duct: Diameter: 7 mm.  Less dilated than yesterday's CT. Liver: No focal lesion identified. Within normal limits in parenchymal echogenicity. Portal vein is patent on color Doppler imaging with normal direction of blood flow towards the liver. IMPRESSION: 1. Cholelithiasis without  objective signs of cholecystitis. The patient is reported to have a sonographic Murphy sign, however. 2. Mild biliary dilatation, improved from recent CT. Electronically Signed   By: Richardean Sale M.D.   On: 05/09/2018  10:17    EKG None  Radiology No results found.  Procedures Procedures (including critical care time)  Medications Ordered in ED Medications  sodium chloride 0.9 % bolus 1,000 mL (has no administration in time range)  ondansetron (ZOFRAN) injection 4 mg (has no administration in time range)     Initial Impression / Assessment and Plan / ED Course  I have reviewed the triage vital signs and the nursing notes.  Pertinent labs & imaging results that were available during my care of the patient were reviewed by me and considered in my medical decision making (see chart for details).  Iv ns bolus. zofran iv. Labs sent.   Reviewed nursing notes and prior charts for additional history.   Additional ns iv.  Labs reviewed - chem normal.   Trial of po fluids. No emesis during 4 hours of observation in ED.   During 4 hr period, was up to bathroom 1 x to give urine sample, no recurrent or severe diarrhea during ED stay.   Ancef given, warm compresses to area left arm.   On recheck, abd is soft and non tender. Pt afebrile.   Pt currently appears stable for d/c.     Final Clinical Impressions(s) / ED Diagnoses   Final diagnoses:  None    ED Discharge Orders    None       Lajean Saver, MD 05/16/18 1409

## 2018-05-16 NOTE — ED Notes (Signed)
Pt made aware a urine sample is needed. Pt asked for something to drink. Pt made aware test results need to be reviewed before a drink can be given.

## 2018-05-16 NOTE — ED Notes (Signed)
Patient given sprite for fluid challenge and warm compresses, tolerating well.

## 2018-05-17 ENCOUNTER — Encounter: Payer: Self-pay | Admitting: Gastroenterology

## 2018-05-17 NOTE — Telephone Encounter (Signed)
OV made and appt letter mailed °

## 2018-05-17 NOTE — Telephone Encounter (Signed)
That's fine

## 2018-05-17 NOTE — Telephone Encounter (Signed)
Can I use your next available URG on 9/17? We don't have any urgents available in 2 weeks.

## 2018-05-18 ENCOUNTER — Telehealth: Payer: Self-pay | Admitting: Emergency Medicine

## 2018-05-18 ENCOUNTER — Other Ambulatory Visit: Payer: Self-pay

## 2018-05-18 ENCOUNTER — Telehealth: Payer: Self-pay | Admitting: General Surgery

## 2018-05-18 ENCOUNTER — Other Ambulatory Visit: Payer: Self-pay | Admitting: Emergency Medicine

## 2018-05-18 DIAGNOSIS — K529 Noninfective gastroenteritis and colitis, unspecified: Secondary | ICD-10-CM

## 2018-05-18 MED ORDER — ONDANSETRON HCL 4 MG PO TABS: 4.0000 mg | ORAL_TABLET | Freq: Three times a day (TID) | ORAL | 0 refills | Status: DC | PRN

## 2018-05-18 MED ORDER — CYCLOBENZAPRINE HCL 5 MG PO TABS: 5.0000 mg | ORAL_TABLET | Freq: Three times a day (TID) | ORAL | 0 refills | Status: DC | PRN

## 2018-05-18 NOTE — Telephone Encounter (Signed)
Called patient to inform her that fter speaking to the provider she stated she will not be able to send in any refills for narcotics for the patient at this time but the provider did send in a refill in for flexeril to [place] [facility] and that she would have to follow up with the provider who writes for her suboxone for any thing else, patient stated she understood.

## 2018-05-18 NOTE — Telephone Encounter (Signed)
[facility]  Patient had Rx by Dr. Jerilee Hoh, Hospitalist, for Oxycodone 5 mg 1-2 q 4 PRN quantity 25 on her AVS that was printed out. This was for Lap cholecystectomy.    Discussed with [facility]. They did not fill the Rx due to recent Suboxone that was filled. She would Isley have this Roxi Rx on paper.  Given her history will ask her to discuss pain med options with Dr. Warren Lacy. I will give her a 1 time Rx of Flexeril 5mg  q8 PRN (quantity 15) which was sent to [facility]  Will have office call her back and let her know that flexeril was filled but not Roxi.   PMP Aware on 05/18/2018 database look up.     Jamie Labrum, MD [facility] [address] [place], [place] [address] [phone] (office)

## 2018-05-18 NOTE — Telephone Encounter (Signed)
[name]:   Can we order Hep B surface antigen, Hep B surface antibody, Hep B core antibody, TB gold assay, TPMT enzymes? She will be seeing me in September but good to have this on file due to concern for IBD. Will need endoscopic evaluation to be arranged after visit.

## 2018-05-19 ENCOUNTER — Other Ambulatory Visit: Payer: Self-pay

## 2018-05-19 ENCOUNTER — Other Ambulatory Visit: Payer: Self-pay | Admitting: Gastroenterology

## 2018-05-19 DIAGNOSIS — K529 Noninfective gastroenteritis and colitis, unspecified: Secondary | ICD-10-CM

## 2018-05-19 NOTE — Progress Notes (Signed)
Labs entered.

## 2018-05-19 NOTE — Telephone Encounter (Signed)
LMOM for a return call. I will need copies of her insurance cards to attach to the [facility] order.

## 2018-05-20 ENCOUNTER — Telehealth: Payer: Self-pay | Admitting: Gastroenterology

## 2018-05-20 NOTE — Telephone Encounter (Signed)
LMOM for a return call.  

## 2018-05-20 NOTE — Telephone Encounter (Signed)
PATIENT LEFT MESSAGE THAT SHE WAS RETURNING A CALL

## 2018-05-20 NOTE — Telephone Encounter (Signed)
Letter mailed for pt to call.  

## 2018-05-20 NOTE — Telephone Encounter (Signed)
See separate phone note.

## 2018-05-20 NOTE — Telephone Encounter (Signed)
LMOM to call.

## 2018-05-25 ENCOUNTER — Ambulatory Visit: Payer: Self-pay | Admitting: General Surgery

## 2018-06-22 ENCOUNTER — Ambulatory Visit: Payer: Medicare Other | Admitting: Gastroenterology

## 2018-06-22 ENCOUNTER — Encounter: Payer: Self-pay | Admitting: Gastroenterology

## 2018-06-22 ENCOUNTER — Telehealth: Payer: Self-pay | Admitting: Gastroenterology

## 2018-06-22 NOTE — Telephone Encounter (Signed)
PATIENT WAS A NO SHOW AND LETTER SENT  °

## 2018-08-11 ENCOUNTER — Emergency Department (HOSPITAL_COMMUNITY): Payer: Medicare Other

## 2018-08-11 ENCOUNTER — Emergency Department (HOSPITAL_COMMUNITY)
Admission: EM | Admit: 2018-08-11 | Discharge: 2018-08-11 | Disposition: A | Discharge status: Home / Self Care | Payer: Medicare Other | Attending: Emergency Medicine | Admitting: Emergency Medicine

## 2018-08-11 ENCOUNTER — Other Ambulatory Visit: Payer: Self-pay

## 2018-08-11 ENCOUNTER — Encounter (HOSPITAL_COMMUNITY): Payer: Self-pay | Admitting: Emergency Medicine

## 2018-08-11 DIAGNOSIS — J069 Acute upper respiratory infection, unspecified: Secondary | ICD-10-CM

## 2018-08-11 DIAGNOSIS — Z9049 Acquired absence of other specified parts of digestive tract: Secondary | ICD-10-CM | POA: Diagnosis not present

## 2018-08-11 DIAGNOSIS — Z91040 Latex allergy status: Secondary | ICD-10-CM | POA: Diagnosis not present

## 2018-08-11 DIAGNOSIS — F4323 Adjustment disorder with mixed anxiety and depressed mood: Secondary | ICD-10-CM | POA: Diagnosis not present

## 2018-08-11 DIAGNOSIS — F17210 Nicotine dependence, cigarettes, uncomplicated: Secondary | ICD-10-CM | POA: Insufficient documentation

## 2018-08-11 DIAGNOSIS — R1032 Left lower quadrant pain: Secondary | ICD-10-CM | POA: Diagnosis present

## 2018-08-11 DIAGNOSIS — B9789 Other viral agents as the cause of diseases classified elsewhere: Secondary | ICD-10-CM

## 2018-08-11 DIAGNOSIS — R112 Nausea with vomiting, unspecified: Secondary | ICD-10-CM

## 2018-08-11 DIAGNOSIS — K529 Noninfective gastroenteritis and colitis, unspecified: Secondary | ICD-10-CM | POA: Diagnosis not present

## 2018-08-11 DIAGNOSIS — R197 Diarrhea, unspecified: Secondary | ICD-10-CM

## 2018-08-11 DIAGNOSIS — R1030 Lower abdominal pain, unspecified: Secondary | ICD-10-CM

## 2018-08-11 LAB — CBC WITH DIFFERENTIAL/PLATELET
ABS IMMATURE GRANULOCYTES: 0.03 10*3/uL (ref 0.00–0.07)
BASOS PCT: 0 %
Basophils Absolute: 0 10*3/uL (ref 0.0–0.1)
Eosinophils Absolute: 0.2 10*3/uL (ref 0.0–0.5)
Eosinophils Relative: 2 %
HEMATOCRIT: 48.3 % — AB (ref 36.0–46.0)
HEMOGLOBIN: 15.5 g/dL — AB (ref 12.0–15.0)
Immature Granulocytes: 0 %
LYMPHS ABS: 2.3 10*3/uL (ref 0.7–4.0)
Lymphocytes Relative: 21 %
MCH: 30.3 pg (ref 26.0–34.0)
MCHC: 32.1 g/dL (ref 30.0–36.0)
MCV: 94.3 fL (ref 80.0–100.0)
MONO ABS: 0.4 10*3/uL (ref 0.1–1.0)
MONOS PCT: 4 %
NEUTROS ABS: 8 10*3/uL — AB (ref 1.7–7.7)
Neutrophils Relative %: 73 %
PLATELETS: 218 10*3/uL (ref 150–400)
RBC: 5.12 MIL/uL — ABNORMAL HIGH (ref 3.87–5.11)
RDW: 12.7 % (ref 11.5–15.5)
WBC: 10.9 10*3/uL — ABNORMAL HIGH (ref 4.0–10.5)
nRBC: 0 % (ref 0.0–0.2)

## 2018-08-11 LAB — URINALYSIS, ROUTINE W REFLEX MICROSCOPIC
Bilirubin Urine: NEGATIVE
GLUCOSE, UA: NEGATIVE mg/dL
Hgb urine dipstick: NEGATIVE
Ketones, ur: NEGATIVE mg/dL
LEUKOCYTES UA: NEGATIVE
Nitrite: NEGATIVE
PH: 5 (ref 5.0–8.0)
Protein, ur: NEGATIVE mg/dL
SPECIFIC GRAVITY, URINE: 1.027 (ref 1.005–1.030)

## 2018-08-11 LAB — COMPREHENSIVE METABOLIC PANEL
ALBUMIN: 3.8 g/dL (ref 3.5–5.0)
ALK PHOS: 87 U/L (ref 38–126)
ALT: 29 U/L (ref 0–44)
AST: 19 U/L (ref 15–41)
Anion gap: 8 (ref 5–15)
BILIRUBIN TOTAL: 1 mg/dL (ref 0.3–1.2)
BUN: 10 mg/dL (ref 6–20)
CO2: 28 mmol/L (ref 22–32)
Calcium: 8.9 mg/dL (ref 8.9–10.3)
Chloride: 104 mmol/L (ref 98–111)
Creatinine, Ser: 0.71 mg/dL (ref 0.44–1.00)
GFR calc Af Amer: >60 mL/min (ref >60)
GFR calc non Af Amer: >60 mL/min (ref >60)
GLUCOSE: 101 mg/dL — AB (ref 70–99)
Potassium: 3.5 mmol/L (ref 3.5–5.1)
Sodium: 140 mmol/L (ref 135–145)
TOTAL PROTEIN: 7.2 g/dL (ref 6.5–8.1)

## 2018-08-11 LAB — MAGNESIUM: Magnesium: 2.1 mg/dL (ref 1.7–2.4)

## 2018-08-11 LAB — PREGNANCY, URINE: Preg Test, Ur: NEGATIVE

## 2018-08-11 MED ORDER — LOPERAMIDE HCL 2 MG PO CAPS: 4.0000 mg | ORAL_CAPSULE | Freq: Every evening | ORAL | 0 refills | Status: DC | PRN

## 2018-08-11 MED ORDER — BENZONATATE 100 MG PO CAPS: 100.0000 mg | ORAL_CAPSULE | Freq: Three times a day (TID) | ORAL | 0 refills | Status: DC

## 2018-08-11 MED ORDER — HYDROCODONE-ACETAMINOPHEN 5-325 MG PO TABS
1.0000 | ORAL_TABLET | Freq: Once | ORAL | Status: AC
  Administered 2018-08-11: 1 via ORAL
  Filled 2018-08-11: qty 1

## 2018-08-11 MED ORDER — ONDANSETRON 8 MG PO TBDP: 8.0000 mg | ORAL_TABLET | Freq: Three times a day (TID) | ORAL | 0 refills | Status: DC | PRN

## 2018-08-11 MED ORDER — SODIUM CHLORIDE 0.9 % IV BOLUS
1000.0000 mL | Freq: Once | INTRAVENOUS | Status: AC
  Administered 2018-08-11: 1000 mL via INTRAVENOUS

## 2018-08-11 MED ORDER — ACETAMINOPHEN ER 650 MG PO TBCR: 650.0000 mg | EXTENDED_RELEASE_TABLET | Freq: Three times a day (TID) | ORAL | 0 refills | Status: DC

## 2018-08-11 MED ORDER — ONDANSETRON 8 MG PO TBDP
8.0000 mg | ORAL_TABLET | Freq: Once | ORAL | Status: AC
  Administered 2018-08-11: 8 mg via ORAL
  Filled 2018-08-11: qty 1

## 2018-08-11 NOTE — ED Notes (Signed)
Patient drinking soda (Coke) at this time and trying to eat saltine crackers.

## 2018-08-11 NOTE — Discharge Instructions (Addendum)
We signed the ER for abdominal pain, vomiting and diarrhea. The lab work-up in the ER is overall reassuring.  Chest x-ray does not show any signs of infection.  We are not sure what is the underlying cause for your symptoms, but it appears to be colitis -which is a self-limiting process. Take the medications prescribed and make sure you are hydrating yourself well.  Clear liquid diet is recommended.  Return to the ER immediately if your pain starts getting worse, you are unable to tolerate liquids, you start having bloody stools, bloody vomiting or fevers.

## 2018-08-11 NOTE — ED Provider Notes (Signed)
[facility] EMERGENCY DEPARTMENT Provider Note   CSN: [id number] Arrival date & time: 08/11/18  1336     History   Chief Complaint Chief Complaint  Patient presents with  . Abdominal Pain    HPI [name] is a 47 y.o. female.  HPI  47 year old female comes in with chief complaint of abdominal pain. Patient reports that she started having this abdominal pain with vomiting and diarrhea about 2 days ago.  Her abdominal pain however got severe today.  Pain is located over the lower abdominal quadrants and on the left side.  She describes the pain as intermittent and sharp, but now it has gotten more frequent.  Patient has had associated nausea with emesis x2 and diarrhea with loose bowel movements between 5-10 times.  There is no history of similar symptoms in the past.  She has no UTI-like symptoms and denies any recent renal stones.  Patient is status post cholecystectomy and C-section.  Past Medical History:  Diagnosis Date  . Adjustment disorder   . Arthritis    hands, knees  . Endometrial polyp   . Fibromyalgia   . Headache(784.0)   . Pregnancy   . SVD (spontaneous vaginal delivery)    x 1    Patient Active Problem List   Diagnosis Date Noted  . Enteritis   . Acute cholecystitis 05/09/2018  . Symptomatic cholelithiasis 05/09/2018  . Chronic pain syndrome 05/09/2018  . Calculus of gallbladder with acute cholecystitis without obstruction   . Menorrhagia with regular cycle 03/19/2017  . Adjustment disorder with mixed anxiety and depressed mood 02/21/2015  . Dysmenorrhea 01/08/2015  . Dyspareunia 01/08/2015  . Uterus retroversion 01/08/2015  . Sterilization 06/25/2012    Past Surgical History:  Procedure Laterality Date  . CESAREAN SECTION     x 4  . CHOLECYSTECTOMY N/A 05/11/2018   Procedure: LAPAROSCOPIC CHOLECYSTECTOMY;  Surgeon: Virl Cagey, MD;  Location: [facility];  Service: General;  Laterality: N/A;  . right thumb surgery     pins  . WISDOM  TOOTH EXTRACTION       OB History    Gravida  7   Para  6   Term  6   Preterm      AB  1   Living  6     SAB  1   TAB      Ectopic      Multiple      Live Births  1            Home Medications    Prior to Admission medications   Medication Sig Start Date End Date Taking? Authorizing Provider  acetaminophen (TYLENOL 8 HOUR) 650 MG CR tablet Take 1 tablet (650 mg total) by mouth every 8 (eight) hours. 08/11/18   Varney Biles, MD  benzonatate (TESSALON) 100 MG capsule Take 1 capsule (100 mg total) by mouth every 8 (eight) hours. 08/11/18   Varney Biles, MD  cephALEXin (KEFLEX) 500 MG capsule Take 1 capsule (500 mg total) by mouth 4 (four) times daily. Patient not taking: Reported on 08/11/2018 05/16/18   Lajean Saver, MD  cyclobenzaprine (FLEXERIL) 5 MG tablet Take 1 tablet (5 mg total) by mouth every 8 (eight) hours as needed for muscle spasms. Patient not taking: Reported on 08/11/2018 05/18/18   Virl Cagey, MD  loperamide (IMODIUM) 2 MG capsule Take 2 capsules (4 mg total) by mouth at bedtime as needed for diarrhea or loose stools. 08/11/18   Pawan Knechtel,  MD  omeprazole (PRILOSEC) 40 MG capsule Take 1 capsule (40 mg total) by mouth daily. Patient not taking: Reported on 05/16/2018 05/12/18   Isaac Bliss, Rayford Halsted, MD  ondansetron (ZOFRAN ODT) 8 MG disintegrating tablet Take 1 tablet (8 mg total) by mouth every 8 (eight) hours as needed for nausea. 08/11/18   Varney Biles, MD  SUBOXONE 8-2 MG FILM Place 2 Film under the tongue daily. 08/06/18   [provider]    Family History Family History  Problem Relation Age of Onset  . Hypertension Mother   . Diabetes Mother   . Cancer Mother        lung  . CAD Father   . Heart failure Father   . Heart disease Father   . Seizures Son   . Diabetes Other   . Heart failure Other   . Hypertension Other   . Cancer Other   . Colon cancer Neg Hx   . Colon polyps Neg Hx   . Inflammatory bowel  disease Neg Hx     Social History Social History   Tobacco Use  . Smoking status: Current Every Day Smoker    Packs/day: 0.50    Years: 10.00    Pack years: 5.00    Types: Cigarettes  . Smokeless tobacco: Never Used  . Tobacco comment: 5-6 cigarettes a day  Substance Use Topics  . Alcohol use: No    Alcohol/week: 0.0 standard drinks  . Drug use: No     Allergies   Aspirin; Nsaids; Latex; Morphine and related; Phenergan [promethazine]; Penicillins; and Toradol [ketorolac tromethamine]   Review of Systems Review of Systems  Constitutional: Positive for activity change.  Respiratory: Positive for cough.   Gastrointestinal: Positive for abdominal pain, diarrhea, nausea and vomiting.  Allergic/Immunologic: Negative for immunocompromised state.  Hematological: Does not bruise/bleed easily.  All other systems reviewed and are negative.    Physical Exam Updated Vital Signs BP (!) 139/94 (BP Location: Right Arm)   Pulse 76   Temp 98.4 F (36.9 C) (Oral)   Resp 12   Ht 5\' 6"  (1.676 m)   Wt 77.1 kg   LMP 06/11/2018   SpO2 99%   BMI 27.44 kg/m   Physical Exam  Constitutional: She is oriented to person, place, and time. She appears well-developed.  HENT:  Head: Normocephalic and atraumatic.  Eyes: EOM are normal.  Neck: Normal range of motion. Neck supple.  Cardiovascular: Normal rate.  Pulmonary/Chest: Effort normal.  Abdominal: Bowel sounds are normal. There is tenderness in the suprapubic area and left lower quadrant. There is guarding. There is no rebound.  Neurological: She is alert and oriented to person, place, and time.  Skin: Skin is warm and dry.  Nursing note and vitals reviewed.    ED Treatments / Results  Labs (all labs ordered are listed, but only abnormal results are displayed) Labs Reviewed  COMPREHENSIVE METABOLIC PANEL - Abnormal; Notable for the following components:      Result Value   Glucose, Bld 101 (*)    All other components within  normal limits  CBC WITH DIFFERENTIAL/PLATELET - Abnormal; Notable for the following components:   WBC 10.9 (*)    RBC 5.12 (*)    Hemoglobin 15.5 (*)    HCT 48.3 (*)    Neutro Abs 8.0 (*)    All other components within normal limits  PREGNANCY, URINE  URINALYSIS, ROUTINE W REFLEX MICROSCOPIC  MAGNESIUM    EKG None  Radiology Dg Chest  2 View  Result Date: 08/11/2018 CLINICAL DATA:  Productive cough and shortness of breath for 5 days. Vomiting and diarrhea for 2 days. EXAM: CHEST - 2 VIEW COMPARISON:  Chest radiograph May 10, 2018 FINDINGS: Cardiac silhouette is mildly enlarged and unchanged. Mediastinal silhouette is unremarkable. No pleural effusion or focal consolidation. No pneumothorax. Soft tissue planes and included osseous structures are non suspicious. IMPRESSION: 1. Mild cardiomegaly, no acute pulmonary process. Electronically Signed   By: Elon Alas M.D.   On: 08/11/2018 17:32    Procedures Procedures (including critical care time)  Medications Ordered in ED Medications  sodium chloride 0.9 % bolus 1,000 mL (0 mLs Intravenous Stopped 08/11/18 1907)  HYDROcodone-acetaminophen (NORCO/VICODIN) 5-325 MG per tablet 1 tablet (1 tablet Oral Given 08/11/18 1900)  ondansetron (ZOFRAN-ODT) disintegrating tablet 8 mg (8 mg Oral Given 08/11/18 1902)     Initial Impression / Assessment and Plan / ED Course  I have reviewed the triage vital signs and the nursing notes.  Pertinent labs & imaging results that were available during my care of the patient were reviewed by me and considered in my medical decision making (see chart for details).  Clinical Course as of Aug 11 2137  Wed Aug 11, 2018  2137 Patient's lab is overall reassuring.  UA does not show any markers of infection or severe dehydration.  Patient's electrolytes are within normal limits.  White count is slightly elevated at 10.9. Repeat abdominal exam was completed and the abdomen continues to be soft and there is  no peritoneal findings.  P.o. challenge has already been passed.  Patient has tolerated oral pain medication.  I discussed the results with the patient and her family member.  She has been made aware that work-up in the ED overall appears reassuring.  I informed her that I think most likely she is having a viral gastroenteritis and that her symptoms should be self-limiting and improving over a short time.  Strict ER return precautions have been discussed with the patient in case her symptoms get worse.  Patient agrees with the plan.  WBC(!): 10.9 [AN]    Clinical Course User Index [AN] Varney Biles, MD    47 year old female comes in with chief complaint of abdominal pain, nausea, vomiting and diarrhea.  Symptoms have been going on for the last 2 days.  Patient had some flulike illness that started about a week ago.  She does not have any UTI-like symptoms, denies any vaginal discharge-bleeding and does not have any risk factors for STDs.  On exam patient has diffuse lower quadrant tenderness, worse in the left lower quadrant.  Abdomen is soft, there is no rebound but patient is guarding.  Pain is intermittent, unprovoked -which is not typical of diverticulitis.  Colitis is possible.  Given the diarrhea, it is unlikely that this is a renal stone, PID or ovarian torsion which were also considered in the differential diagnosis  Final Clinical Impressions(s) / ED Diagnoses   Final diagnoses:  Nausea vomiting and diarrhea  Lower abdominal pain  Viral URI with cough  Acute colitis    ED Discharge Orders         Ordered    ondansetron (ZOFRAN ODT) 8 MG disintegrating tablet  Every 8 hours PRN     08/11/18 2107    loperamide (IMODIUM) 2 MG capsule  At bedtime PRN     08/11/18 2107    acetaminophen (TYLENOL 8 HOUR) 650 MG CR tablet  Every 8 hours  08/11/18 2107    benzonatate (TESSALON) 100 MG capsule  Every 8 hours     08/11/18 2110           Varney Biles, MD 08/11/18  2138

## 2018-08-11 NOTE — ED Triage Notes (Signed)
Patient complaining of vomiting and diarrhea x 2 days and abdominal pain starting this morning.

## 2020-03-17 ENCOUNTER — Emergency Department (HOSPITAL_COMMUNITY)
Admission: EM | Admit: 2020-03-17 | Discharge: 2020-03-17 | Disposition: A | Discharge status: Home / Self Care | Payer: Medicare Other | Attending: Emergency Medicine | Admitting: Emergency Medicine

## 2020-03-17 ENCOUNTER — Encounter (HOSPITAL_COMMUNITY): Payer: Self-pay | Admitting: *Deleted

## 2020-03-17 ENCOUNTER — Other Ambulatory Visit: Payer: Self-pay

## 2020-03-17 DIAGNOSIS — F17210 Nicotine dependence, cigarettes, uncomplicated: Secondary | ICD-10-CM | POA: Insufficient documentation

## 2020-03-17 DIAGNOSIS — K0889 Other specified disorders of teeth and supporting structures: Secondary | ICD-10-CM | POA: Insufficient documentation

## 2020-03-17 DIAGNOSIS — K029 Dental caries, unspecified: Secondary | ICD-10-CM | POA: Diagnosis not present

## 2020-03-17 MED ORDER — CLINDAMYCIN HCL 150 MG PO CAPS: 150.0000 mg | ORAL_CAPSULE | Freq: Four times a day (QID) | ORAL | 0 refills | Status: AC

## 2020-03-17 MED ORDER — BUPIVACAINE HCL (PF) 0.25 % IJ SOLN
10.0000 mL | Freq: Once | INTRAMUSCULAR | Status: AC
  Administered 2020-03-17: 10 mL
  Filled 2020-03-17: qty 30

## 2020-03-17 NOTE — ED Triage Notes (Signed)
Pt with right upper dental pain since tooth broke yesterday.  Pain has radiated to right side of head.

## 2020-03-17 NOTE — Discharge Instructions (Addendum)
You were evaluated in the Emergency Department and after careful evaluation, we did not find any emergent condition requiring admission or further testing in the hospital.  Your exam/testing today is overall reassuring.  We provided a nerve block here in the emergency department which should give you a few hours of relief.  Please take the clindamycin antibiotics as directed.  We recommend Tylenol and Motrin as well for discomfort.  Please return to the Emergency Department if you experience any worsening of your condition.  We encourage you to follow up with a primary care provider.  Thank you for allowing [place] to be a part of your care.

## 2020-03-17 NOTE — ED Provider Notes (Signed)
[facility] Emergency Department Provider Note MRN:  [id number]  Arrival date & time: 03/17/20     Chief Complaint   Dental Pain   History of Present Illness   [name] is a 49 y.o. year-old female with a history of fibromyalgia presenting to the ED with chief complaint of dental pain.  Patient was eating potato chips yesterday and while eating one of her upper right teeth broke/crumbled.  She has had significant tooth pain since that time.  Pain is constant, severe.  Denies fever, no other trauma, no other complaints.  Review of Systems  A problem-focused ROS was performed. Positive for tooth pain.  Patient denies fever.  Patient's Health History    Past Medical History:  Diagnosis Date  . Adjustment disorder   . Arthritis    hands, knees  . Endometrial polyp   . Fibromyalgia   . Headache(784.0)   . Pregnancy   . SVD (spontaneous vaginal delivery)    x 1    Past Surgical History:  Procedure Laterality Date  . CESAREAN SECTION     x 4  . CHOLECYSTECTOMY N/A 05/11/2018   Procedure: LAPAROSCOPIC CHOLECYSTECTOMY;  Surgeon: Virl Cagey, MD;  Location: [facility];  Service: General;  Laterality: N/A;  . right thumb surgery     pins  . WISDOM TOOTH EXTRACTION      Family History  Problem Relation Age of Onset  . Hypertension Mother   . Diabetes Mother   . Cancer Mother        lung  . CAD Father   . Heart failure Father   . Heart disease Father   . Seizures Son   . Diabetes Other   . Heart failure Other   . Hypertension Other   . Cancer Other   . Colon cancer Neg Hx   . Colon polyps Neg Hx   . Inflammatory bowel disease Neg Hx     Social History   Socioeconomic History  . Marital status: Single    Spouse name: Not on file  . Number of children: Not on file  . Years of education: Not on file  . Highest education level: Not on file  Occupational History  . Not on file  Tobacco Use  . Smoking status: Current Every Day Smoker     Packs/day: 0.50    Years: 10.00    Pack years: 5.00    Types: Cigarettes  . Smokeless tobacco: Never Used  . Tobacco comment: 5-6 cigarettes a day  Vaping Use  . Vaping Use: Never used  Substance and Sexual Activity  . Alcohol use: No    Alcohol/week: 0.0 standard drinks  . Drug use: No  . Sexual activity: Yes    Birth control/protection: None  Other Topics Concern  . Not on file  Social History Narrative  . Not on file   Social Determinants of Health   Financial Resource Strain:   . Difficulty of Paying Living Expenses:   Food Insecurity:   . Worried About Charity fundraiser in the Last Year:   . Arboriculturist in the Last Year:   Transportation Needs:   . Film/video editor (Medical):   Marland Kitchen Lack of Transportation (Non-Medical):   Physical Activity:   . Days of Exercise per Week:   . Minutes of Exercise per Session:   Stress:   . Feeling of Stress :   Social Connections:   . Frequency of Communication with  Friends and Family:   . Frequency of Social Gatherings with Friends and Family:   . Attends Religious Services:   . Active Member of Clubs or Organizations:   . Attends Archivist Meetings:   Marland Kitchen Marital Status:   Intimate Partner Violence:   . Fear of Current or Ex-Partner:   . Emotionally Abused:   Marland Kitchen Physically Abused:   . Sexually Abused:      Physical Exam   Vitals:   03/17/20 1957  BP: (!) 155/104  Pulse: 76  Resp: 16  Temp: (!) 97.2 F (36.2 C)  SpO2: 99%    CONSTITUTIONAL: Well-appearing, NAD NEURO:  Alert and oriented x 3, no focal deficits EYES:  eyes equal and reactive ENT/NECK:  no LAD, no JVD, significant decay with crumbling of the right maxillary molars CARDIO: Regular rate, well-perfused, normal S1 and S2 PULM:  CTAB no wheezing or rhonchi GI/GU:  normal bowel sounds, non-distended, non-tender MSK/SPINE:  No gross deformities, no edema SKIN:  no rash, atraumatic PSYCH:  Appropriate speech and behavior  *Additional  and/or pertinent findings included in MDM below  Diagnostic and Interventional Summary    EKG Interpretation  Date/Time:    Ventricular Rate:    PR Interval:    QRS Duration:   QT Interval:    QTC Calculation:   R Axis:     Text Interpretation:        Labs Reviewed - No data to display  No orders to display    Medications  bupivacaine (PF) (MARCAINE) 0.25 % injection 10 mL (10 mLs Infiltration Given by Other 03/17/20 2114)     Procedures  /  Critical Care .Nerve Block  Date/Time: 03/17/2020 9:42 PM Performed by: Maudie Flakes, MD Authorized by: Maudie Flakes, MD   Consent:    Consent obtained:  Verbal   Consent given by:  Patient   Risks discussed:  Pain, unsuccessful block and swelling   Alternatives discussed:  No treatment Indications:    Indications:  Pain relief Location:    Nerve block body site: Right maxillary molar dental block. Procedure details (see MAR for exact dosages):    Block needle gauge:  25 G   Anesthetic injected:  Bupivacaine 0.25% w/o epi   Injection procedure:  Anatomic landmarks identified and anatomic landmarks palpated   Paresthesia:  None Post-procedure details:    Dressing:  None   Outcome:  Pain relieved   Patient tolerance of procedure:  Tolerated well, no immediate complications    ED Course and Medical Decision Making  I have reviewed the triage vital signs, the nursing notes, and pertinent available records from the EMR.  Listed above are laboratory and imaging tests that I personally ordered, reviewed, and interpreted and then considered in my medical decision making (see below for details).      Tooth broken due to significant decay, needs to be extracted.  No signs of more significant infection or abscess.  Significant pain, nerve block provided as described above, providing antibiotics, appropriate for discharge.    Barth Kirks. Sedonia Small, [place] [email or web address]  Final Clinical Impressions(s) / ED Diagnoses     ICD-10-CM   1. Tooth decay  K02.9   2. Tooth pain  K08.89     ED Discharge Orders         Ordered    clindamycin (CLEOCIN) 150 MG capsule  Every 6 hours     Discontinue  Reprint  03/17/20 2141           Discharge Instructions Discussed with and Provided to Patient:     Discharge Instructions     You were evaluated in the Emergency Department and after careful evaluation, we did not find any emergent condition requiring admission or further testing in the hospital.  Your exam/testing today is overall reassuring.  We provided a nerve block here in the emergency department which should give you a few hours of relief.  Please take the clindamycin antibiotics as directed.  We recommend Tylenol and Motrin as well for discomfort.  Please return to the Emergency Department if you experience any worsening of your condition.  We encourage you to follow up with a primary care provider.  Thank you for allowing [place] to be a part of your care.       Maudie Flakes, MD 03/17/20 2144
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
## 2020-08-01 ENCOUNTER — Emergency Department (HOSPITAL_COMMUNITY)
Admission: EM | Admit: 2020-08-01 | Discharge: 2020-08-01 | Disposition: A | Discharge status: Home / Self Care | Payer: Medicare Other | Attending: Emergency Medicine | Admitting: Emergency Medicine

## 2020-08-01 ENCOUNTER — Other Ambulatory Visit: Payer: Self-pay

## 2020-08-01 ENCOUNTER — Encounter (HOSPITAL_COMMUNITY): Payer: Self-pay

## 2020-08-01 ENCOUNTER — Emergency Department (HOSPITAL_COMMUNITY): Payer: Medicare Other

## 2020-08-01 DIAGNOSIS — R197 Diarrhea, unspecified: Secondary | ICD-10-CM | POA: Diagnosis not present

## 2020-08-01 DIAGNOSIS — J069 Acute upper respiratory infection, unspecified: Secondary | ICD-10-CM

## 2020-08-01 DIAGNOSIS — J45909 Unspecified asthma, uncomplicated: Secondary | ICD-10-CM | POA: Insufficient documentation

## 2020-08-01 DIAGNOSIS — Z91040 Latex allergy status: Secondary | ICD-10-CM | POA: Insufficient documentation

## 2020-08-01 DIAGNOSIS — F17210 Nicotine dependence, cigarettes, uncomplicated: Secondary | ICD-10-CM | POA: Insufficient documentation

## 2020-08-01 DIAGNOSIS — R059 Cough, unspecified: Secondary | ICD-10-CM | POA: Diagnosis present

## 2020-08-01 HISTORY — DX: Unspecified asthma, uncomplicated: J45.909

## 2020-08-01 LAB — COMPREHENSIVE METABOLIC PANEL
ALT: 26 U/L (ref 0–44)
AST: 19 U/L (ref 15–41)
Albumin: 3.9 g/dL (ref 3.5–5.0)
Alkaline Phosphatase: 80 U/L (ref 38–126)
Anion gap: 7 (ref 5–15)
BUN: 10 mg/dL (ref 6–20)
CO2: 27 mmol/L (ref 22–32)
Calcium: 9.1 mg/dL (ref 8.9–10.3)
Chloride: 102 mmol/L (ref 98–111)
Creatinine, Ser: 0.77 mg/dL (ref 0.44–1.00)
GFR, Estimated: >60 mL/min (ref >60)
Glucose, Bld: 105 mg/dL — ABNORMAL HIGH (ref 70–99)
Potassium: 4 mmol/L (ref 3.5–5.1)
Sodium: 136 mmol/L (ref 135–145)
Total Bilirubin: 0.5 mg/dL (ref 0.3–1.2)
Total Protein: 7.2 g/dL (ref 6.5–8.1)

## 2020-08-01 LAB — CBC WITH DIFFERENTIAL/PLATELET
Abs Immature Granulocytes: 0.04 10*3/uL (ref 0.00–0.07)
Basophils Absolute: 0 10*3/uL (ref 0.0–0.1)
Basophils Relative: 1 %
Eosinophils Absolute: 0.1 10*3/uL (ref 0.0–0.5)
Eosinophils Relative: 2 %
HCT: 45.2 % (ref 36.0–46.0)
Hemoglobin: 14.7 g/dL (ref 12.0–15.0)
Immature Granulocytes: 1 %
Lymphocytes Relative: 31 %
Lymphs Abs: 2.5 10*3/uL (ref 0.7–4.0)
MCH: 29.8 pg (ref 26.0–34.0)
MCHC: 32.5 g/dL (ref 30.0–36.0)
MCV: 91.5 fL (ref 80.0–100.0)
Monocytes Absolute: 0.4 10*3/uL (ref 0.1–1.0)
Monocytes Relative: 5 %
Neutro Abs: 5.1 10*3/uL (ref 1.7–7.7)
Neutrophils Relative %: 60 %
Platelets: 204 10*3/uL (ref 150–400)
RBC: 4.94 MIL/uL (ref 3.87–5.11)
RDW: 12.3 % (ref 11.5–15.5)
WBC: 8.3 10*3/uL (ref 4.0–10.5)
nRBC: 0 % (ref 0.0–0.2)

## 2020-08-01 LAB — URINALYSIS, ROUTINE W REFLEX MICROSCOPIC
Bacteria, UA: NONE SEEN
Bilirubin Urine: NEGATIVE
Glucose, UA: NEGATIVE mg/dL
Ketones, ur: NEGATIVE mg/dL
Leukocytes,Ua: NEGATIVE
Nitrite: NEGATIVE
Protein, ur: NEGATIVE mg/dL
Specific Gravity, Urine: 1.004 — ABNORMAL LOW (ref 1.005–1.030)
pH: 7 (ref 5.0–8.0)

## 2020-08-01 LAB — LIPASE, BLOOD: Lipase: 42 U/L (ref 11–51)

## 2020-08-01 LAB — TROPONIN I (HIGH SENSITIVITY): Troponin I (High Sensitivity): 5 ng/L (ref <18)

## 2020-08-01 LAB — POC URINE PREG, ED: Preg Test, Ur: NEGATIVE

## 2020-08-01 MED ORDER — DEXAMETHASONE SODIUM PHOSPHATE 10 MG/ML IJ SOLN
8.0000 mg | Freq: Once | INTRAMUSCULAR | Status: AC
  Administered 2020-08-01: 8 mg via INTRAVENOUS
  Filled 2020-08-01: qty 1

## 2020-08-01 MED ORDER — BENZONATATE 100 MG PO CAPS: 100.0000 mg | ORAL_CAPSULE | Freq: Three times a day (TID) | ORAL | 0 refills | Status: DC

## 2020-08-01 MED ORDER — SODIUM CHLORIDE 0.9 % IV BOLUS
1000.0000 mL | Freq: Once | INTRAVENOUS | Status: AC
  Administered 2020-08-01: 1000 mL via INTRAVENOUS

## 2020-08-01 MED ORDER — PREDNISONE 20 MG PO TABS: 20.0000 mg | ORAL_TABLET | Freq: Every day | ORAL | 0 refills | Status: AC

## 2020-08-01 MED ORDER — ONDANSETRON HCL 4 MG/2ML IJ SOLN
4.0000 mg | Freq: Once | INTRAMUSCULAR | Status: AC
  Administered 2020-08-01: 4 mg via INTRAVENOUS
  Filled 2020-08-01: qty 2

## 2020-08-01 MED ORDER — AZITHROMYCIN 250 MG PO TABS: 250.0000 mg | ORAL_TABLET | Freq: Every day | ORAL | 0 refills | Status: DC

## 2020-08-01 NOTE — Discharge Instructions (Addendum)
Take the medications as prescribed  Return for new or worsening symptoms 

## 2020-08-01 NOTE — ED Triage Notes (Signed)
Pt reports she has been coughing for 2 weeks. Productive yellow. Refusing covid test, tested at [facility] and was negative. Pt was given zofran. Reports it feels like it is her chest

## 2020-08-01 NOTE — ED Provider Notes (Signed)
[facility], [place] EMERGENCY DEPARTMENT Provider Note   CSN: [id number] Arrival date & time: 08/01/20  1856    History Chief Complaint  Patient presents with  . Cough    [name] is a 49 y.o. female with no significant past medical history who presents for evaluation of multiple complaints.  Patient states over the last 2 weeks she had upper respiratory complaints.  Has been in multiple family members with similar complaints.  Seen at outside emergency department 07/27/2020, tested negative for Covid.  Patient with cough, congestion, rhinorrhea, intermittent chest pain with coughing, diarrhea.  Was given Zofran with helped with her emesis however she continues to have diarrhea, myalgias and feeling unwell.  Had Covid test which was negative.  She does not want additional Covid test at this time.  She denies fever, chills, headache, lightheadedness, dizziness, facial droop, neck pain, neck stiffness, hemoptysis, shortness of breath, diaphoresis, abdominal pain, dysuria, hematuria, lateral leg swelling, redness or warmth.  Denies additional aggravating or alleviating factors.   History obtained from patient and past medical records.  No interpreter is used.  HPI     Past Medical History:  Diagnosis Date  . Adjustment disorder   . Arthritis    hands, knees  . Asthma   . Endometrial polyp   . Fibromyalgia   . Headache(784.0)   . Pregnancy   . SVD (spontaneous vaginal delivery)    x 1    Patient Active Problem List   Diagnosis Date Noted  . Enteritis   . Acute cholecystitis 05/09/2018  . Symptomatic cholelithiasis 05/09/2018  . Chronic pain syndrome 05/09/2018  . Calculus of gallbladder with acute cholecystitis without obstruction   . Menorrhagia with regular cycle 03/19/2017  . Adjustment disorder with mixed anxiety and depressed mood 02/21/2015  . Dysmenorrhea 01/08/2015  . Dyspareunia 01/08/2015  . Uterus retroversion 01/08/2015  . Sterilization 06/25/2012    Past  Surgical History:  Procedure Laterality Date  . CESAREAN SECTION     x 4  . CHOLECYSTECTOMY N/A 05/11/2018   Procedure: LAPAROSCOPIC CHOLECYSTECTOMY;  Surgeon: Virl Cagey, MD;  Location: [facility];  Service: General;  Laterality: N/A;  . right thumb surgery     pins  . WISDOM TOOTH EXTRACTION       OB History    Gravida  7   Para  6   Term  6   Preterm      AB  1   Living  6     SAB  1   TAB      Ectopic      Multiple      Live Births  1           Family History  Problem Relation Age of Onset  . Hypertension Mother   . Diabetes Mother   . Cancer Mother        lung  . CAD Father   . Heart failure Father   . Heart disease Father   . Seizures Son   . Diabetes Other   . Heart failure Other   . Hypertension Other   . Cancer Other   . Colon cancer Neg Hx   . Colon polyps Neg Hx   . Inflammatory bowel disease Neg Hx     Social History   Tobacco Use  . Smoking status: Current Every Day Smoker    Packs/day: 0.50    Years: 10.00    Pack years: 5.00    Types: Cigarettes  .  Smokeless tobacco: Never Used  . Tobacco comment: 5-6 cigarettes a day  Vaping Use  . Vaping Use: Never used  Substance Use Topics  . Alcohol use: No    Alcohol/week: 0.0 standard drinks  . Drug use: No    Home Medications Prior to Admission medications   Medication Sig Start Date End Date Taking? Authorizing Provider  ondansetron (ZOFRAN ODT) 8 MG disintegrating tablet Take 1 tablet (8 mg total) by mouth every 8 (eight) hours as needed for nausea. 08/11/18  Yes Varney Biles, MD  azithromycin (ZITHROMAX) 250 MG tablet Take 1 tablet (250 mg total) by mouth daily. Take first 2 tablets together, then 1 every day until finished. 08/01/20   Carolanne Mercier A, PA-C  benzonatate (TESSALON) 100 MG capsule Take 1 capsule (100 mg total) by mouth every 8 (eight) hours. 08/01/20   Ariyanah Aguado A, PA-C  EPINEPHrine 0.3 mg/0.3 mL IJ SOAJ injection Inject 0.3 mg into the muscle as  needed for anaphylaxis.  05/12/20   [provider]  predniSONE (DELTASONE) 20 MG tablet Take 1 tablet (20 mg total) by mouth daily for 4 days. 08/01/20 08/05/20  Keelyn Monjaras A, PA-C  omeprazole (PRILOSEC) 40 MG capsule Take 1 capsule (40 mg total) by mouth daily. Patient not taking: Reported on 05/16/2018 05/12/18 08/01/20  Isaac Bliss, Rayford Halsted, MD    Allergies    Aspirin, Nsaids, Latex, Morphine and related, Phenergan [promethazine], Penicillins, and Toradol [ketorolac tromethamine]  Review of Systems   Review of Systems  Constitutional: Positive for activity change, appetite change, chills and fatigue. Negative for diaphoresis, fever and unexpected weight change.  HENT: Positive for congestion, postnasal drip and rhinorrhea. Negative for ear discharge, mouth sores, nosebleeds, sinus pressure, sinus pain, sneezing, sore throat, trouble swallowing and voice change.   Respiratory: Positive for cough. Negative for apnea, choking, chest tightness, shortness of breath, wheezing and stridor.   Cardiovascular: Positive for chest pain (With cough).  Gastrointestinal: Positive for diarrhea and nausea. Negative for abdominal distention, abdominal pain, anal bleeding, blood in stool, constipation, rectal pain and vomiting.  Genitourinary: Negative.   Musculoskeletal: Negative.   Skin: Negative.   Neurological: Negative.   All other systems reviewed and are negative.   Physical Exam Updated Vital Signs BP (!) 160/86 (BP Location: Right Arm)   Pulse 66   Temp 98.2 F (36.8 C) (Oral)   Resp 18   Ht 5\' 6"  (1.676 m)   Wt 65.8 kg   LMP 07/25/2020   SpO2 98%   BMI 23.40 kg/m   Physical Exam Vitals and nursing note reviewed.  Constitutional:      General: She is not in acute distress.    Appearance: She is not ill-appearing, toxic-appearing or diaphoretic.  HENT:     Head: Normocephalic and atraumatic.     Jaw: There is normal jaw occlusion.     Right Ear: Tympanic  membrane, ear canal and external ear normal. There is no impacted cerumen. No hemotympanum. Tympanic membrane is not injected, scarred, perforated, erythematous, retracted or bulging.     Left Ear: Tympanic membrane, ear canal and external ear normal. There is no impacted cerumen. No hemotympanum. Tympanic membrane is not injected, scarred, perforated, erythematous, retracted or bulging.     Ears:     Comments: No Mastoid tenderness.    Nose:     Comments: Clear rhinorrhea and congestion to bilateral nares.  No sinus tenderness.    Mouth/Throat:     Comments: Posterior oropharynx clear.  Mucous membranes moist.  Tonsils without erythema or exudate.  Uvula midline without deviation.  No evidence of PTA or RPA.  No drooling, dysphasia or trismus.  Phonation normal. Neck:     Trachea: Trachea and phonation normal.     Meningeal: Brudzinski's sign and Kernig's sign absent.     Comments: No Neck stiffness or neck rigidity.  No meningismus.  No cervical lymphadenopathy. Cardiovascular:     Comments: No murmurs rubs or gallops. Pulmonary:     Comments: Clear to auscultation bilaterally without wheeze, rhonchi or rales.  No accessory muscle usage.  Able speak in full sentences. Abdominal:     Comments: Soft, nontender without rebound or guarding.  No CVA tenderness.  Musculoskeletal:     Comments: Moves all 4 extremities without difficulty.  Lower extremities without edema, erythema or warmth.  Skin:    Comments: Brisk capillary refill.  No rashes or lesions.  Neurological:     Mental Status: She is alert.     Comments: Ambulatory in department without difficulty.  Cranial nerves II through XII grossly intact.  No facial droop.  No aphasia.     ED Results / Procedures / Treatments   Labs (all labs ordered are listed, but only abnormal results are displayed) Labs Reviewed  COMPREHENSIVE METABOLIC PANEL - Abnormal; Notable for the following components:      Result Value   Glucose, Bld 105 (*)     All other components within normal limits  CBC WITH DIFFERENTIAL/PLATELET  LIPASE, BLOOD  URINALYSIS, ROUTINE W REFLEX MICROSCOPIC  POC URINE PREG, ED  TROPONIN I (HIGH SENSITIVITY)  TROPONIN I (HIGH SENSITIVITY)    EKG None  Radiology DG Chest Portable 1 View  Result Date: 08/01/2020 CLINICAL DATA:  Shortness of breath, cough. EXAM: PORTABLE CHEST 1 VIEW COMPARISON:  October 22, 2019. FINDINGS: The heart size and mediastinal contours are within normal limits. Both lungs are clear. No pneumothorax or pleural effusion is noted. The visualized skeletal structures are unremarkable. IMPRESSION: No active disease. Electronically Signed   By: Marijo Conception M.D.   On: 08/01/2020 12:12   Procedures Procedures (including critical care time)  Medications Ordered in ED Medications  sodium chloride 0.9 % bolus 1,000 mL (0 mLs Intravenous Stopped 08/01/20 1342)  ondansetron (ZOFRAN) injection 4 mg (4 mg Intravenous Given 08/01/20 1217)  dexamethasone (DECADRON) injection 8 mg (8 mg Intravenous Given 08/01/20 1217)   ED Course  I have reviewed the triage vital signs and the nursing notes.  Pertinent labs & imaging results that were available during my care of the patient were reviewed by me and considered in my medical decision making (see chart for details).  49 year old presents for evaluation of respiratory complaints.  She is afebrile, nonseptic, not ill-appearing.  Patient with symptoms x2 weeks.  Multiple family members with similar complaints.  Seen 5 days ago at outside ER.  Had negative UA and chest x-ray.  Heart and lungs clear.  Abdomen soft, nontender.  No recent antibiotics or travel.  Low suspicion for C. difficile colitis as cause of her diarrhea.  No associated abdominal pain.  Has not been able to provide a stool sample while here in the emergency department.  Also with upper respiratory complaints, cough, congestion, rhinorrhea, myalgias.  Had negative Covid test outpatient  does not want repeat.  Plan on labs, imaging and reassess:  Labs and imaging personally reviewed and interpreted:  CBC without leukocytosis, hemoglobin stable Lipase 42 Troponin 5, low suspicion  for atypical ACS, PE, dissection. Metabolic panel with mild hyperglycemia to 105, noticed electrolyte, renal or liver normality DG chest without infiltrates, cardiomegaly, pulmonary edema, pneumothorax EKG without ischemia.  Patient reassessed.  Tolerating p.o. intake without difficulty.  Given symptoms x2 weeks discussed synptomatic management.  She will follow-up outpatient or return for new worsening symptoms.  Patient does not meet the SIRS or Sepsis criteria.  On repeat exam patient does not have a surgical abdomin and there are no peritoneal signs.  No indication of appendicitis, bowel obstruction, bowel perforation, cholecystitis, diverticulitis, PID or ectopic pregnancy.    Patient does state that she has to go pick up her children from school does not have time to wait for urine pregnancy or urinalysis.  This was  Performed 5 days ago at [facility] ED and was negative.  Instructed follow-up with PCP if she has any urinary complaints.  The patient has been appropriately medically screened and/or stabilized in the ED. I have low suspicion for any other emergent medical condition which would require further screening, evaluation or treatment in the ED or require inpatient management.  Patient is hemodynamically stable and in no acute distress.  Patient able to ambulate in department prior to ED.  Evaluation does not show acute pathology that would require ongoing or additional emergent interventions while in the emergency department or further inpatient treatment.  I have discussed the diagnosis with the patient and answered all questions.  Pain is been managed while in the emergency department and patient has no further complaints prior to discharge.  Patient is comfortable with plan discussed in room  and is stable for discharge at this time.  I have discussed strict return precautions for returning to the emergency department.  Patient was encouraged to follow-up with PCP/specialist refer to at discharge.    MDM Rules/Calculators/A&P                          [name] was evaluated in Emergency Department on 08/01/2020 for the symptoms described in the history of present illness. She was evaluated in the context of the global COVID-19 pandemic, which necessitated consideration that the patient might be at risk for infection with the SARS-CoV-2 virus that causes COVID-19. Institutional protocols and algorithms that pertain to the evaluation of patients at risk for COVID-19 are in a state of rapid change based on information released by regulatory bodies including the CDC and federal and state organizations. These policies and algorithms were followed during the patient's care in the ED. Final Clinical Impression(s) / ED Diagnoses Final diagnoses:  Viral URI with cough  Diarrhea, unspecified type    Rx / DC Orders ED Discharge Orders         Ordered    azithromycin (ZITHROMAX) 250 MG tablet  Daily        08/01/20 1328    predniSONE (DELTASONE) 20 MG tablet  Daily        08/01/20 1328    benzonatate (TESSALON) 100 MG capsule  Every 8 hours        08/01/20 1328           Angenette Daily A, PA-C 08/01/20 1350    Fredia Sorrow, MD 08/03/20 2203

## 2020-12-12 ENCOUNTER — Other Ambulatory Visit: Payer: Self-pay | Admitting: Family Medicine

## 2020-12-12 ENCOUNTER — Other Ambulatory Visit (HOSPITAL_COMMUNITY): Payer: Self-pay | Admitting: Family Medicine

## 2020-12-12 DIAGNOSIS — R221 Localized swelling, mass and lump, neck: Secondary | ICD-10-CM

## 2020-12-19 ENCOUNTER — Other Ambulatory Visit: Payer: Self-pay

## 2020-12-19 ENCOUNTER — Ambulatory Visit (HOSPITAL_COMMUNITY)
Admission: RE | Admit: 2020-12-19 | Discharge: 2020-12-19 | Disposition: A | Discharge status: Home / Self Care | Payer: Medicare HMO | Source: Ambulatory Visit | Attending: Family Medicine | Admitting: Family Medicine

## 2020-12-19 DIAGNOSIS — R221 Localized swelling, mass and lump, neck: Secondary | ICD-10-CM | POA: Diagnosis present

## 2020-12-25 ENCOUNTER — Encounter: Payer: Self-pay | Admitting: Internal Medicine

## 2021-01-23 ENCOUNTER — Other Ambulatory Visit (HOSPITAL_COMMUNITY): Payer: Self-pay | Admitting: Family Medicine

## 2021-01-23 ENCOUNTER — Ambulatory Visit: Payer: Medicare HMO | Admitting: Internal Medicine

## 2021-01-23 DIAGNOSIS — Z1231 Encounter for screening mammogram for malignant neoplasm of breast: Secondary | ICD-10-CM

## 2021-01-23 NOTE — Progress Notes (Deleted)
Office Visit Note  Patient: [name]             Date of Birth: 03-Dec-1970           MRN: [id number]             PCP: Olga Coaster, FNP Referring: Olga Coaster, FNP Visit Date: 01/23/2021 Occupation: @GUAROCC @  Subjective:  No chief complaint on file.   History of Present Illness: [name] is a 50 y.o. female here for evaluation for fibromyalgia syndrome.***   Activities of Daily Living:  Patient reports morning stiffness for *** {minute/hour:19697}.   Patient {ACTIONS;DENIES/REPORTS:21021675::"Denies"} nocturnal pain.  Difficulty dressing/grooming: {ACTIONS;DENIES/REPORTS:21021675::"Denies"} Difficulty climbing stairs: {ACTIONS;DENIES/REPORTS:21021675::"Denies"} Difficulty getting out of chair: {ACTIONS;DENIES/REPORTS:21021675::"Denies"} Difficulty using hands for taps, buttons, cutlery, and/or writing: {ACTIONS;DENIES/REPORTS:21021675::"Denies"}  No Rheumatology ROS completed.   PMFS History:  Patient Active Problem List   Diagnosis Date Noted  . Enteritis   . Acute cholecystitis 05/09/2018  . Symptomatic cholelithiasis 05/09/2018  . Chronic pain syndrome 05/09/2018  . Calculus of gallbladder with acute cholecystitis without obstruction   . Menorrhagia with regular cycle 03/19/2017  . Adjustment disorder with mixed anxiety and depressed mood 02/21/2015  . Dysmenorrhea 01/08/2015  . Dyspareunia 01/08/2015  . Uterus retroversion 01/08/2015  . Sterilization 06/25/2012    Past Medical History:  Diagnosis Date  . Adjustment disorder   . Arthritis    hands, knees  . Asthma   . Endometrial polyp   . Fibromyalgia   . Headache(784.0)   . Pregnancy   . SVD (spontaneous vaginal delivery)    x 1    Family History  Problem Relation Age of Onset  . Hypertension Mother   . Diabetes Mother   . Cancer Mother        lung  . CAD Father   . Heart failure Father   . Heart disease Father   . Seizures Son   . Diabetes Other   . Heart failure Other   .  Hypertension Other   . Cancer Other   . Colon cancer Neg Hx   . Colon polyps Neg Hx   . Inflammatory bowel disease Neg Hx    Past Surgical History:  Procedure Laterality Date  . CESAREAN SECTION     x 4  . CHOLECYSTECTOMY N/A 05/11/2018   Procedure: LAPAROSCOPIC CHOLECYSTECTOMY;  Surgeon: Virl Cagey, MD;  Location: [facility];  Service: General;  Laterality: N/A;  . right thumb surgery     pins  . WISDOM TOOTH EXTRACTION     Social History   Social History Narrative  . Not on file   Immunization History  Administered Date(s) Administered  . Influenza Split 06/27/2012  . Pneumococcal Polysaccharide-23 06/28/2012  . Tdap 06/26/2012     Objective: Vital Signs: There were no vitals taken for this visit.   Physical Exam   Musculoskeletal Exam: ***  CDAI Exam: CDAI Score: -- Patient Global: --; Provider Global: -- Swollen: --; Tender: -- Joint Exam 01/23/2021   No joint exam has been documented for this visit   There is currently no information documented on the homunculus. Go to the Rheumatology activity and complete the homunculus joint exam.  Investigation: No additional findings.  Imaging: No results found.  Recent Labs: Lab Results  Component Value Date   WBC 8.3 08/01/2020   HGB 14.7 08/01/2020   PLT 204 08/01/2020   NA 136 08/01/2020   K 4.0 08/01/2020   CL 102 08/01/2020   CO2  27 08/01/2020   GLUCOSE 105 (H) 08/01/2020   BUN 10 08/01/2020   CREATININE 0.77 08/01/2020   BILITOT 0.5 08/01/2020   ALKPHOS 80 08/01/2020   AST 19 08/01/2020   ALT 26 08/01/2020   PROT 7.2 08/01/2020   ALBUMIN 3.9 08/01/2020   CALCIUM 9.1 08/01/2020   GFRAA >60 08/11/2018    Speciality Comments: No specialty comments available.  Procedures:  No procedures performed Allergies: Aspirin, Nsaids, Latex, Morphine and related, Phenergan [promethazine], Penicillins, and Toradol [ketorolac tromethamine]   Assessment / Plan:     Visit Diagnoses: No diagnosis  found.  Orders: No orders of the defined types were placed in this encounter.  No orders of the defined types were placed in this encounter.   Face-to-face time spent with patient was *** minutes. Greater than 50% of time was spent in counseling and coordination of care.  Follow-Up Instructions: No follow-ups on file.   Collier Salina, MD  Note - This record has been created using [employer].  Chart creation errors have been sought, but may not always  have been located. Such creation errors do not reflect on  the standard of medical care.

## 2021-01-25 ENCOUNTER — Other Ambulatory Visit: Payer: Self-pay

## 2021-01-25 ENCOUNTER — Encounter (HOSPITAL_COMMUNITY): Payer: Self-pay

## 2021-01-25 ENCOUNTER — Ambulatory Visit (HOSPITAL_COMMUNITY)
Admission: RE | Admit: 2021-01-25 | Discharge: 2021-01-25 | Disposition: A | Discharge status: Home / Self Care | Payer: Medicare HMO | Source: Ambulatory Visit | Attending: Family Medicine | Admitting: Family Medicine

## 2021-01-25 DIAGNOSIS — Z1231 Encounter for screening mammogram for malignant neoplasm of breast: Secondary | ICD-10-CM | POA: Insufficient documentation

## 2021-02-02 NOTE — Progress Notes (Deleted)
Referring Provider: Olga Coaster, FNP Primary Care Physician:  Olga Coaster, FNP Primary Gastroenterologist:  Dr. Abbey Chatters  No chief complaint on file.   HPI:   [name] is a 50 y.o. female presenting today at the request of [place], [place] Dragon, FNP for consult colonoscopy and abdominal pain.   We saw patient in August 2019 during hospitalization for acute cholecystitis.  GI was consulted due to enteritis seen on CT involving jejunum to TI.  Notably, she also had prior CT findings of enteritis involving mid and distal small bowel, including the TI (June 2019).  She denied any chronic history of diarrhea, rectal bleeding, or family history of IBD.  No prior colonoscopy or EGD.  She did not want to proceed with colonoscopy inpatient.  Patient no showed to her follow-up appointment 06/22/2018.  Today:    Past Medical History:  Diagnosis Date  . Adjustment disorder   . Arthritis    hands, knees  . Asthma   . Endometrial polyp   . Fibromyalgia   . Headache(784.0)   . Pregnancy   . SVD (spontaneous vaginal delivery)    x 1    Past Surgical History:  Procedure Laterality Date  . CESAREAN SECTION     x 4  . CHOLECYSTECTOMY N/A 05/11/2018   Procedure: LAPAROSCOPIC CHOLECYSTECTOMY;  Surgeon: Virl Cagey, MD;  Location: [facility];  Service: General;  Laterality: N/A;  . right thumb surgery     pins  . WISDOM TOOTH EXTRACTION      Current Outpatient Medications  Medication Sig Dispense Refill  . azithromycin (ZITHROMAX) 250 MG tablet Take 1 tablet (250 mg total) by mouth daily. Take first 2 tablets together, then 1 every day until finished. 6 tablet 0  . benzonatate (TESSALON) 100 MG capsule Take 1 capsule (100 mg total) by mouth every 8 (eight) hours. 21 capsule 0  . EPINEPHrine 0.3 mg/0.3 mL IJ SOAJ injection Inject 0.3 mg into the muscle as needed for anaphylaxis.     Marland Kitchen ondansetron (ZOFRAN ODT) 8 MG disintegrating tablet Take 1 tablet (8 mg total) by mouth every 8 (eight)  hours as needed for nausea. 20 tablet 0   No current facility-administered medications for this visit.    Allergies as of 02/04/2021 - Review Complete 08/01/2020  Allergen Reaction Noted  . Aspirin Shortness Of Breath 08/20/2011  . Nsaids Shortness Of Breath 03/08/2017  . Latex Swelling 05/28/2012  . Morphine and related Itching 02/22/2018  . Phenergan [promethazine] Other (See Comments) 02/02/2015  . Penicillins Rash and Other (See Comments) 08/20/2011  . Toradol [ketorolac tromethamine] Nausea And Vomiting 03/14/2018    Family History  Problem Relation Age of Onset  . Hypertension Mother   . Diabetes Mother   . Cancer Mother        lung  . Breast cancer Mother   . CAD Father   . Heart failure Father   . Heart disease Father   . Seizures Son   . Diabetes Other   . Heart failure Other   . Hypertension Other   . Cancer Other   . Colon cancer Neg Hx   . Colon polyps Neg Hx   . Inflammatory bowel disease Neg Hx     Social History   Socioeconomic History  . Marital status: Single    Spouse name: Not on file  . Number of children: Not on file  . Years of education: Not on file  . Highest education level: Not  on file  Occupational History  . Not on file  Tobacco Use  . Smoking status: Current Every Day Smoker    Packs/day: 0.50    Years: 10.00    Pack years: 5.00    Types: Cigarettes  . Smokeless tobacco: Never Used  . Tobacco comment: 5-6 cigarettes a day  Vaping Use  . Vaping Use: Never used  Substance and Sexual Activity  . Alcohol use: No    Alcohol/week: 0.0 standard drinks  . Drug use: No  . Sexual activity: Yes    Birth control/protection: None  Other Topics Concern  . Not on file  Social History Narrative  . Not on file   Social Determinants of Health   Financial Resource Strain: Not on file  Food Insecurity: Not on file  Transportation Needs: Not on file  Physical Activity: Not on file  Stress: Not on file  Social Connections: Not on file   Intimate Partner Violence: Not on file    Review of Systems: Gen: Denies any fever, chills, fatigue, weight loss, lack of appetite.  CV: Denies chest pain, heart palpitations, peripheral edema, syncope.  Resp: Denies shortness of breath at rest or with exertion. Denies wheezing or cough.  GI: Denies dysphagia or odynophagia. Denies jaundice, hematemesis, fecal incontinence. GU : Denies urinary burning, urinary frequency, urinary hesitancy MS: Denies joint pain, muscle weakness, cramps, or limitation of movement.  Derm: Denies rash, itching, dry skin Psych: Denies depression, anxiety, memory loss, and confusion Heme: Denies bruising, bleeding, and enlarged lymph nodes.  Physical Exam: There were no vitals taken for this visit. General:   Alert and oriented. Pleasant and cooperative. Well-nourished and well-developed.  Head:  Normocephalic and atraumatic. Eyes:  Without icterus, sclera clear and conjunctiva pink.  Ears:  Normal auditory acuity. Nose:  No deformity, discharge,  or lesions. Mouth:  No deformity or lesions, oral mucosa pink.  Neck:  Supple, without mass or thyromegaly. Lungs:  Clear to auscultation bilaterally. No wheezes, rales, or rhonchi. No distress.  Heart:  S1, S2 present without murmurs appreciated.  Abdomen:  +BS, soft, non-tender and non-distended. No HSM noted. No guarding or rebound. No masses appreciated.  Rectal:  Deferred  Msk:  Symmetrical without gross deformities. Normal posture. Pulses:  Normal pulses noted. Extremities:  Without clubbing or edema. Neurologic:  Alert and  oriented x4;  grossly normal neurologically. Skin:  Intact without significant lesions or rashes. Cervical Nodes:  No significant cervical adenopathy. Psych:  Alert and cooperative. Normal mood and affect.

## 2021-02-04 ENCOUNTER — Ambulatory Visit: Payer: Medicare HMO | Admitting: Gastroenterology

## 2021-03-21 ENCOUNTER — Encounter: Payer: Self-pay | Admitting: Internal Medicine

## 2021-04-23 LAB — TSH: TSH: 0.6 (ref 0.41–5.90)

## 2021-04-26 ENCOUNTER — Encounter (HOSPITAL_COMMUNITY): Payer: Self-pay | Admitting: *Deleted

## 2021-04-26 ENCOUNTER — Emergency Department (HOSPITAL_COMMUNITY)
Admission: EM | Admit: 2021-04-26 | Discharge: 2021-04-27 | Disposition: A | Discharge status: Home / Self Care | Payer: Medicare HMO | Attending: Emergency Medicine | Admitting: Emergency Medicine

## 2021-04-26 ENCOUNTER — Other Ambulatory Visit: Payer: Self-pay

## 2021-04-26 DIAGNOSIS — Z5321 Procedure and treatment not carried out due to patient leaving prior to being seen by health care provider: Secondary | ICD-10-CM | POA: Insufficient documentation

## 2021-04-26 DIAGNOSIS — K0889 Other specified disorders of teeth and supporting structures: Secondary | ICD-10-CM | POA: Diagnosis not present

## 2021-04-26 NOTE — ED Triage Notes (Signed)
Dental pain right upper jaw

## 2021-05-06 ENCOUNTER — Ambulatory Visit: Payer: Medicare HMO | Admitting: Gastroenterology

## 2021-06-26 ENCOUNTER — Telehealth: Payer: Self-pay

## 2021-06-26 NOTE — Telephone Encounter (Signed)
Received referral on pt. Called on 8/17 VM full and called again 9/21 VM full. Closed referral

## 2021-07-08 ENCOUNTER — Ambulatory Visit: Payer: Medicare HMO | Admitting: Neurology

## 2021-07-31 NOTE — Progress Notes (Deleted)
Referring Provider:*** Primary Care Physician:  Olga Coaster, FNP Primary Gastroenterologist:  Dr. Rayne Du chief complaint on file.   HPI:   [name] is a 50 y.o. female presenting today at the request of Bucio, [place] Dragon, FNP for consult colonoscopy.  Recommended office visit due to abdominal pain.   We previously saw patient during hospitalization in August 2019 due to enteritis noted on CT involving jejunum to TI.  Prior CT in June also with enteritis involving mid and distal small bowel including TI.  Dr. Oneida Alar reviewed images with Dr. Thornton Papas, concern for IBD.  Recommended outpatient colonoscopy.  Labs including hep B serologies, TB Gold assay, TPMT was requested.  Colonoscopy normal labs were completed, patient no-show to follow-up.   Today:    Past Medical History:  Diagnosis Date   Adjustment disorder    Arthritis    hands, knees   Asthma    Endometrial polyp    Fibromyalgia    Headache(784.0)    Pregnancy    SVD (spontaneous vaginal delivery)    x 1    Past Surgical History:  Procedure Laterality Date   CESAREAN SECTION     x 4   CHOLECYSTECTOMY N/A 05/11/2018   Procedure: LAPAROSCOPIC CHOLECYSTECTOMY;  Surgeon: Virl Cagey, MD;  Location: [facility];  Service: General;  Laterality: N/A;   right thumb surgery     pins   WISDOM TOOTH EXTRACTION      Current Outpatient Medications  Medication Sig Dispense Refill   azithromycin (ZITHROMAX) 250 MG tablet Take 1 tablet (250 mg total) by mouth daily. Take first 2 tablets together, then 1 every day until finished. 6 tablet 0   benzonatate (TESSALON) 100 MG capsule Take 1 capsule (100 mg total) by mouth every 8 (eight) hours. 21 capsule 0   EPINEPHrine 0.3 mg/0.3 mL IJ SOAJ injection Inject 0.3 mg into the muscle as needed for anaphylaxis.      ondansetron (ZOFRAN ODT) 8 MG disintegrating tablet Take 1 tablet (8 mg total) by mouth every 8 (eight) hours as needed for nausea. 20 tablet 0   No current  facility-administered medications for this visit.    Allergies as of 08/01/2021 - Review Complete 04/26/2021  Allergen Reaction Noted   Aspirin Shortness Of Breath 08/20/2011   Nsaids Shortness Of Breath 03/08/2017   Latex Swelling 05/28/2012   Morphine and related Itching 02/22/2018   Phenergan [promethazine] Other (See Comments) 02/02/2015   Penicillins Rash and Other (See Comments) 08/20/2011   Toradol [ketorolac tromethamine] Nausea And Vomiting 03/14/2018    Family History  Problem Relation Age of Onset   Hypertension Mother    Diabetes Mother    Cancer Mother        lung   Breast cancer Mother    CAD Father    Heart failure Father    Heart disease Father    Seizures Son    Diabetes Other    Heart failure Other    Hypertension Other    Cancer Other    Colon cancer Neg Hx    Colon polyps Neg Hx    Inflammatory bowel disease Neg Hx     Social History   Socioeconomic History   Marital status: Single    Spouse name: Not on file   Number of children: Not on file   Years of education: Not on file   Highest education level: Not on file  Occupational History   Not on file  Tobacco  Use   Smoking status: Every Day    Packs/day: 0.50    Years: 10.00    Pack years: 5.00    Types: Cigarettes   Smokeless tobacco: Never   Tobacco comments:    5-6 cigarettes a day  Vaping Use   Vaping Use: Never used  Substance and Sexual Activity   Alcohol use: No    Alcohol/week: 0.0 standard drinks   Drug use: No   Sexual activity: Yes    Birth control/protection: None  Other Topics Concern   Not on file  Social History Narrative   Not on file   Social Determinants of Health   Financial Resource Strain: Not on file  Food Insecurity: Not on file  Transportation Needs: Not on file  Physical Activity: Not on file  Stress: Not on file  Social Connections: Not on file  Intimate Partner Violence: Not on file    Review of Systems: Gen: Denies any fever, chills, fatigue,  weight loss, lack of appetite.  CV: Denies chest pain, heart palpitations, peripheral edema, syncope.  Resp: Denies shortness of breath at rest or with exertion. Denies wheezing or cough.  GI: Denies dysphagia or odynophagia. Denies jaundice, hematemesis, fecal incontinence. GU : Denies urinary burning, urinary frequency, urinary hesitancy MS: Denies joint pain, muscle weakness, cramps, or limitation of movement.  Derm: Denies rash, itching, dry skin Psych: Denies depression, anxiety, memory loss, and confusion Heme: Denies bruising, bleeding, and enlarged lymph nodes.  Physical Exam: There were no vitals taken for this visit. General:   Alert and oriented. Pleasant and cooperative. Well-nourished and well-developed.  Head:  Normocephalic and atraumatic. Eyes:  Without icterus, sclera clear and conjunctiva pink.  Ears:  Normal auditory acuity. Nose:  No deformity, discharge,  or lesions. Mouth:  No deformity or lesions, oral mucosa pink.  Neck:  Supple, without mass or thyromegaly. Lungs:  Clear to auscultation bilaterally. No wheezes, rales, or rhonchi. No distress.  Heart:  S1, S2 present without murmurs appreciated.  Abdomen:  +BS, soft, non-tender and non-distended. No HSM noted. No guarding or rebound. No masses appreciated.  Rectal:  Deferred  Msk:  Symmetrical without gross deformities. Normal posture. Pulses:  Normal pulses noted. Extremities:  Without clubbing or edema. Neurologic:  Alert and  oriented x4;  grossly normal neurologically. Skin:  Intact without significant lesions or rashes. Cervical Nodes:  No significant cervical adenopathy. Psych:  Alert and cooperative. Normal mood and affect.

## 2021-08-01 ENCOUNTER — Ambulatory Visit: Payer: Medicare HMO | Admitting: Gastroenterology

## 2021-08-08 ENCOUNTER — Encounter: Payer: Self-pay | Admitting: Neurology

## 2021-08-08 ENCOUNTER — Ambulatory Visit: Payer: Medicare HMO | Admitting: Neurology

## 2021-08-08 VITALS — BP 150/84 | HR 66 | Ht 66.5 in | Wt 193.0 lb

## 2021-08-08 DIAGNOSIS — M797 Fibromyalgia: Secondary | ICD-10-CM | POA: Diagnosis not present

## 2021-08-08 DIAGNOSIS — G44221 Chronic tension-type headache, intractable: Secondary | ICD-10-CM

## 2021-08-08 DIAGNOSIS — F419 Anxiety disorder, unspecified: Secondary | ICD-10-CM | POA: Diagnosis not present

## 2021-08-08 MED ORDER — BUTALBITAL-APAP-CAFFEINE 50-325-40 MG PO TABS
1.0000 | ORAL_TABLET | Freq: Four times a day (QID) | ORAL | 1 refills | Status: DC | PRN
Start: 2021-08-08 — End: 2021-12-12

## 2021-08-08 MED ORDER — DULOXETINE HCL 30 MG PO CPEP: 30.0000 mg | ORAL_CAPSULE | Freq: Every day | ORAL | 1 refills | Status: DC
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
## 2021-08-08 NOTE — Patient Instructions (Signed)
Start with Cymbalta 30 mg daily  Fioricet as needed for the headaches  MRI Brain without contrast  Follow up in 3 months

## 2021-08-21 ENCOUNTER — Other Ambulatory Visit: Payer: Self-pay

## 2021-08-21 ENCOUNTER — Encounter: Payer: Self-pay | Admitting: Nurse Practitioner

## 2021-08-21 ENCOUNTER — Ambulatory Visit (INDEPENDENT_AMBULATORY_CARE_PROVIDER_SITE_OTHER): Payer: Medicare HMO | Admitting: Nurse Practitioner

## 2021-08-21 VITALS — BP 144/75 | HR 76 | Ht 66.0 in | Wt 194.8 lb

## 2021-08-21 DIAGNOSIS — E041 Nontoxic single thyroid nodule: Secondary | ICD-10-CM

## 2021-08-21 NOTE — Progress Notes (Signed)
Endocrinology Consult Note 08/21/21    SUBJECTIVE:  Past Medical History:  Diagnosis Date   Adjustment disorder    Arthritis    hands, knees   Asthma    Endometrial polyp    Fibromyalgia    Headache(784.0)    Pregnancy    SVD (spontaneous vaginal delivery)    x 1     Past Surgical History:  Procedure Laterality Date   CESAREAN SECTION     x 4   CHOLECYSTECTOMY N/A 05/11/2018   Procedure: LAPAROSCOPIC CHOLECYSTECTOMY;  Surgeon: Virl Cagey, MD;  Location: [facility];  Service: General;  Laterality: N/A;   right thumb surgery     pins   WISDOM TOOTH EXTRACTION       Current Outpatient Medications on File Prior to Visit  Medication Sig Dispense Refill   atorvastatin (LIPITOR) 10 MG tablet 1 tablet     benzocaine (ORAJEL) 10 % mucosal gel Apply to the mouth or throat Three (3) times a day as needed for pain.     budesonide-formoterol (SYMBICORT) 80-4.5 MCG/ACT inhaler 2 puffs     butalbital-acetaminophen-caffeine (FIORICET) 50-325-40 MG tablet Take 1 tablet by mouth every 6 (six) hours as needed for headache. 30 tablet 1   cyclobenzaprine (FLEXERIL) 10 MG tablet 1 tablet three times daily as needed     diazepam (VALIUM) 10 MG tablet Take 10 mg by mouth daily as needed.     DULoxetine (CYMBALTA) 30 MG capsule Take 1 capsule (30 mg total) by mouth daily. 90 capsule 1   EPINEPHrine 0.3 mg/0.3 mL IJ SOAJ injection Inject 0.3 mg into the muscle as needed for anaphylaxis.      HYDROcodone-acetaminophen (NORCO/VICODIN) 5-325 MG tablet hydrocodone 5 mg-acetaminophen 325 mg tablet  TAKE (1) TABLET THREE TIMES DAILY AS DIRECTED.     ibuprofen (ADVIL) 800 MG tablet TAKE 1 TABLET THREE TIMES DAILY WITH FOOD.     lidocaine (XYLOCAINE) 2 % solution Lidocaine Viscous 2 % mucosal solution     PARoxetine Mesylate 7.5 MG CAPS 1 capsule at bedtime     sertraline (ZOLOFT) 25 MG tablet 1 tablet     No current facility-administered medications on file prior to visit.     HPI  Jamie Andalon  Farley is a 50 y.o.-year-old female, referred by her PCP, Dr. Rubin Payor, for evaluation for thyroid nodule.  This was an incidental finding during imaging for a separate issue.  Therefore thyroid ultrasound was ordered to investigate further.  Thyroid U/S: from 05/16/21 shows mildly heterogenous thyroid echotexture with 0.6 cm solid nodule in the right mid thyroid gland (TIRADS 4) does not meet criteria for biopsy of dedicated follow up.  Pt denies - feeling nodules in neck - hoarseness - choking - SOB with lying down  I reviewed pt's thyroid tests: Lab Results  Component Value Date   TSH 0.60 04/16/2021   TSH 0.736 12/25/2015     Pt c/o: - fatigue - tremors - anxiety/depression- alternating - generalized body aches  No FH of thyroid ds. No FH of thyroid cancer. No h/o radiation tx to head or neck.  No seaweed or kelp. No recent contrast studies. No steroid use. No herbal supplements. No Biotin supplements or Hair, Skin and Nails vitamins.  Pt also has a history of fibromyalgia, cigarette smoking, HTN, HLD, memory loss, anxiety/depression, asthma.  She also reports vague history of high calcium levels in the blood (though no labs to confirm this were in the referral package).  Review of  systems  Constitutional: + Minimally fluctuating body weight,  current Body mass index is 31.44 kg/m. , + fatigue, no subjective hyperthermia, no subjective hypothermia Eyes: no blurry vision, no xerophthalmia ENT: + sore throat when swallowing, no nodules palpated in throat, no dysphagia/odynophagia, no hoarseness Cardiovascular: no chest pain, no shortness of breath, no palpitations, no leg swelling Respiratory: no cough, no shortness of breath Gastrointestinal: no nausea/vomiting/diarrhea Musculoskeletal: generalized aches and pains worse on left side of body Skin: no rashes, no hyperemia Neurological: + tremors, no numbness, no tingling, no dizziness Psychiatric: no depression, no  anxiety   ---------------------------------------------------------------------------------------------------------------------------------- OBJECTIVE:  BP (!) 144/75   Pulse 76   Ht 5\' 6"  (1.676 m)   Wt 194 lb 12.8 oz (88.4 kg)   BMI 31.44 kg/m  Wt Readings from Last 3 Encounters:  08/21/21 194 lb 12.8 oz (88.4 kg)  08/08/21 193 lb (87.5 kg)  08/01/20 145 lb (65.8 kg)   BP Readings from Last 3 Encounters:  08/21/21 (!) 144/75  08/08/21 (!) 150/84  04/26/21 (!) 161/94    Physical Exam- Limited  Constitutional:  Body mass index is 31.44 kg/m. , not in acute distress, normal state of mind Eyes:  EOMI, no exophthalmos Neck: Supple Thyroid: No gross goiter Cardiovascular: RRR, no murmurs, rubs, or gallops, no edema Respiratory: Adequate breathing efforts, no crackles, rales, rhonchi, or wheezing Musculoskeletal: no gross deformities, strength intact in all four extremities, no gross restriction of joint movements Skin:  no rashes, no hyperemia Neurological: mild tremor with outstretched hands L>R   CMP     Component Value Date/Time   NA 136 08/01/2020 1221   K 4.0 08/01/2020 1221   CL 102 08/01/2020 1221   CO2 27 08/01/2020 1221   GLUCOSE 105 (H) 08/01/2020 1221   BUN 10 08/01/2020 1221   CREATININE 0.77 08/01/2020 1221   CALCIUM 9.1 08/01/2020 1221   PROT 7.2 08/01/2020 1221   ALBUMIN 3.9 08/01/2020 1221   AST 19 08/01/2020 1221   ALT 26 08/01/2020 1221   ALKPHOS 80 08/01/2020 1221   BILITOT 0.5 08/01/2020 1221   GFRNONAA >60 08/01/2020 1221   GFRAA >60 08/11/2018 1628     Thyroid Ultrasound from 05/16/21 Clinical Data: Incidental on other study  Technique: Ultrasound examination of the thyroid gland and adjacent soft tissues was performed.  Comparison: None  Findings:  Parenchymal Echotexture: Mildly heterogenous Isthmus: 0.2 cm Right Lobe: 5 x 1.3 x 2.6 cm Left Lobe: 4.7 x 1.1 x 1.7 cm  Nodule #1: Location: Right Mid Thyroid Size: 0.6 x 0.5 x 0.3  cm Composition: Solid or almost completely solid (2) Echogenicity: Hypoechoic (2) Shape: Not taller than wide Margins: Smooth Echogenic foci: None  TIRADS category 4  Given size and appearance, this nodule does NOT meet criteria for biopsy or dedicated follow up.  Impression: There is a single nodule in the right thyroid lobe measuring up to 0.6 cm which does not meet criteria for biopsy or dedicated follow up.  No other discrete nodules identified. ----------------------------------------------------------------------------------------------------------------------------  ASSESSMENT / PLAN: 1. Thyroid nodule  - I reviewed the images of her thyroid ultrasound along with the patient. I pointed out that the single nodule is very small favoring benignity.  Pt does not have a thyroid cancer family history or a personal history of RxTx to head/neck. All these would also favor benignity.    She did have TSH checked in July which was low end of normal.  Will repeat more complete thyroid labs today to  rule out the thyroids involvement as the main cause of her vague symptoms.  I also am checking a CMP to assess calcium level given her report of high calcium levels previously.  Will have patient return in 10 days to discuss results and develop treatment plan.     I spent 45 minutes in the care of the patient today including review of labs from [place], Lipids, Thyroid Function, Hematology (current and previous including abstractions from other facilities); face-to-face time discussing  her medications doses, her options of short and long term treatment based on the latest standards of care / guidelines;  discussion about incorporating lifestyle medicine;  and documenting the encounter.    Please refer to Patient Instructions for Blood Glucose Monitoring and Insulin/Medications Dosing Guide"  in media tab for additional information. Please  also refer to " Patient Self Inventory" in the Media  tab for  reviewed elements of pertinent patient history.  [name] participated in the discussions, expressed understanding, and voiced agreement with the above plans.  All questions were answered to her satisfaction. she is encouraged to contact clinic should she have any questions or concerns prior to her return visit.    FOLLOW UP PLAN:  Return in about 10 days (around 08/31/2021) for Thyroid follow up, Previsit labs.     Rayetta Pigg, [facility] [address] [place], [place] [address] Phone: [phone] Fax: [phone]

## 2021-08-21 NOTE — Patient Instructions (Signed)
Thyroid Nodule A thyroid nodule is an isolated growth of thyroid cells that forms a lump in your thyroid gland. The thyroid gland is a butterfly-shaped gland. It is found in the lower front of your neck. This gland sends chemical messengers (hormones) through your blood to all parts of your body. These hormones are important in regulating your body temperature and helping your body to use energy. Thyroid nodules are common. Most are not cancerous (benign). You may have one nodule or several nodules. Different types of thyroid nodules include nodules that: Grow and fill with fluid (thyroid cysts). Produce too much thyroid hormone (hot nodules or hyperthyroid). Produce no thyroid hormone (cold nodules or hypothyroid). Form from cancer cells (thyroid cancers). What are the causes? In most cases, the cause of this condition is not known. What increases the risk? The following factors may make you more likely to develop this condition. Age. Thyroid nodules become more common in people who are older than 50 years of age. Gender. Benign thyroid nodules are more common in women. Cancerous (malignant) thyroid nodules are more common in men. A family history that includes: Thyroid nodules. Pheochromocytoma. Thyroid carcinoma. Hyperparathyroidism. Certain kinds of thyroid diseases, such as Hashimoto's thyroiditis. Lack of iodine in your diet. A history of head and neck radiation, such as from previous cancer treatment. What are the signs or symptoms? In many cases, there are no symptoms. If you have symptoms, they may include: A lump in your lower neck. Feeling a lump or tickle in your throat. Pain in your neck, jaw, or ear. Having trouble swallowing. Hot nodules may cause symptoms that include: Weight loss. Warm, flushed skin. Feeling hot. Feeling nervous. A racing heartbeat. Cold nodules may cause symptoms that include: Weight gain. Dry skin. Brittle hair. This may also occur with hair  loss. Feeling cold. Fatigue. Thyroid cancer nodules may cause symptoms that include: Hard nodules that feel stuck to the thyroid gland. Hoarseness. Lumps in the glands near your thyroid (lymph nodes). How is this diagnosed? A thyroid nodule may be felt by your health care provider during a physical exam. This condition may also be diagnosed based on your symptoms. You may also have tests, including: An ultrasound. This may be done to confirm the diagnosis. A biopsy. This involves taking a sample from the nodule and looking at it under a microscope. Blood tests to make sure that your thyroid is working properly. A thyroid scan. This test uses a radioactive tracer injected into a vein to create an image of the thyroid gland on a computer screen. Imaging tests such as MRI or CT scan. These may be done if: Your nodule is large. Your nodule is blocking your airway. Cancer is suspected. How is this treated? Treatment depends on the cause and size of your nodule or nodules. If the nodule is benign, treatment may not be necessary. Your health care provider may monitor the nodule to see if it goes away without treatment. If the nodule continues to grow, is cancerous, or does not go away, treatment may be needed. Treatment may include: Having a cystic nodule drained with a needle. Ablation therapy. In this treatment, alcohol is injected into the area of the nodule to destroy the cells. Ablation with heat (thermal ablation) may also be used. Radioactive iodine. In this treatment, radioactive iodine is given as a pill or liquid that you drink. This substance causes the thyroid nodule to shrink. Surgery to remove the nodule. Part or all of your thyroid gland may  need to be removed as well. Medicines. Follow these instructions at home: Pay attention to any changes in your nodule. Take over-the-counter and prescription medicines only as told by your health care provider. Keep all follow-up visits as told  by your health care provider. This is important. Contact a health care provider if: Your voice changes. You have trouble swallowing. You have pain in your neck, ear, or jaw that is getting worse. Your nodule gets bigger. Your nodule starts to make it harder for you to breathe. Your muscles look like they are shrinking (muscle wasting). Get help right away if: You have chest pain. There is a loss of consciousness. You have a sudden fever. You feel confused. You are seeing or hearing things that other people do not see or hear (having hallucinations). You feel very weak. You have mood swings. You feel very restless. You feel suddenly nauseous or throw up. You suddenly have diarrhea. Summary A thyroid nodule is an isolated growth of thyroid cells that forms a lump in your thyroid gland. Thyroid nodules are common. Most are not cancerous (benign). You may have one nodule or several nodules. Treatment depends on the cause and size of your nodule or nodules. If the nodule is benign, treatment may not be necessary. Your health care provider may monitor the nodule to see if it goes away without treatment. If the nodule continues to grow, is cancerous, or does not go away, treatment may be needed. This information is not intended to replace advice given to you by your health care provider. Make sure you discuss any questions you have with your health care provider. Document Revised: 05/07/2018 Document Reviewed: 05/10/2018 Elsevier Patient Education  [place].

## 2021-08-22 LAB — COMPREHENSIVE METABOLIC PANEL
ALT: 13 IU/L (ref 0–32)
AST: 13 IU/L (ref 0–40)
Albumin/Globulin Ratio: 1.8 (ref 1.2–2.2)
Albumin: 4.4 g/dL (ref 3.8–4.8)
Alkaline Phosphatase: 102 IU/L (ref 44–121)
BUN/Creatinine Ratio: 10 (ref 9–23)
BUN: 8 mg/dL (ref 6–24)
Bilirubin Total: <0.2 mg/dL (ref 0.0–1.2)
CO2: 21 mmol/L (ref 20–29)
Calcium: 9 mg/dL (ref 8.7–10.2)
Chloride: 101 mmol/L (ref 96–106)
Creatinine, Ser: 0.79 mg/dL (ref 0.57–1.00)
Globulin, Total: 2.4 g/dL (ref 1.5–4.5)
Glucose: 105 mg/dL — ABNORMAL HIGH (ref 70–99)
Potassium: 4.7 mmol/L (ref 3.5–5.2)
Sodium: 139 mmol/L (ref 134–144)
Total Protein: 6.8 g/dL (ref 6.0–8.5)
eGFR: 91 mL/min/{1.73_m2} (ref >59)

## 2021-08-22 LAB — TSH: TSH: 0.763 u[IU]/mL (ref 0.450–4.500)

## 2021-08-22 LAB — T3, FREE: T3, Free: 2.9 pg/mL (ref 2.0–4.4)

## 2021-08-22 LAB — THYROGLOBULIN ANTIBODY: Thyroglobulin Antibody: <1 IU/mL (ref 0.0–0.9)

## 2021-08-22 LAB — THYROID PEROXIDASE ANTIBODY: Thyroperoxidase Ab SerPl-aCnc: <9 IU/mL (ref 0–34)

## 2021-08-22 LAB — T4, FREE: Free T4: 1.09 ng/dL (ref 0.82–1.77)

## 2021-08-22 LAB — VITAMIN D 25 HYDROXY (VIT D DEFICIENCY, FRACTURES): Vit D, 25-Hydroxy: 26.7 ng/mL — ABNORMAL LOW (ref 30.0–100.0)

## 2021-09-06 ENCOUNTER — Ambulatory Visit: Payer: Medicare HMO | Admitting: Nurse Practitioner

## 2021-10-04 ENCOUNTER — Encounter: Payer: Medicare HMO | Admitting: Adult Health

## 2021-10-14 ENCOUNTER — Encounter: Payer: Medicare HMO | Admitting: Adult Health

## 2021-10-23 ENCOUNTER — Encounter: Payer: Medicare HMO | Admitting: Adult Health

## 2021-11-26 ENCOUNTER — Ambulatory Visit: Payer: Medicare HMO | Admitting: Neurology

## 2021-12-12 ENCOUNTER — Encounter: Payer: Self-pay | Admitting: Neurology

## 2021-12-12 ENCOUNTER — Ambulatory Visit: Payer: Medicare HMO | Admitting: Neurology

## 2021-12-12 VITALS — BP 130/86 | HR 87 | Ht 66.0 in | Wt 193.5 lb

## 2021-12-12 DIAGNOSIS — G44221 Chronic tension-type headache, intractable: Secondary | ICD-10-CM

## 2021-12-12 DIAGNOSIS — M797 Fibromyalgia: Secondary | ICD-10-CM | POA: Diagnosis not present

## 2021-12-12 DIAGNOSIS — F419 Anxiety disorder, unspecified: Secondary | ICD-10-CM

## 2021-12-12 MED ORDER — BUTALBITAL-APAP-CAFFEINE 50-325-40 MG PO TABS: 1.0000 | ORAL_TABLET | Freq: Four times a day (QID) | ORAL | 1 refills | Status: DC | PRN

## 2021-12-12 MED ORDER — DULOXETINE HCL 30 MG PO CPEP: 30.0000 mg | ORAL_CAPSULE | Freq: Every day | ORAL | 3 refills | Status: DC

## 2021-12-12 NOTE — Patient Instructions (Signed)
Continue Cymbalta for headache prevention and fibromyalgia ?Continue with Fioricet as needed for headache ?Continue other medication and follow-up with your primary care doctor ?Return in 1 year for follow-up ?
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
## 2022-01-02 ENCOUNTER — Other Ambulatory Visit (HOSPITAL_COMMUNITY)
Admission: RE | Admit: 2022-01-02 | Discharge: 2022-01-02 | Disposition: A | Discharge status: Home / Self Care | Payer: Medicare HMO | Source: Ambulatory Visit | Attending: Adult Health | Admitting: Adult Health

## 2022-01-02 ENCOUNTER — Ambulatory Visit (INDEPENDENT_AMBULATORY_CARE_PROVIDER_SITE_OTHER): Payer: Medicare HMO | Admitting: Adult Health

## 2022-01-02 ENCOUNTER — Encounter: Payer: Self-pay | Admitting: Adult Health

## 2022-01-02 VITALS — BP 134/84 | HR 73 | Ht 67.0 in | Wt 192.8 lb

## 2022-01-02 DIAGNOSIS — Z1151 Encounter for screening for human papillomavirus (HPV): Secondary | ICD-10-CM | POA: Insufficient documentation

## 2022-01-02 DIAGNOSIS — Z01419 Encounter for gynecological examination (general) (routine) without abnormal findings: Secondary | ICD-10-CM | POA: Diagnosis present

## 2022-01-02 DIAGNOSIS — N858 Other specified noninflammatory disorders of uterus: Secondary | ICD-10-CM | POA: Diagnosis not present

## 2022-01-02 DIAGNOSIS — Z124 Encounter for screening for malignant neoplasm of cervix: Secondary | ICD-10-CM

## 2022-01-02 DIAGNOSIS — Z8742 Personal history of other diseases of the female genital tract: Secondary | ICD-10-CM

## 2022-01-02 NOTE — Progress Notes (Signed)
?Subjective:  ?  ? Patient ID: [name], female   DOB: 1971-03-28, 51 y.o.   MRN: [id number] ? ?HPI ?Corynne is a 51 year old white female, single, [id number], in for period issues,had had heavy bleeding and started Micronor from PCP and has not had any bleeding, even is misses pill, no bleeding. She had [place] in [place] in December that showed mass she says. ?And she needs a pap, last in 2018. ?US showed: ?1. Poorly defined hypoechoic mass within the uterine fundus and  ?upper uterine segment measuring 3.3 cm indeterminate for submucosal  ?fibroid versus endometrial mass. ) Consider further evaluation with  ?sonohysterogram for confirmation prior to hysteroscopy. Endometrial  ?sampling should also be considered if patient is at high risk for  ?endometrial carcinoma. (Ref: Radiological Reasoning: Algorithmic  ?Workup of Abnormal Vaginal Bleeding with Endovaginal Sonography and  ?Sonohysterography. AJR 2008; [id number])  ?2. Poorly defined endometrial stripe, difficult to distinctly  ?visualized from adjacent myometrium; overall heterogeneous  ?myometrial echotexture, question adenomyosis.  ? ?Review of Systems ?Had heavy bleeding, started Micronor and no bleeding now ?Patient denies any headaches, hearing loss, fatigue, blurred vision, shortness of breath, chest pain, abdominal pain, problems with bowel movements, urination, or intercourse. No joint pain or mood swings.  ?Reviewed past medical,surgical, social and family history. Reviewed medications and allergies.  ?   ?Objective:  ? Physical Exam ?BP 134/84 (BP Location: Right Arm, Patient Position: Sitting, Cuff Size: Large)   Pulse 73   Ht '5\' 7"'$  (1.702 m)   Wt 192 lb 12.8 oz (87.5 kg)   LMP  (LMP Unknown)   BMI 30.20 kg/m?   ?  Skin warm and dry. Lungs: clear to ausculation bilaterally. Cardiovascular: regular rate and rhythm.  ?Pelvic: external genitalia is normal in appearance no lesions, vagina: pink and moist,urethra has no lesions or masses noted,  cervix:smooth and bulbous, pap with HR HPV genotyping performed ,uterus: normal size, shape and contour, non tender, no masses felt, adnexa: no masses or tenderness noted. Bladder is non tender and no masses felt. ?AA is 0 ?Fall risk is low ? ?  01/02/2022  ? 10:20 AM  ?Depression screen PHQ 2/9  ?Decreased Interest 0  ?Down, Depressed, Hopeless 0  ?PHQ - 2 Score 0  ?Altered sleeping 3  ?Tired, decreased energy 2  ?Change in appetite 3  ?Feeling bad or failure about yourself  0  ?Trouble concentrating 0  ?Moving slowly or fidgety/restless 0  ?Suicidal thoughts 0  ?PHQ-9 Score 8  ?  ? ?  01/02/2022  ? 10:20 AM  ?GAD 7 : Generalized Anxiety Score  ?Nervous, Anxious, on Edge 0  ?Control/stop worrying 1  ?Worry too much - different things 0  ?Trouble relaxing 2  ?Restless 0  ?Easily annoyed or irritable 0  ?Afraid - awful might happen 0  ?Total GAD 7 Score 3  ? ?  ? Upstream - 01/02/22 1020   ? ?  ? Pregnancy Intention Screening  ? Does the patient want to become pregnant in the next year? No   ? Does the patient's partner want to become pregnant in the next year? No   ? Would the patient like to discuss contraceptive options today? No   ?  ? Contraception Wrap Up  ? Current Method Oral Contraceptive   ? End Method Oral Contraceptive   ? Contraception Counseling Provided No   ? ?  ?  ? ?  ? Examination chaperoned by Glenard Haring RN ?Assessment:  ?   ?  1. Uterine mass ?Will get follow up US first to reassess uterus ?- US PELVIC COMPLETE WITH TRANSVAGINAL; Future ? ?2. History of menorrhagia ?Continue Micronor ? ?- US PELVIC COMPLETE WITH TRANSVAGINAL; Future ? ?3. Papanicolaou smear ?Pap sent ?Pap in 3 years if normal  ?- Cytology - PAP  ?   ?Plan:  ?   ?Follow up with me in 2 weeks to discuss [place] and any further treatment needed ?   ?

## 2022-01-05 LAB — CYTOLOGY - PAP
Adequacy: ABSENT
Comment: NEGATIVE
Diagnosis: NEGATIVE
High risk HPV: NEGATIVE

## 2022-01-09 ENCOUNTER — Ambulatory Visit (HOSPITAL_COMMUNITY)
Admission: RE | Admit: 2022-01-09 | Discharge: 2022-01-09 | Disposition: A | Discharge status: Home / Self Care | Payer: Medicare HMO | Source: Ambulatory Visit | Attending: Adult Health | Admitting: Adult Health

## 2022-01-09 DIAGNOSIS — N858 Other specified noninflammatory disorders of uterus: Secondary | ICD-10-CM | POA: Insufficient documentation

## 2022-01-09 DIAGNOSIS — Z8742 Personal history of other diseases of the female genital tract: Secondary | ICD-10-CM | POA: Insufficient documentation

## 2022-01-16 ENCOUNTER — Encounter: Payer: Self-pay | Admitting: Adult Health

## 2022-01-16 ENCOUNTER — Ambulatory Visit (INDEPENDENT_AMBULATORY_CARE_PROVIDER_SITE_OTHER): Payer: Medicare HMO | Admitting: Adult Health

## 2022-01-16 ENCOUNTER — Ambulatory Visit: Payer: Medicare HMO | Admitting: Adult Health

## 2022-01-16 DIAGNOSIS — N83202 Unspecified ovarian cyst, left side: Secondary | ICD-10-CM

## 2022-01-16 DIAGNOSIS — Z8742 Personal history of other diseases of the female genital tract: Secondary | ICD-10-CM

## 2022-01-16 DIAGNOSIS — N8003 Adenomyosis of the uterus: Secondary | ICD-10-CM | POA: Insufficient documentation

## 2022-01-16 NOTE — Progress Notes (Signed)
Patient ID: [name], female   DOB: [birth year], 51 y.o.   MRN: [id number] ? ? ?TELEHEALTH GYNECOLOGY VISIT ENCOUNTER NOTE ? ?Provider location: [facility] [employer] [facility]  ? ?Patient location: Home ? ?I connected with Eiman Maret Prohaska on 01/16/22 at  4:10 PM EDT by telephone and verified that I am speaking with the correct person using two identifiers. Patient was unable to do MyChart audiovisual encounter due to technical difficulties, she tried several times.  ?  ?I discussed the limitations, risks, security and privacy concerns of performing an evaluation and management service by telephone and the availability of in person appointments. I also discussed with the patient that there may be a patient responsible charge related to this service. The patient expressed understanding and agreed to proceed. ?  ?History:  ?[name] is a 51 y.o. [phone] female being evaluated today for follow up on [place], ?fiborid, none seen has cyst left ovary and ? Adenomyosis. She is taking Micronor now, and no bleeding She denies any abnormal vaginal discharge, bleeding, pelvic pain or other concerns.   ? She has had GI bug got 3 days, nausea,vomiting and diarrhea, got it from the kids, but they only had it 2 days.  ?  ?Past Medical History:  ?Diagnosis Date  ? Adjustment disorder   ? Arthritis   ? hands, knees  ? Asthma   ? Endometrial polyp   ? Fibromyalgia   ? Headache(784.0)   ? Pregnancy   ? SVD (spontaneous vaginal delivery)   ? x 1  ? ?Past Surgical History:  ?Procedure Laterality Date  ? CESAREAN SECTION    ? x 4  ? CHOLECYSTECTOMY N/A 05/11/2018  ? Procedure: LAPAROSCOPIC CHOLECYSTECTOMY;  Surgeon: Virl Cagey, MD;  Location: [facility];  Service: General;  Laterality: N/A;  ? right thumb surgery    ? pins  ? WISDOM TOOTH EXTRACTION    ? ?The following portions of the patient's history were reviewed and updated as appropriate: allergies, current medications, past family history, past medical history, past  social history, past surgical history and problem list.  ? ?Health Maintenance:   ?Lab Results  ?Component Value Date  ? DIAGPAP  01/02/2022  ?  - Negative for intraepithelial lesion or malignancy (NILM)  ? HPV NOT DETECTED 04/09/2017  ? [place] Negative 01/02/2022  ?   Normal mammogram on 01/25/21.  ? ?Review of Systems:  ?Pertinent items noted in HPI and remainder of comprehensive ROS otherwise negative. ? ?Physical Exam:  ? ?General:  Alert, oriented and cooperative.   ?Mental Status: Normal mood and affect perceived. Normal judgment and thought content.  ?Physical exam deferred due to nature of the encounter ? Upstream - 01/16/22 1639   ? ?  ? Pregnancy Intention Screening  ? Does the patient want to become pregnant in the next year? No   ? Does the patient's partner want to become pregnant in the next year? No   ? Would the patient like to discuss contraceptive options today? No   ?  ? Contraception Wrap Up  ? Current Method Oral Contraceptive   ? End Method Oral Contraceptive   ? Contraception Counseling Provided No   ? ?  ?  ? ?  ?  ?Labs and Imaging ?No results found for this or any previous visit (from the past 336 hour(s)). ?US PELVIC COMPLETE WITH TRANSVAGINAL ? ?Result Date: 01/09/2022 ?CLINICAL DATA:  Uterine fibroid EXAM: TRANSABDOMINAL AND TRANSVAGINAL ULTRASOUND OF PELVIS DOPPLER  ULTRASOUND OF OVARIES TECHNIQUE: Both transabdominal and transvaginal ultrasound examinations of the pelvis were performed. Transabdominal technique was performed for global imaging of the pelvis including uterus, ovaries, adnexal regions, and pelvic cul-de-sac. It was necessary to proceed with endovaginal exam following the transabdominal exam to visualize the endometrium and ovaries. Color and duplex Doppler ultrasound was utilized to evaluate blood flow to the ovaries. COMPARISON:  09/10/2021 FINDINGS: Uterus Measurements: 8 x 4.3 x 5 cm = volume: 90.9 mL. There is inhomogeneous echogenicity in myometrium with increased  echogenicity in the fundus. There is no discrete measurable focal fibroid. Increased echogenicity in the myometrium in the fundus suggests possible adenomyosis. Endometrium Thickness: 1.4 mm.  No focal abnormality visualized. Right ovary Measurements: 2.8 x 1.3 x 1.2 cm = volume: 2.3 mL. Normal appearance/no adnexal mass. Left ovary Measurements: 3.2 x 2.9 x 3.5 cm = volume: 16.9 ML. There is 2.6 x 2.3 x 2.1 cm cystic structure in the left adnexa without internal septations or mural nodules. There are low-level echoes within the lesion. Findings suggest possible hemorrhagic cyst/follicle. Pulsed Doppler evaluation of both ovaries demonstrates normal low-resistance arterial and venous waveforms. Other findings No abnormal free fluid. IMPRESSION: Fundus of the uterus is globular in shape with increased echogenicity suggesting possible adenomyosis. There is no discrete measurable fibroid in the uterus. There is no abnormal thickening of endometrial stripe. There is 2.6 cm hypoechoic structure in the left adnexa, possibly hemorrhagic cyst/follicle. Electronically Signed   By: Elmer Picker M.D.   On: 01/09/2022 13:54      ?Assessment and Plan:  ?   ?  1. History of menorrhagia ?Continue Micronor,has refills from PCP ?Pap in 3 years  ?Call if bleeding returns  ? ?2. Adenomyosis ?3. Left ovary cyst  ?   ?  ?I discussed the assessment and treatment plan with the patient. The patient was provided an opportunity to ask questions and all were answered. The patient agreed with the plan and demonstrated an understanding of the instructions. ?  ?The patient was advised to call back or seek an in-person evaluation/go to the ED if the symptoms worsen or if the condition fails to improve as anticipated. ? ?I provided 10  minutes of non-face-to-face time during this encounter. ?I was in my office at [facility] tree during this encounter  ? ?Derrek Monaco, NP ?[facility] for [facility]  ?

## 2022-03-14 ENCOUNTER — Other Ambulatory Visit: Payer: Self-pay | Admitting: Neurology

## 2022-03-17 NOTE — Telephone Encounter (Signed)
Verify Drug Registry For butalb-acetamin-caff 50-325-40 Last Filled: 12/12/2021 Quantity: 30 tablets Last appointment: 12/12/2021 Next appointment: 12/17/2022

## 2022-04-14 ENCOUNTER — Telehealth: Payer: Self-pay

## 2022-04-14 NOTE — Telephone Encounter (Signed)
Pt was taking birth control on and off for 2 weeks then started taking it every day 1 week ago. Pt is having heavy bleeding, diarrhea and pain in stomach. These symptoms started over the weekend. No fever. Pt was advised she has to take her birth control pill at the same time everyday or she will continue to have irregular bleeding. Pt was at her PCP for a knot and was going to mention her symptoms there. As far as diarrhea and stomach pain, she may have a stomach bug. Pt voiced understanding. [place]

## 2022-04-14 NOTE — Telephone Encounter (Signed)
Pt called and stated that she is bleeding bad, she would like Anderson Malta to call her asap.

## 2022-04-22 ENCOUNTER — Other Ambulatory Visit: Payer: Self-pay | Admitting: Neurology

## 2022-04-22 NOTE — Telephone Encounter (Signed)
Verify Drug Registry For butalbital-acetaminophen-caffeine [place]) [phone] Last Filled: 03/17/2022 Quantity: 30 tablets Last appointment: 12/12/2021 Next appointment: 12/17/2022

## 2022-05-23 ENCOUNTER — Other Ambulatory Visit: Payer: Self-pay | Admitting: Neurology

## 2022-06-24 ENCOUNTER — Ambulatory Visit: Payer: Medicare HMO | Admitting: Internal Medicine

## 2022-06-24 NOTE — Progress Notes (Deleted)
Name: Neyla Gauntt Howeth  MRN/ DOB: [id number], January 05, 1971    Age/ Sex: 51 y.o., female    PCP: Olga Coaster, FNP   Reason for Endocrinology Evaluation: ***     Date of Initial Endocrinology Evaluation: 06/24/2022     HPI: Ms. Yuleni Burich Olander is a 51 y.o. female with a past medical history of ***. The patient presented for initial endocrinology clinic visit on 06/24/2022 for consultative assistance with her ***.   Pt has been evaluated for a localized fluid collection at the posterior aspect f the neck at the base of the skull in 04/2022  HISTORY:  Past Medical History:  Past Medical History:  Diagnosis Date   Adjustment disorder    Arthritis    hands, knees   Asthma    Endometrial polyp    Fibromyalgia    Headache(784.0)    Pregnancy    SVD (spontaneous vaginal delivery)    x 1   Past Surgical History:  Past Surgical History:  Procedure Laterality Date   CESAREAN SECTION     x 4   CHOLECYSTECTOMY N/A 05/11/2018   Procedure: LAPAROSCOPIC CHOLECYSTECTOMY;  Surgeon: Virl Cagey, MD;  Location: [facility];  Service: General;  Laterality: N/A;   right thumb surgery     pins   WISDOM TOOTH EXTRACTION      Social History:  reports that she has been smoking cigarettes. She has a 5.00 pack-year smoking history. She has never used smokeless tobacco. She reports that she does not drink alcohol and does not use drugs. Family History: family history includes Breast cancer in her mother; CAD in her father; Cancer in her mother and another family member; Diabetes in her mother and another family member; Heart disease in her father; Heart failure in her father and another family member; Hypertension in her mother and another family member; Seizures in her son.   HOME MEDICATIONS: Allergies as of 06/24/2022       Reactions   Aspirin Shortness Of Breath, Other (See Comments)   Latex Swelling   Other reaction(s): Edema Other reaction(s): Edema   Nsaids Shortness Of Breath    Amitriptyline    Morphine And Related Itching   Phenergan [promethazine] Other (See Comments)   Somnolence    Morphine Itching   Other reaction(s): hives   Penicillins Rash, Other (See Comments)   Pt states that she is allergic to all -cillins.  Has patient had a PCN reaction causing immediate rash, facial/tongue/throat swelling, SOB or lightheadedness with hypotension: No Has patient had a PCN reaction causing severe rash involving mucus membranes or skin necrosis: No Has patient had a PCN reaction that required hospitalization: No Has patient had a PCN reaction occurring within the last 10 years: No If all of the above answers are "NO", then may proceed with Cephalosporin use. Other reaction(s): hives Other reaction(s): Other (See Comments) Pt states that she is allergic to all -cillins.  Has patient had a PCN reaction causing immediate rash, facial/tongue/throat swelling, SOB or lightheadedness with hypotension: No Has patient had a PCN reaction causing severe rash involving mucus membranes or skin necrosis: No Has patient had a PCN reaction that required hospitalization: No Has patient had a PCN reaction occurring within the last 10 years: No If all of the above answers are "NO", then may proceed with Cephalosporin use.   Sulfa Antibiotics Rash, Other (See Comments)   Toradol [ketorolac Tromethamine] Nausea And Vomiting        Medication  List        Accurate as of June 24, 2022  8:01 AM. If you have any questions, ask your nurse or doctor.          atorvastatin 10 MG tablet Commonly known as: LIPITOR 1 tablet   budesonide-formoterol 80-4.5 MCG/ACT inhaler Commonly known as: SYMBICORT 2 puffs   butalbital-acetaminophen-caffeine 50-325-40 MG tablet Commonly known as: FIORICET TAKE 1 TABLET BY MOUTH EVERY 6 HOURS AS NEEDED FOR HEADACHE   Cholecalciferol 20 MCG (800 UNIT) Tabs 1 tablet   cyclobenzaprine 10 MG tablet Commonly known as: FLEXERIL 1 tablet three  times daily as needed   dicyclomine 20 MG tablet Commonly known as: BENTYL Take 20 mg by mouth 3 (three) times daily.   EPINEPHrine 0.3 mg/0.3 mL Soaj injection Commonly known as: EPI-PEN Inject 0.3 mg into the muscle as needed for anaphylaxis.   HYDROcodone-acetaminophen 5-325 MG tablet Commonly known as: NORCO/VICODIN hydrocodone 5 mg-acetaminophen 325 mg tablet  TAKE (1) TABLET THREE TIMES DAILY AS DIRECTED.   ibuprofen 800 MG tablet Commonly known as: ADVIL 800 mg every 6 (six) hours as needed.   losartan 25 MG tablet Commonly known as: COZAAR Take 25 mg by mouth daily.   norethindrone 0.35 MG tablet Commonly known as: MICRONOR Take 1 tablet by mouth daily.   ondansetron 4 MG disintegrating tablet Commonly known as: ZOFRAN-ODT Take by mouth.   varenicline 0.5 MG tablet Commonly known as: CHANTIX Take by mouth.          REVIEW OF SYSTEMS: A comprehensive ROS was conducted with the patient and is negative except as per HPI and below:  ROS     OBJECTIVE:  VS: There were no vitals taken for this visit.   Wt Readings from Last 3 Encounters:  01/02/22 192 lb 12.8 oz (87.5 kg)  12/12/21 193 lb 8 oz (87.8 kg)  08/21/21 194 lb 12.8 oz (88.4 kg)     EXAM: General: Pt appears well and is in NAD  Eyes: External eye exam normal without stare, lid lag or exophthalmos.  EOM intact.  PERRL.  Neck: General: Supple without adenopathy. Thyroid: Thyroid size normal.  No goiter or nodules appreciated. No thyroid bruit.  Lungs: Clear with good BS bilat with no rales, rhonchi, or wheezes  Heart: Auscultation: RRR.  Abdomen: Normoactive bowel sounds, soft, nontender, without masses or organomegaly palpable  Extremities:  BL LE: No pretibial edema normal ROM and strength.  Mental Status: Judgment, insight: Intact Orientation: Oriented to time, place, and person Mood and affect: No depression, anxiety, or agitation     DATA REVIEWED: ***     ASSESSMENT/PLAN/RECOMMENDATIONS:   ***    Medications :  Signed electronically by: Mack Guise, MD  [facility]  [facility] [address] [place], [place] [address] Phone: [phone] FAX: [phone]   CC: Olga Coaster, [address] [place] [address] Phone: [phone] Fax: [phone]   Return to Endocrinology clinic as below: Future Appointments  Date Time Provider [place]  06/24/2022 11:30 AM Phylicia Mcgaugh, Melanie Crazier, MD [facility] None  12/17/2022 11:15 AM Alric Ran, MD [facility] None

## 2022-07-01 ENCOUNTER — Other Ambulatory Visit: Payer: Self-pay | Admitting: Neurology

## 2022-07-02 ENCOUNTER — Other Ambulatory Visit: Payer: Self-pay | Admitting: Neurology

## 2022-07-02 NOTE — Telephone Encounter (Signed)
Looks like patient is requesting monthly refills on Fioricet. Please review if appropriate to refill monthly. Also has a Rx for [employer] on file. Will refill for 30 pills in Dr. Jabier Mutton absence.

## 2022-07-07 NOTE — Telephone Encounter (Signed)
Scheduled pt for 09/11/22 at 11:15 am with Dr. April Manson.

## 2022-07-07 NOTE — Telephone Encounter (Signed)
Thank you Dr. Rexene Alberts, I will schedule a follow up appointment with patient to discuss medication.  Megan/Candace, can you please contact the patient for a follow up visit to discuss medication. It can be a virtual visit. Thanks

## 2022-08-01 ENCOUNTER — Telehealth: Payer: Self-pay | Admitting: Neurology

## 2022-08-04 ENCOUNTER — Encounter: Payer: Self-pay | Admitting: Neurology

## 2022-08-04 NOTE — Telephone Encounter (Signed)
FYI: There are no sooner openings before pt's already scheduled 12/7 appt. I added her to the waitlist.

## 2022-08-04 NOTE — Telephone Encounter (Signed)
Noted, thank you

## 2022-08-04 NOTE — Progress Notes (Signed)
Patient was advised that she needs to come in for follow up to discuss better headaches medication. I will not continue to prescribed her Fioricet. I will give her a last 30 days supply.   Dr. April Manson

## 2022-08-04 NOTE — Telephone Encounter (Signed)
Please call and inform that that I will need to see her for follow up to discuss better medication for headaches. I will fill it for another 30 days but she needs to set up appointment to discuss better headaches management.

## 2022-09-02 ENCOUNTER — Telehealth: Payer: Self-pay | Admitting: Adult Health

## 2022-09-02 NOTE — Telephone Encounter (Signed)
Patient has a question about her birth control and would like a call to discuss Please advise.

## 2022-09-02 NOTE — Telephone Encounter (Signed)
Mailbox full @ 11:43 am. [employer]

## 2022-09-02 NOTE — Telephone Encounter (Signed)
Pt has pain stomach has Crohn's diagnosed recently, she has missed several  pills of Micronor, and had no bleeding, will get back on Micronor tomorrow, if pain persists let me or GI doctor know, but I don't feel is due to missed Micronor.

## 2022-09-02 NOTE — Telephone Encounter (Addendum)
Pt was recently diagnosed with Crohn's disease. Pt is on Micronor. Pt is hurting in her stomach. She misplaced her pack and hasn't had pill for a couple days. Pt wonders if her stomach pain is from missing those pills or if it could be Crohn's disease. Pt called her pharmacy and she is due to get pills tomorrow. Pt wants to know your thoughts. Thanks! [place]

## 2022-09-11 ENCOUNTER — Ambulatory Visit: Payer: Medicare HMO | Admitting: Neurology

## 2022-09-15 ENCOUNTER — Other Ambulatory Visit: Payer: Self-pay | Admitting: Neurology

## 2022-09-17 ENCOUNTER — Telehealth: Payer: Self-pay | Admitting: Neurology

## 2022-09-17 ENCOUNTER — Other Ambulatory Visit: Payer: Self-pay | Admitting: Neurology

## 2022-09-17 NOTE — Telephone Encounter (Signed)
Pt called needing a refill request for her butalbital-acetaminophen-caffeine (FIORICET) 50-325-40 MG tablet sent in to the [facility]

## 2022-09-17 NOTE — Telephone Encounter (Signed)
Per note from  Progress Notes by Alric Ran, MD at 08/04/2022 2:29 PM  Author: Alric Ran, MD Author Type: Physician Filed: 08/04/2022  2:30 PM  Note Status: Signed Cosign: Cosign Not Required Encounter Date: 08/04/2022  Editor: Alric Ran, MD (Physician)             Patient was advised that she needs to come in for follow up to discuss better headaches medication. I will not continue to prescribed her Fioricet. I will give her a last 30 days supply.    Dr. April Manson       Please advised pt we will discuss next options at her follow up appt.

## 2022-11-12 ENCOUNTER — Ambulatory Visit: Payer: Medicare HMO | Admitting: Neurology

## 2022-11-12 ENCOUNTER — Encounter: Payer: Self-pay | Admitting: Neurology

## 2022-12-17 ENCOUNTER — Ambulatory Visit: Payer: Medicare HMO | Admitting: Neurology

## 2022-12-17 ENCOUNTER — Encounter: Payer: Self-pay | Admitting: Neurology

## 2023-03-11 ENCOUNTER — Encounter: Payer: Self-pay | Admitting: Family Medicine

## 2023-06-27 ENCOUNTER — Encounter (HOSPITAL_COMMUNITY): Payer: Self-pay | Admitting: Emergency Medicine

## 2023-06-27 ENCOUNTER — Other Ambulatory Visit: Payer: Self-pay

## 2023-06-27 ENCOUNTER — Emergency Department (HOSPITAL_COMMUNITY)
Admission: EM | Admit: 2023-06-27 | Discharge: 2023-06-27 | Disposition: A | Discharge status: Home / Self Care | Payer: Medicare HMO | Attending: Emergency Medicine | Admitting: Emergency Medicine

## 2023-06-27 DIAGNOSIS — Z91040 Latex allergy status: Secondary | ICD-10-CM | POA: Insufficient documentation

## 2023-06-27 DIAGNOSIS — Z23 Encounter for immunization: Secondary | ICD-10-CM | POA: Insufficient documentation

## 2023-06-27 DIAGNOSIS — W01198A Fall on same level from slipping, tripping and stumbling with subsequent striking against other object, initial encounter: Secondary | ICD-10-CM | POA: Insufficient documentation

## 2023-06-27 DIAGNOSIS — S0181XA Laceration without foreign body of other part of head, initial encounter: Secondary | ICD-10-CM | POA: Diagnosis present

## 2023-06-27 DIAGNOSIS — S0990XA Unspecified injury of head, initial encounter: Secondary | ICD-10-CM

## 2023-06-27 MED ORDER — TETANUS-DIPHTH-ACELL PERTUSSIS 5-2.5-18.5 LF-MCG/0.5 IM SUSY
0.5000 mL | PREFILLED_SYRINGE | Freq: Once | INTRAMUSCULAR | Status: AC
  Administered 2023-06-27: 0.5 mL via INTRAMUSCULAR
  Filled 2023-06-27: qty 0.5

## 2023-06-27 NOTE — ED Notes (Signed)
Instructed pt to hold off on eating and drinking until seen by provider

## 2023-06-27 NOTE — ED Provider Notes (Signed)
[place] EMERGENCY DEPARTMENT AT [facility] Provider Note   CSN: [id number] Arrival date & time: 06/27/23  1232     History  Chief Complaint  Patient presents with   Head Laceration    [name] is a 52 y.o. female, no pertinent past medical history, who presents to the ED secondary to hitting her head, on a side of a piece of 10.  States she turned around, hit her head on tin roof, and did not pass out.  No use of blood thinners.  She denies any nausea, vomiting, headache, vision changes, or weakness on one side of the body.  Unknown last tetanus.  Home Medications Prior to Admission medications   Medication Sig Start Date End Date Taking? Authorizing Provider  atorvastatin (LIPITOR) 10 MG tablet 1 tablet 04/17/21   [provider]  budesonide-formoterol (SYMBICORT) 80-4.5 MCG/ACT inhaler 2 puffs    [provider]  butalbital-acetaminophen-caffeine (FIORICET) 50-325-40 MG tablet TAKE 1 TABLET BY MOUTH EVERY 6 HOURS AS NEEDED FOR HEADACHE 09/16/22   Windell Norfolk, MD  Cholecalciferol 20 MCG (800 UNIT) TABS 1 tablet 12/22/21   [provider]  cyclobenzaprine (FLEXERIL) 10 MG tablet 1 tablet three times daily as needed    [provider]  dicyclomine (BENTYL) 20 MG tablet Take 20 mg by mouth 3 (three) times daily. 12/31/21   [provider]  EPINEPHrine 0.3 mg/0.3 mL IJ SOAJ injection Inject 0.3 mg into the muscle as needed for anaphylaxis. 05/12/20   [provider]  HYDROcodone-acetaminophen (NORCO/VICODIN) 5-325 MG tablet hydrocodone 5 mg-acetaminophen 325 mg tablet  TAKE (1) TABLET THREE TIMES DAILY AS DIRECTED.    [provider]  ibuprofen (ADVIL) 800 MG tablet 800 mg every 6 (six) hours as needed.    [provider]  losartan (COZAAR) 25 MG tablet Take 25 mg by mouth daily. 12/17/21   [provider]  norethindrone (MICRONOR) 0.35 MG tablet Take 1 tablet by mouth daily. 12/17/21    [provider]  ondansetron (ZOFRAN-ODT) 4 MG disintegrating tablet Take by mouth. 12/31/21   [provider]  varenicline (CHANTIX) 0.5 MG tablet Take by mouth. Patient not taking: Reported on 01/02/2022 12/31/21   [provider]      Allergies    Aspirin, Latex, Nsaids, Amitriptyline, Morphine and codeine, Phenergan [promethazine], Morphine, Penicillins, Sulfa antibiotics, and Toradol [ketorolac tromethamine]    Review of Systems   Review of Systems  Physical Exam Updated Vital Signs BP (!) 176/101 (BP Location: Left Arm)   Pulse 71   Temp 98.2 F (36.8 C) (Oral)   Resp 18   Ht 5\' 8"  (1.727 m)   Wt 90.7 kg   SpO2 96%   BMI 30.41 kg/m  Physical Exam Vitals and nursing note reviewed.  Constitutional:      General: She is not in acute distress.    Appearance: She is well-developed.  HENT:     Head: Normocephalic and atraumatic.     Comments: No battle sign. No fractured teeth. No hemotympaneum. No septal hematoma. No nasal or maxillary or mandibular ttp.  Eyes:     Conjunctiva/sclera: Conjunctivae normal.  Cardiovascular:     Rate and Rhythm: Normal rate and regular rhythm.     Heart sounds: No murmur heard. Pulmonary:     Effort: Pulmonary effort is normal. No respiratory distress.     Breath sounds: Normal breath sounds.  Abdominal:     Palpations: Abdomen is soft.  Tenderness: There is no abdominal tenderness.  Musculoskeletal:        General: No swelling.     Cervical back: Neck supple.  Skin:    General: Skin is warm and dry.     Capillary Refill: Capillary refill takes less than 2 seconds.     Comments: [name] 1 cm superficial laceration to left forehead along hairline  Neurological:     Mental Status: She is alert.  Psychiatric:        Mood and Affect: Mood normal.     ED Results / Procedures / Treatments   Labs (all labs ordered are listed, but only abnormal results are displayed) Labs Reviewed - No data to  display  EKG None  Radiology No results found.  Procedures .Marland KitchenLaceration Repair  Date/Time: 06/27/2023 2:57 PM  Performed by: Pete Pelt, PA Authorized by: Pete Pelt, PA   Consent:    Consent obtained:  Verbal   Consent given by:  Patient   Risks, benefits, and alternatives were discussed: yes     Risks discussed:  Infection, need for additional repair, nerve damage, poor cosmetic result, pain, poor wound healing, retained foreign body, tendon damage and vascular damage   Alternatives discussed:  No treatment Universal protocol:    Patient identity confirmed:  Verbally with patient Anesthesia:    Anesthesia method:  None Laceration details:    Location:  Face   Face location:  Forehead   Length (cm):  1 Treatment:    Area cleansed with:  Chlorhexidine   Amount of cleaning:  Standard   Irrigation solution:  Sterile saline   Irrigation method:  Syringe Skin repair:    Repair method:  Steri-Strips and tissue adhesive   Number of Steri-Strips:  2 Approximation:    Approximation:  Close Repair type:    Repair type:  Simple Post-procedure details:    Dressing:  Open (no dressing)   Procedure completion:  Tolerated     Medications Ordered in ED Medications  Tdap (BOOSTRIX) injection 0.5 mL (0.5 mLs Intramuscular Given 06/27/23 1418)    ED Course/ Medical Decision Making/ A&P                                 Medical Decision Making Patient is a 52 year old female, who hit her head on the side of a 52 year old.  She did not pass out, she is not on blood thinners.  She has no evidence of hemotympanum, septal hematoma, negative for Battle sign.  Well-appearing, no neurodeficits.  Has [name] laceration to left forehead, repaired with Steri-Strips, and glue.  She is [place] head CT negative.  We discussed return precautions and she voiced understanding.  Discharged home. Tetanus updated given no tetanus in the last 10 years and open wound exposure to  metal.  Risk Prescription drug management.    Final Clinical Impression(s) / ED Diagnoses Final diagnoses:  Laceration of forehead, initial encounter  Injury of head, initial encounter    Rx / DC Orders ED Discharge Orders     None         Christoper Bushey, Jamie Alto, PA 06/27/23 1459    Vanetta Mulders, MD 06/28/23 0800

## 2023-06-27 NOTE — ED Triage Notes (Signed)
Pt presents with head laceration to left side, per pt she walked into piece of tin.

## 2023-06-27 NOTE — Discharge Instructions (Addendum)
Please follow-up with your PCP. Return to the ED if you develop redness, swelling, warmth, confusion, intractable nausea or vomiting. Keep the area clean and dry. The steri-strips and dermabond will fall off on it's own.

## 2023-08-11 ENCOUNTER — Ambulatory Visit: Payer: Medicare HMO | Admitting: Internal Medicine

## 2023-08-11 ENCOUNTER — Encounter: Payer: Self-pay | Admitting: Internal Medicine

## 2023-08-11 NOTE — Progress Notes (Signed)
Erroneous encounter - please disregard.

## 2023-08-14 ENCOUNTER — Ambulatory Visit: Payer: Medicare HMO | Attending: Internal Medicine | Admitting: Internal Medicine

## 2023-08-14 ENCOUNTER — Encounter: Payer: Self-pay | Admitting: Internal Medicine

## 2023-08-14 ENCOUNTER — Other Ambulatory Visit (HOSPITAL_COMMUNITY)
Admission: RE | Admit: 2023-08-14 | Discharge: 2023-08-14 | Disposition: A | Discharge status: Home / Self Care | Payer: Medicare HMO | Source: Ambulatory Visit | Attending: Internal Medicine | Admitting: Internal Medicine

## 2023-08-14 VITALS — BP 142/74 | HR 78 | Ht 66.5 in | Wt 209.0 lb

## 2023-08-14 DIAGNOSIS — Z720 Tobacco use: Secondary | ICD-10-CM | POA: Diagnosis not present

## 2023-08-14 DIAGNOSIS — R079 Chest pain, unspecified: Secondary | ICD-10-CM | POA: Diagnosis present

## 2023-08-14 DIAGNOSIS — I10 Essential (primary) hypertension: Secondary | ICD-10-CM | POA: Insufficient documentation

## 2023-08-14 LAB — BASIC METABOLIC PANEL
Anion gap: 8 (ref 5–15)
BUN: 16 mg/dL (ref 6–20)
CO2: 27 mmol/L (ref 22–32)
Calcium: 9.3 mg/dL (ref 8.9–10.3)
Chloride: 101 mmol/L (ref 98–111)
Creatinine, Ser: 0.79 mg/dL (ref 0.44–1.00)
GFR, Estimated: >60 mL/min (ref >60)
Glucose, Bld: 132 mg/dL — ABNORMAL HIGH (ref 70–99)
Potassium: 4.4 mmol/L (ref 3.5–5.1)
Sodium: 136 mmol/L (ref 135–145)

## 2023-08-14 MED ORDER — NITROGLYCERIN 0.4 MG SL SUBL: 0.4000 mg | SUBLINGUAL_TABLET | SUBLINGUAL | 3 refills | Status: AC | PRN

## 2023-08-14 MED ORDER — METOPROLOL TARTRATE 100 MG PO TABS
ORAL_TABLET | ORAL | 0 refills | Status: AC
Start: 2023-08-14 — End: ?

## 2023-08-14 MED ORDER — CHLORTHALIDONE 25 MG PO TABS: 25.0000 mg | ORAL_TABLET | Freq: Every day | ORAL | 3 refills | Status: DC

## 2023-08-14 MED ORDER — CARVEDILOL 3.125 MG PO TABS: 3.1250 mg | ORAL_TABLET | Freq: Two times a day (BID) | ORAL | 3 refills | Status: DC

## 2023-08-14 NOTE — Patient Instructions (Addendum)
Medication Instructions:  Your physician has recommended you make the following change in your medication:   -Discontinue Losartan -Start Chlorthalidone 25 mg tablet once daily -Start Coreg 3.125 mg tablet twice daily -Start SL Nitroglycerin 0.4 mg tablet as needed for chest pain  HOLD MEDICATIONS FOR BLOOD PRESSURE LESS THAN 100- TOP NUMBER  *If you need a refill on your cardiac medications before your next appointment, please call your pharmacy*   Lab Work: BMET  If you have labs (blood work) drawn today and your tests are completely normal, you will receive your results only by: MyChart Message (if you have MyChart) OR A paper copy in the mail If you have any lab test that is abnormal or we need to change your treatment, we will call you to review the results.   Testing/Procedures: Your physician has requested that you have an echocardiogram. Echocardiography is a painless test that uses sound waves to create images of your heart. It provides your doctor with information about the size and shape of your heart and how well your heart's chambers and valves are working. This procedure takes approximately one hour. There are no restrictions for this procedure. Please do NOT wear cologne, perfume, aftershave, or lotions (deodorant is allowed). Please arrive 15 minutes prior to your appointment time.  Please note: We ask at that you not bring children with you during ultrasound (echo/ vascular) testing. Due to room size and safety concerns, children are not allowed in the ultrasound rooms during exams. Our front office staff cannot provide observation of children in our lobby area while testing is being conducted. An adult accompanying a patient to their appointment will only be allowed in the ultrasound room at the discretion of the ultrasound technician under special circumstances. We apologize for any inconvenience.  Cardiac CTA   Follow-Up: At [facility], you and your  health needs are our priority.  As part of our continuing mission to provide you with exceptional heart care, we have created designated Provider Care Teams.  These Care Teams include your primary Cardiologist (physician) and Advanced Practice Providers (APPs -  Physician Assistants and Nurse Practitioners) who all work together to provide you with the care you need, when you need it.  We recommend signing up for the patient portal called "MyChart".  Sign up information is provided on this After Visit Summary.  MyChart is used to connect with patients for Virtual Visits (Telemedicine).  Patients are able to view lab/test results, encounter notes, upcoming appointments, etc.  Non-urgent messages can be sent to your provider as well.   To learn more about what you can do with MyChart, go to [email or web address].    Your next appointment:    Follow up pending   Provider:   Luane School, MD    Other Instructions   Your cardiac CT will be scheduled at one of the below locations:   [facility] [address] [place], [place] [address] [phone]   If scheduled at [facility], please arrive at the [facility] and Children's Entrance (Entrance C2) of [facility] 30 minutes prior to test start time. You can use the FREE valet parking offered at entrance C (encouraged to control the heart rate for the test)  Proceed to the [facility] Radiology Department (first floor) to check-in and test prep.  All radiology patients and guests should use entrance C2 at [facility], accessed from [facility], even though the hospital's physical address listed is  [address]    If scheduled at [facility] or [facility], please arrive 15 mins early for check-in and test prep.  There is spacious parking and easy access to the radiology department from the [facility] Heart and Vascular entrance. Please  enter here and check-in with the desk attendant.   Please follow these instructions carefully (unless otherwise directed):  On the Night Before the Test: Be sure to Drink plenty of water. Do not consume any caffeinated/decaffeinated beverages or chocolate 12 hours prior to your test. Do not take any antihistamines 12 hours prior to your test.  On the Day of the Test: Drink plenty of water until 1 hour prior to the test. Do not eat any food 1 hour prior to test. You may take your regular medications prior to the test.  Take metoprolol (Lopressor) two hours prior to test. If you take Furosemide/Hydrochlorothiazide/Spironolactone, please HOLD on the morning of the test. FEMALES- please wear underwire-free bra if available, avoid dresses & tight clothing        After the Test: Drink plenty of water. After receiving IV contrast, you may experience a mild flushed feeling. This is normal. On occasion, you may experience a mild rash up to 24 hours after the test. This is not dangerous. If this occurs, you can take Benadryl 25 mg and increase your fluid intake. If you experience trouble breathing, this can be serious. If it is severe call 911 IMMEDIATELY. If it is mild, please call our office. If you take any of these medications: Glipizide/Metformin, Avandament, Glucavance, please do not take 48 hours after completing test unless otherwise instructed.  We will call to schedule your test 2-4 weeks out understanding that some insurance companies will need an authorization prior to the service being performed.   For more information and frequently asked questions, please visit our website : [email or web address]  For non-scheduling related questions, please contact the cardiac imaging nurse navigator should you have any questions/concerns: Cardiac Imaging Nurse Navigators Direct Office Dial: [phone]   For scheduling needs, including cancellations and rescheduling, please call  [place], [phone].

## 2023-08-14 NOTE — Progress Notes (Signed)
Cardiology Office Note  Date: 08/14/2023   ID: [name], DOB 1971-09-01, MRN [id number]  PCP:  Shelby Dubin, FNP  Cardiologist:  Marjo Bicker, MD Electrophysiologist:  None   History of Present Illness: [name] is a 52 y.o. female known to have HTN was stable to cardiology clinic for evaluation of chest pain.  Patient had multiple ER visits for chest pain. Poor historian due to tangential thought process.  Chest pain started in 2 months ago when she was started on losartan by her PCP and patient is convinced that its losartan and atorvastatin that is causing her chest pain.  She was given sublingual nitroglycerin pill that improved/resolved her chest pains.  EKG showed mild ST depression inferior leads and ST changes in the lateral leads, old and not new.  Troponins within normal limits. Unable to elicit any history regarding her chest pains due to patient's tangential thought process.  She gets chest pains only when her blood pressures are elevated.  I reviewed her home BP log that showed BP ranging between 120 and 130 mmHg this week and she reported that she did not log her blood pressures from last week but it was elevated to 160 to 190 mmHg SBP.  She also has missing teeth and was told that her teeth needs to be removed, she is here for preop cardiac risk stratification for teeth removal under local or general anesthesia.   Past Medical History:  Diagnosis Date   Adjustment disorder    Arthritis    hands, knees   Asthma    Endometrial polyp    Fibromyalgia    Headache(784.0)    Pregnancy    SVD (spontaneous vaginal delivery)    x 1    Past Surgical History:  Procedure Laterality Date   CESAREAN SECTION     x 4   CHOLECYSTECTOMY N/A 05/11/2018   Procedure: LAPAROSCOPIC CHOLECYSTECTOMY;  Surgeon: Lucretia Roers, MD;  Location: [facility];  Service: General;  Laterality: N/A;   right thumb surgery     pins   WISDOM TOOTH EXTRACTION      Current  Outpatient Medications  Medication Sig Dispense Refill   atorvastatin (LIPITOR) 10 MG tablet 1 tablet     budesonide-formoterol (SYMBICORT) 80-4.5 MCG/ACT inhaler 2 puffs     butalbital-acetaminophen-caffeine (FIORICET) 50-325-40 MG tablet TAKE 1 TABLET BY MOUTH EVERY 6 HOURS AS NEEDED FOR HEADACHE 30 tablet 1   carvedilol (COREG) 3.125 MG tablet Take 1 tablet (3.125 mg total) by mouth 2 (two) times daily with a meal. 180 tablet 3   chlorthalidone (HYGROTON) 25 MG tablet Take 1 tablet (25 mg total) by mouth daily. 90 tablet 3   Cholecalciferol 20 MCG (800 UNIT) TABS 1 tablet     clindamycin (CLEOCIN) 150 MG capsule Take 450 mg by mouth 3 (three) times daily.     cyclobenzaprine (FLEXERIL) 10 MG tablet 1 tablet three times daily as needed     dicyclomine (BENTYL) 20 MG tablet Take 20 mg by mouth 3 (three) times daily.     EPINEPHrine 0.3 mg/0.3 mL IJ SOAJ injection Inject 0.3 mg into the muscle as needed for anaphylaxis.     HYDROcodone-acetaminophen (NORCO/VICODIN) 5-325 MG tablet hydrocodone 5 mg-acetaminophen 325 mg tablet  TAKE (1) TABLET THREE TIMES DAILY AS DIRECTED.     ibuprofen (ADVIL) 800 MG tablet 800 mg every 6 (six) hours as needed.     metoprolol tartrate (LOPRESSOR) 100 MG tablet Take 1 tablet  by mouth 2 hours prior to CT scan 1 tablet 0   nitroGLYCERIN (NITROSTAT) 0.4 MG SL tablet Place 1 tablet (0.4 mg total) under the tongue every 5 (five) minutes as needed for chest pain. If a single episode of chest pain is not relieved by one tablet, the patient will try another within 5 minutes; and if this doesn't relieve the pain, the patient is instructed to call 911 for transportation to an emergency department. 25 tablet 3   norethindrone (MICRONOR) 0.35 MG tablet Take 1 tablet by mouth daily.     ondansetron (ZOFRAN-ODT) 4 MG disintegrating tablet Take by mouth.     varenicline (CHANTIX) 0.5 MG tablet Take by mouth.     No current facility-administered medications for this visit.    Allergies:  Aspirin, Latex, Nsaids, Amitriptyline, Morphine and codeine, Phenergan [promethazine], Morphine, Penicillins, Sulfa antibiotics, and Toradol [ketorolac tromethamine]   Social History: The patient  reports that she quit smoking about 2 months ago. Her smoking use included cigarettes. She has a 5 pack-year smoking history. She has never used smokeless tobacco. She reports that she does not drink alcohol and does not use drugs.   Family History: The patient's family history includes Breast cancer in her mother; CAD in her father; Cancer in her mother and another family member; Diabetes in her mother and another family member; Heart disease in her father; Heart failure in her father and another family member; Hypertension in her mother and another family member; Seizures in her son.   ROS:  Please see the history of present illness. Otherwise, complete review of systems is positive for none.  All other systems are reviewed and negative.   Physical Exam: VS:  BP (!) 142/74   Pulse 78   Ht 5' 6.5" (1.689 m)   Wt 209 lb (94.8 kg)   SpO2 98%   BMI 33.23 kg/m , BMI Body mass index is 33.23 kg/m.  Wt Readings from Last 3 Encounters:  08/14/23 209 lb (94.8 kg)  06/27/23 200 lb (90.7 kg)  01/02/22 192 lb 12.8 oz (87.5 kg)    General: Patient appears comfortable at rest. HEENT: Conjunctiva and lids normal, oropharynx clear with moist mucosa. Neck: Supple, no elevated JVP or carotid bruits, no thyromegaly. Lungs: Clear to auscultation, nonlabored breathing at rest. Cardiac: Regular rate and rhythm, no S3 or significant systolic murmur, no pericardial rub. Abdomen: Soft, nontender, no hepatomegaly, bowel sounds present, no guarding or rebound. Extremities: No pitting edema, distal pulses 2+. Skin: Warm and dry. Musculoskeletal: No kyphosis. Neuropsychiatric: Alert and oriented x3, affect grossly appropriate.  Recent Labwork: 08/14/2023: BUN 16; Creatinine, Ser 0.79; Potassium 4.4;  Sodium 136  No results found for: "CHOL", "TRIG", "HDL", "CHOLHDL", "VLDL", "LDLCALC", "LDLDIRECT"   Assessment and Plan:  Preop cardiac risk stratification for teeth removal under local/general HTN Chest pain Nicotine abuse Tangential thought process   -Poor historian due to tangential thought process. Husband assisted with history to some extent. Patient has multiple ER visits for chest pain x 2 months and her BP was noted to be elevated, 160-190 mmHg SBP during that time. Patient is convinced that losartan and atorvastatin is causing her chest pains. Discontinue losartan, start carvedilol 3.125 mg twice daily and chlorthalidone 25 mg once daily. Instructed patient not to take antihypertensive medications if her BP is less than 100 mmHg SBP.  Will obtain echocardiogram and CT cardiac.  Otherwise, patient is low risk for low risk procedure like teeth removal under local or general anesthesia.  Smoking cessation counseling provided.  I spent a total duration of 45 minutes reviewing prior medications, labs, EKG, face-to-face discussion/counseling of her medical condition, pathophysiology, evaluation, management, ordering medication/tests and documenting the findings in the note.   Medication Adjustments/Labs and Tests Ordered: Current medicines are reviewed at length with the patient today.  Concerns regarding medicines are outlined above.    Disposition:  Follow up pending results  Signed, Yaneli Keithley Verne Spurr, MD, 08/14/2023 11:41 AM    [facility] at [facility] [address] [place], [place] [address]

## 2023-08-17 ENCOUNTER — Telehealth: Payer: Self-pay | Admitting: Internal Medicine

## 2023-08-17 NOTE — Telephone Encounter (Signed)
08/14/23 office note of Dr.Mallipeddi: -Poor historian due to tangential thought process. Husband assisted with history to some extent. Patient has multiple ER visits for chest pain x 2 months and her BP was noted to be elevated, 160-190 mmHg SBP during that time. Patient is convinced that losartan and atorvastatin is causing her chest pains. Discontinue losartan, start carvedilol 3.125 mg twice daily and chlorthalidone 25 mg once daily. Instructed patient not to take antihypertensive medications if her BP is less than 100 mmHg SBP.  Will obtain echocardiogram and CT cardiac.  Otherwise, patient is low risk for low risk procedure like teeth removal under local or general anesthesia. Smoking cessation counseling provided.        08/17/23 I spoke with patient and she states she was supposed to have her teeth removed by dentist this am and after she told them her BP,they declined to see her. She is very anxious and states she has NOT taken her medication this morning and she feels she needs to go to an ER. When I asked her why she did not take her medication ,she could not give me a reason other than she feels bad as soon as she stopped losartan and went on coreg and chlorthalidone.   I asked her to please take her coreg and chlorthalidone and recheck her BP in 2 hours . I will forward to Dr.Mallipeddi for review.

## 2023-08-17 NOTE — Telephone Encounter (Signed)
Pt c/o BP issue: STAT if pt c/o blurred vision, one-sided weakness or slurred speech  1. What are your last 5 BP readings? 154/101 HR 81  2. Are you having any other symptoms (ex. Dizziness, headache, blurred vision, passed out)? Nausea, rapid heartbeat, headache, blurred vision   3. What is your BP issue? Doctor changed her bp med's on 08/14/23.

## 2023-08-18 NOTE — Telephone Encounter (Signed)
I spoke with patient and she states she has been on the road since 6:30 am this morning and has not taken her BP meds.Once again, I encouraged her to take her medicine as prescribed. She states the medication has affected her taste buds and coffee that she loves now tastes off because of the medicines. I encouraged her to continue with all her medications stressing the importance of good BP control.

## 2023-08-25 ENCOUNTER — Ambulatory Visit (HOSPITAL_COMMUNITY)
Admission: RE | Admit: 2023-08-25 | Discharge: 2023-08-25 | Disposition: A | Discharge status: Home / Self Care | Payer: Medicare HMO | Source: Ambulatory Visit | Attending: Internal Medicine | Admitting: Internal Medicine

## 2023-08-25 DIAGNOSIS — R079 Chest pain, unspecified: Secondary | ICD-10-CM | POA: Insufficient documentation

## 2023-08-25 LAB — ECHOCARDIOGRAM COMPLETE
Area-P 1/2: 3.53 cm2
S' Lateral: 3.5 cm

## 2023-08-25 NOTE — Progress Notes (Signed)
*  PRELIMINARY RESULTS* Echocardiogram 2D Echocardiogram has been performed.  Stacey Drain 08/25/2023, 10:24 AM

## 2023-09-04 ENCOUNTER — Ambulatory Visit (HOSPITAL_COMMUNITY): Payer: Medicare HMO

## 2023-09-24 ENCOUNTER — Telehealth: Payer: Self-pay | Admitting: Internal Medicine

## 2023-09-24 NOTE — Telephone Encounter (Signed)
Pt c/o medication issue:  1. Name of Medication:   Coreg 3.125 mg tablet twice daily Chlorthalidone 25 mg tablet once daily  norethindrone (MICRONOR) 0.35 MG tablet (pt unsure if were aware she taking this medication)   2. How are you currently taking this medication (dosage and times per day)?  As written  3. Are you having a reaction (difficulty breathing--STAT)? No  4. What is your medication issue? Patient is calling because she started these medications. Patient has been having headaches and not feeling well. Patient stated her BP is has been running high. Patient stated last night her BP was 180/99.

## 2023-09-29 MED ORDER — AMLODIPINE BESYLATE 5 MG PO TABS: 5.0000 mg | ORAL_TABLET | Freq: Every day | ORAL | 3 refills | Status: AC

## 2023-09-29 NOTE — Telephone Encounter (Signed)
Attempted to contact pt regarding medication start- No answer, no voicemail.   Medication sent to pharmacy per providers request.

## 2023-10-01 NOTE — Telephone Encounter (Signed)
Patient notified and verbalized that she had been taking the Coreg and Chlorthalidone incorrectly. Pt stated that once she realized this and started taking medication as prescribed, her blood pressure improved. Advised pt to continue to monitor bp and let office know if there were any abnormalities. Patient had no further questions or concerns at this time.   Will route to provider as FYI.

## 2023-10-21 ENCOUNTER — Encounter (HOSPITAL_COMMUNITY): Payer: Self-pay

## 2023-10-22 ENCOUNTER — Telehealth (HOSPITAL_COMMUNITY): Payer: Self-pay | Admitting: *Deleted

## 2023-10-22 NOTE — Telephone Encounter (Signed)
Attempted to call patient regarding upcoming cardiac CT appointment. Left message on voicemail with name and callback number Hayley Sharpe RN Navigator Cardiac Imaging [facility] [phone] Office   

## 2023-10-23 ENCOUNTER — Ambulatory Visit (HOSPITAL_COMMUNITY): Admission: RE | Admit: 2023-10-23 | Payer: Medicare HMO | Source: Ambulatory Visit

## 2023-11-16 ENCOUNTER — Telehealth (HOSPITAL_COMMUNITY): Payer: Self-pay | Admitting: *Deleted

## 2023-11-16 NOTE — Telephone Encounter (Signed)
 Reaching out to patient to offer assistance regarding upcoming cardiac imaging study; pt verbalizes understanding of appt date/time, but she wishes to reschedule.  She is having car issues and would like to hold off until March.  New appt made for December 10, 2023. Patient verbalized understanding of appt.  Chase Copping RN Navigator Cardiac Imaging [facility] [phone] office [phone] cell

## 2023-11-17 ENCOUNTER — Ambulatory Visit (HOSPITAL_COMMUNITY): Admission: RE | Admit: 2023-11-17 | Payer: Medicare Other | Source: Ambulatory Visit

## 2023-11-30 ENCOUNTER — Telehealth: Payer: Self-pay | Admitting: Internal Medicine

## 2023-11-30 NOTE — Telephone Encounter (Signed)
 Pt c/o medication issue:  1. Name of Medication:  chlorthalidone (HYGROTON) 25 MG tablet (Expired)   carvedilol (COREG) 3.125 MG tablet   2. How are you currently taking this medication (dosage and times per day)? Has not taken today or yesterday   3. Are you having a reaction (difficulty breathing--STAT)? Yes  4. What is your medication issue? Medications was causing her whole body to ache with her legs being the worst to the point of not being able to get out of bed besides taking her kids to school. Since stopping she has felt better than she has in a long time. She reports she does want to stay on a fluid pill. Her family member she has the same symptoms as, as far as heart related was on Lasix which may work for her. She also states she is on the [facility] incassia norethindrone 0.35 mg tablets that she takes daily, so she does not want an alternative that would effect this. Please advise.

## 2023-11-30 NOTE — Telephone Encounter (Signed)
I will forward to PharmD for review.

## 2023-12-02 MED ORDER — FUROSEMIDE 40 MG PO TABS: 20.0000 mg | ORAL_TABLET | Freq: Every day | ORAL | 2 refills | Status: DC

## 2023-12-02 NOTE — Telephone Encounter (Signed)
 I spoke with patient.She was already on amlodipine since 09/28/24   She will stop chlorthalidone and start lasix 20 mg daily.   She asked if she should continue her coreg and I told her yes,she admitted that she has stopped taking her pm dose. She will now take it BID

## 2023-12-09 ENCOUNTER — Telehealth (HOSPITAL_COMMUNITY): Payer: Self-pay | Admitting: *Deleted

## 2023-12-09 NOTE — Telephone Encounter (Signed)
 Reaching out to patient to offer assistance regarding upcoming cardiac imaging study; pt verbalizes understanding of appt date/time, parking situation and where to check in, pre-test NPO status and medications ordered, and verified current allergies; name and call back number provided for further questions should they arise  Larey Brick RN Navigator Cardiac Imaging [facility] [phone] office [phone] cell  Patient to hold carvedilol and take 100mg  metoprolol tartrate two hours prior to her cardiac CT scan. She is aware to arrive at 9:30 AM.

## 2023-12-10 ENCOUNTER — Ambulatory Visit (HOSPITAL_COMMUNITY)
Admission: RE | Admit: 2023-12-10 | Discharge: 2023-12-10 | Disposition: A | Discharge status: Home / Self Care | Payer: Medicare Other | Source: Ambulatory Visit | Attending: Internal Medicine | Admitting: Internal Medicine

## 2023-12-10 DIAGNOSIS — I2510 Atherosclerotic heart disease of native coronary artery without angina pectoris: Secondary | ICD-10-CM | POA: Insufficient documentation

## 2023-12-10 DIAGNOSIS — R079 Chest pain, unspecified: Secondary | ICD-10-CM | POA: Diagnosis present

## 2023-12-10 MED ORDER — NITROGLYCERIN 0.4 MG SL SUBL
0.8000 mg | SUBLINGUAL_TABLET | Freq: Once | SUBLINGUAL | Status: AC
Start: 2023-12-10 — End: 2023-12-10
  Administered 2023-12-10: 0.8 mg via SUBLINGUAL

## 2023-12-10 MED ORDER — IOHEXOL 350 MG/ML SOLN
95.0000 mL | Freq: Once | INTRAVENOUS | Status: AC | PRN
  Administered 2023-12-10: 95 mL via INTRAVENOUS

## 2023-12-10 MED ORDER — NITROGLYCERIN 0.4 MG SL SUBL
SUBLINGUAL_TABLET | SUBLINGUAL | Status: AC
  Filled 2023-12-10: qty 2

## 2023-12-10 MED ORDER — METOPROLOL TARTRATE 5 MG/5ML IV SOLN
INTRAVENOUS | Status: AC
  Filled 2023-12-10: qty 5

## 2023-12-10 MED ORDER — METOPROLOL TARTRATE 5 MG/5ML IV SOLN
5.0000 mg | Freq: Once | INTRAVENOUS | Status: AC
  Administered 2023-12-10: 5 mg via INTRAVENOUS

## 2023-12-22 ENCOUNTER — Telehealth: Payer: Self-pay

## 2023-12-22 NOTE — Telephone Encounter (Signed)
 Patient called in for results of CTA- gave pt results. Pt stated that she is not currently taking Atorvastatin. Please advise.

## 2024-01-26 ENCOUNTER — Telehealth: Payer: Self-pay

## 2024-01-26 NOTE — Telephone Encounter (Signed)
   Pre-operative Risk Assessment    Patient Name: [name]  DOB: 06-17-1971 MRN: [id number]   Date of last office visit: 08/14/23 Kelly Patient, MD Date of next office visit: NONE  Request for Surgical Clearance    Procedure:  Dental Extraction - Amount of Teeth to be Pulled:  18 TEETH (SURGICAL EXTRACTIONS)  Date of Surgery:  Clearance TBD                                Surgeon:  JUSTIN MCKINLAY, DDS Surgeon's Group or Practice Name:  [facility] Phone number:  [phone] Fax number:  [phone]   Type of Clearance Requested:   - Medical    Type of Anesthesia:   N20 LIDOCAINE , MARCAINE , ALVEO-UR, UL, LL, LR   Additional requests/questions:    Signed, Collin Deal   01/26/2024, 2:18 PM

## 2024-01-26 NOTE — Telephone Encounter (Signed)
   Name: [name]  DOB: Dec 17, 1970  MRN: [id number]  Primary Cardiologist: Lasalle Pointer, MD   Preoperative team, please contact this patient and set up a phone call appointment for further preoperative risk assessment. Please obtain consent and complete medication review. Thank you for your help.  I confirm that guidance regarding antiplatelet and oral anticoagulation therapy has been completed and, if necessary, noted below (none requested).   I also confirmed the patient resides in the state of [place] . As per [facility] Medical Board telemedicine laws, the patient must reside in the state in which the provider is licensed.   Jude Norton, NP 01/26/2024, 3:53 PM [facility]

## 2024-01-28 NOTE — Telephone Encounter (Signed)
 2nd attempt to reach pt, left a message for pt to call back.

## 2024-02-02 NOTE — Telephone Encounter (Signed)
 2nd attempt: Called patient, NA, VM box not set up to leave a message.

## 2024-02-03 NOTE — Telephone Encounter (Signed)
 Pt calling a nurse back

## 2024-02-03 NOTE — Telephone Encounter (Signed)
 Tried returning patient's call no answer unable to leave voicemail.

## 2024-02-05 NOTE — Telephone Encounter (Signed)
 I s/w the pt about dental procedure and tele preop Appt. Pt tells me that she changed insurance companies, but she is going to change back as she is having difficulty with insurance covering for her to get her dental procedure. We left the call with the pt will call back and schedule tele appt when she is ready to schedule for the dental work to be done.  In the mean time I will remove from the preop call back pool .

## 2024-03-17 NOTE — Congregational Nurse Program (Unsigned)
 Ms. [name] visited the [facility] for a blood pressure check. Her blood pressure was elevated, and she stated she had not taken her medication. I advised her to take her medication and recheck her blood pressure using her home monitor.  Florentina Huntsman RN MSN DNP [facility] Nurse

## 2024-08-25 ENCOUNTER — Telehealth: Payer: Self-pay | Admitting: Internal Medicine

## 2024-08-25 NOTE — Telephone Encounter (Signed)
 Patient stated that she has experienced cp 2 days ago. Pt stated that she has been under stress/anxiety lately and believes this may be the cause of her cp. Pt denies PC,SOB N/V. Pt stated that she went to bed and woke up the next morning feeling chest tightness. Pt stated she took nitroglycerin  tablet and felt better.   Pt has appt on 2/18. Will add pt to wait list for sooner appt.

## 2024-08-25 NOTE — Telephone Encounter (Signed)
 Pt c/o of Chest Pain: STAT if active CP, including tightness, pressure, jaw pain, radiating pain to shoulder/upper arm/back, CP unrelieved by Nitro. Symptoms reported of SOB, nausea, vomiting, sweating.   1. Are you having CP right now? No      2. Are you experiencing any other symptoms (ex. SOB, nausea, vomiting, sweating)? No      3. Is your CP continuous or coming and going? Coming and going     4. Have you taken Nitroglycerin ? yes     5. How long have you been experiencing CP? 2 days atleast     6. If NO CP at time of call then end call with telling Pt to call back or call 911 if Chest pain returns prior to return call from triage team.   Pt asked for an appt out soonest is 2/18

## 2024-08-29 NOTE — Telephone Encounter (Signed)
 Spoke to patient who stated she is taking bp medication. Pt stated that she is not taking Coreg  twice daily. When questioned why she does not take medication twice daily, pt stated that she forgets. Advised pt to take medication twice daily as instructed, can set alarm on her phone to remind her to take medication.   Patient verbalized understanding and had no further questions or concerns at this time.

## 2024-09-06 ENCOUNTER — Ambulatory Visit: Admitting: Nurse Practitioner

## 2024-09-08 ENCOUNTER — Telehealth: Payer: Self-pay | Admitting: Internal Medicine

## 2024-09-08 MED ORDER — CARVEDILOL 3.125 MG PO TABS: 3.1250 mg | ORAL_TABLET | Freq: Two times a day (BID) | ORAL | 0 refills | Status: AC

## 2024-09-08 NOTE — Telephone Encounter (Signed)
 Requested Prescriptions   Signed Prescriptions Disp Refills   furosemide  (LASIX ) 40 MG tablet 45 tablet 0    Sig: take 0.5 tablets (20 MILLIGRAM total) by mouth daily.    Authorizing Provider: STACIA DIANNAH SQUIBB    Ordering User: WILFRED, Harli Engelken  C   carvedilol  (COREG ) 3.125 MG tablet 180 tablet 0    Sig: Take 1 tablet (3.125 mg total) by mouth 2 (two) times daily with a meal.    Authorizing Provider: STACIA DIANNAH SQUIBB    Ordering User: Trentan Trippe  C

## 2024-09-08 NOTE — Telephone Encounter (Signed)
*  STAT* If patient is at the pharmacy, call can be transferred to refill team.   1. Which medications need to be refilled? (please list name of each medication and dose if known)   carvedilol  (COREG ) 3.125 MG tablet  furosemide  (LASIX ) 40 MG tablet   2. Would you like to learn more about the convenience, safety, & potential cost savings by using the [facility]?   3. Are you open to using the [facility] (Type [facility]. ).  4. Which pharmacy/location (including street and city if local pharmacy) is medication to be sent to?  [facility] - [place], [place] - [address]   5. Do they need a 30 day or 90 day supply?   Patient stated she is completely out of these medications.  Patient has appointment scheduled on 2/10 with CHARLENA Crate, NP.

## 2024-11-15 ENCOUNTER — Ambulatory Visit: Admitting: Nurse Practitioner

## 2024-11-23 ENCOUNTER — Ambulatory Visit: Admitting: Internal Medicine
